# Patient Record
Sex: Male | Born: 1955 | Race: White | Hispanic: No | Marital: Married | Smoking: Never smoker
Health system: Southern US, Community
[De-identification: ages and names within clinical notes are randomized; demographics above are authoritative.]

## PROBLEM LIST (undated history)

## (undated) DIAGNOSIS — R569 Unspecified convulsions: Secondary | ICD-10-CM

## (undated) DIAGNOSIS — E119 Type 2 diabetes mellitus without complications: Secondary | ICD-10-CM

## (undated) DIAGNOSIS — M1990 Unspecified osteoarthritis, unspecified site: Secondary | ICD-10-CM

## (undated) DIAGNOSIS — M4846XA Fatigue fracture of vertebra, lumbar region, initial encounter for fracture: Secondary | ICD-10-CM

## (undated) HISTORY — PX: FRACTURE SURGERY: SHX138

## (undated) HISTORY — PX: HERNIA REPAIR: SHX51

## (undated) HISTORY — PX: OTHER SURGICAL HISTORY: SHX169

## (undated) HISTORY — DX: Unspecified convulsions: R56.9

## (undated) NOTE — *Deleted (*Deleted)
  Face-to-face evaluation   History: Patient was sent here for possible PICC line infection.  He had a fever of 103.5 yesterday.  He feels generally ill.  Yesterday, infectious disease physician, Dr. Drue Second was contacted regarding possible allergic reaction to antibiotics.  She recommended using hydroxyzine 25 mg every 6 hours as needed itching.  He has been treated as an outpatient with IV antibiotics per PICC line, for knee infection, treated with operative intervention on 08/15/2020.  The patient is using cefazolin, 3 times daily per the PICC line.  He took 2 doses yesterday, skip the evening dose, and took the morning dose today.  He states that these 3 doses were from "a new box."  He states that each time he has had progressively worse reaction, rather immediately after using the medication.  He complains of general tingling, feeling of malaise, and has noticed a progressive rash since the reactions started.  The rash has continued.  He took his first dose of hydroxyzine, this morning prior to the dose of cefazolin.  He also took 2 ibuprofen.  Physical exam: Alert, calm, cooperative.  No respiratory distress.  No dysarthria or aphasia.  He has generalized rash that is consistent with a drug eruption.  The right knee does not appear infected at this time.   Medical screening examination/treatment/procedure(s) were conducted as a shared visit with non-physician practitioner(s) and myself.  I personally evaluated the patient during the encounter

---

## 1976-11-11 HISTORY — PX: FRACTURE SURGERY: SHX138

## 2013-11-11 DIAGNOSIS — Z860100 Personal history of colon polyps, unspecified: Secondary | ICD-10-CM

## 2013-11-11 HISTORY — DX: Personal history of colon polyps, unspecified: Z86.0100

## 2013-11-11 HISTORY — DX: Personal history of colonic polyps: Z86.010

## 2014-01-10 ENCOUNTER — Ambulatory Visit (INDEPENDENT_AMBULATORY_CARE_PROVIDER_SITE_OTHER): Payer: Managed Care, Other (non HMO) | Admitting: Family Medicine

## 2014-01-10 VITALS — BP 130/82 | HR 70 | Temp 98.1°F | Resp 16 | Ht 67.5 in | Wt 258.0 lb

## 2014-01-10 DIAGNOSIS — R059 Cough, unspecified: Secondary | ICD-10-CM

## 2014-01-10 DIAGNOSIS — R05 Cough: Secondary | ICD-10-CM

## 2014-01-10 DIAGNOSIS — J069 Acute upper respiratory infection, unspecified: Secondary | ICD-10-CM

## 2014-01-10 DIAGNOSIS — H938X9 Other specified disorders of ear, unspecified ear: Secondary | ICD-10-CM

## 2014-01-10 MED ORDER — FLUTICASONE PROPIONATE 50 MCG/ACT NA SUSP: 2.0000 | Freq: Every day | NASAL | Status: DC

## 2014-01-10 MED ORDER — AMOXICILLIN-POT CLAVULANATE 875-125 MG PO TABS: 1.0000 | ORAL_TABLET | Freq: Two times a day (BID) | ORAL | Status: DC

## 2014-01-10 NOTE — Patient Instructions (Signed)

## 2014-01-10 NOTE — Progress Notes (Signed)
Chief Complaint:  Chief Complaint  Patient presents with  . Cough    x 1 month   . congestion  . Sore Throat    HPI: Jesus Nunez is a 58 y.o. male who is here for 1 month history of of congestion, sore throat, worsening sxs at night, yellow productive sputum.   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 He is no longer on drugs but does not want any narcotics. Tried otc with minimal releif.   Past Medical History  Diagnosis Date  . Seizures     2012, possibly related to drug use. He currently does not take naything for it. He declines meds and is tired.    Past Surgical History  Procedure Laterality Date  . Fracture surgery    . Hernia repair     History   Social History  . Marital Status: Married    Spouse Name: N/A    Number of Children: N/A  . Years of Education: N/A   Social History Main Topics  . Smoking status: Never Smoker   . Smokeless tobacco: None  . Alcohol Use: No  . Drug Use: No  . Sexual Activity: None   Other Topics Concern  . None   Social History Narrative  . None   Family History  Problem Relation Age of Onset  . Hyperlipidemia Mother   . Mental illness Mother    No Known Allergies Prior to Admission medications   Medication Sig Start Date End Date Taking? Authorizing Provider  Ibuprofen (ADVIL) 200 MG CAPS Take by mouth.   Yes Historical Provider, MD  Omega-3 Fatty Acids (FISH OIL BURP-LESS) 500 MG CAPS Take by mouth.   Yes Historical Provider, MD  Pseudoephedrine-Acetaminophen (SM NON-ASPRIN SINUS PO) Take by mouth.   Yes Historical Provider, MD  thiamine (VITAMIN B-1) 100 MG tablet Take 100 mg by mouth daily.   Yes Historical Provider, MD     ROS: The patient denies fevers, chills, night sweats, unintentional weight loss, chest pain, palpitations, wheezing, dyspnea on exertion, nausea, vomiting, abdominal pain, dysuria, hematuria, melena, numbness, weakness, or tingling.   All other systems have  been reviewed and were otherwise negative with the exception of those mentioned in the HPI and as above.    PHYSICAL EXAM: Filed Vitals:   01/10/14 1215  BP: 130/82  Pulse: 70  Temp: 98.1 F (36.7 C)  Resp: 16  Spo2 95% Filed Vitals:   01/10/14 1215  Height: 5' 7.5" (1.715 m)  Weight: 258 lb (117.028 kg)   Body mass index is 39.79 kg/(m^2).  General: Alert, no acute distress HEENT:  Normocephalic, atraumatic, oropharynx patent. EOMI, PERRLA. Tm bulging. Not infectious. No sinus tenerness Cardiovascular:  Regular rate and rhythm, no rubs murmurs or gallops.  No Carotid bruits, radial pulse intact. No pedal edema.  Respiratory: Clear to auscultation bilaterally.  No wheezes, rales, or rhonchi.  No cyanosis, no use of accessory musculature GI: No organomegaly, abdomen is soft and non-tender, positive bowel sounds.  No masses. Skin: No rashes. Neurologic: Facial musculature symmetric. Psychiatric: Patient is appropriate throughout our interaction. Lymphatic: No cervical lymphadenopathy Musculoskeletal: Gait intact.   LABS: No results found for this or any previous visit.   EKG/XRAY:   [facility]   ASSESSMENT/PLAN: Encounter Diagnoses  Name Primary?  . URI, acute Yes  . Cough   . Ear pressure    Ofered xfay  but declined, he will return if he has worsening sxs, he is not SOB or wheezing. VSS Rx Augmentin, Rx flonase Ot cough meds, prior history of drug use and seizures so will not give any narcotics F/u   Gross sideeffects, risk and benefits, and alternatives of medications d/w patient. Patient is aware that all medications have potential sideeffects and we are unable to predict every sideeffect or drug-drug interaction that may occur.  Hamilton Capri PHUONG, DO 01/10/2014 12:48 PM

## 2014-08-26 ENCOUNTER — Encounter: Payer: Self-pay | Admitting: Gastroenterology

## 2018-07-09 ENCOUNTER — Encounter: Payer: Self-pay | Admitting: Family Medicine

## 2018-07-09 ENCOUNTER — Ambulatory Visit (INDEPENDENT_AMBULATORY_CARE_PROVIDER_SITE_OTHER): Payer: 59 | Admitting: Family Medicine

## 2018-07-09 VITALS — BP 122/80 | HR 73 | Ht 67.5 in | Wt 247.4 lb

## 2018-07-09 DIAGNOSIS — R350 Frequency of micturition: Secondary | ICD-10-CM

## 2018-07-09 DIAGNOSIS — Z0000 Encounter for general adult medical examination without abnormal findings: Secondary | ICD-10-CM | POA: Insufficient documentation

## 2018-07-09 DIAGNOSIS — Z0001 Encounter for general adult medical examination with abnormal findings: Secondary | ICD-10-CM | POA: Diagnosis not present

## 2018-07-09 DIAGNOSIS — E119 Type 2 diabetes mellitus without complications: Secondary | ICD-10-CM

## 2018-07-09 DIAGNOSIS — N401 Enlarged prostate with lower urinary tract symptoms: Secondary | ICD-10-CM | POA: Diagnosis not present

## 2018-07-09 LAB — LIPID PANEL
Cholesterol: 141 mg/dL (ref 0–200)
HDL: 46.1 mg/dL (ref >39.00)
LDL Cholesterol: 81 mg/dL (ref 0–99)
NonHDL: 94.89
Total CHOL/HDL Ratio: 3
Triglycerides: 69 mg/dL (ref 0.0–149.0)
VLDL: 13.8 mg/dL (ref 0.0–40.0)

## 2018-07-09 LAB — URINALYSIS, ROUTINE W REFLEX MICROSCOPIC
Bilirubin Urine: NEGATIVE
Hgb urine dipstick: NEGATIVE
Ketones, ur: NEGATIVE
Leukocytes, UA: NEGATIVE
Nitrite: NEGATIVE
RBC / HPF: NONE SEEN (ref 0–?)
Specific Gravity, Urine: 1.025 (ref 1.000–1.030)
Total Protein, Urine: NEGATIVE
Urine Glucose: NEGATIVE
Urobilinogen, UA: 0.2 (ref 0.0–1.0)
pH: 5.5 (ref 5.0–8.0)

## 2018-07-09 LAB — COMPREHENSIVE METABOLIC PANEL
ALT: 20 U/L (ref 0–53)
AST: 22 U/L (ref 0–37)
Albumin: 4.3 g/dL (ref 3.5–5.2)
Alkaline Phosphatase: 68 U/L (ref 39–117)
BUN: 25 mg/dL — ABNORMAL HIGH (ref 6–23)
CO2: 29 mEq/L (ref 19–32)
Calcium: 9.5 mg/dL (ref 8.4–10.5)
Chloride: 104 mEq/L (ref 96–112)
Creatinine, Ser: 1.02 mg/dL (ref 0.40–1.50)
GFR: 78.71 mL/min (ref >60.00)
Glucose, Bld: 95 mg/dL (ref 70–99)
Potassium: 4.2 mEq/L (ref 3.5–5.1)
Sodium: 139 mEq/L (ref 135–145)
Total Bilirubin: 0.6 mg/dL (ref 0.2–1.2)
Total Protein: 7 g/dL (ref 6.0–8.3)

## 2018-07-09 LAB — PSA: PSA: 1.59 ng/mL (ref 0.10–4.00)

## 2018-07-09 LAB — MICROALBUMIN / CREATININE URINE RATIO
Creatinine,U: 118.2 mg/dL
Microalb Creat Ratio: 2.9 mg/g (ref 0.0–30.0)
Microalb, Ur: 3.4 mg/dL — ABNORMAL HIGH (ref 0.0–1.9)

## 2018-07-09 LAB — HEMOGLOBIN A1C: Hgb A1c MFr Bld: 6.2 % (ref 4.6–6.5)

## 2018-07-09 MED ORDER — METFORMIN HCL ER 500 MG PO TB24: 500.0000 mg | ORAL_TABLET | Freq: Every day | ORAL | 3 refills | Status: DC

## 2018-07-09 MED ORDER — TAMSULOSIN HCL 0.4 MG PO CAPS: 0.4000 mg | ORAL_CAPSULE | Freq: Every day | ORAL | 2 refills | Status: DC

## 2018-07-09 NOTE — Patient Instructions (Signed)
Acute Urinary Retention, Male Acute urinary retention is when you are unable to pee (urinate). Acute urinary retention is common in older men. Prostates can get bigger, which blocks the flow of pee. Follow these instructions at home:  Drink enough fluids to keep your pee clear or pale yellow.  If you are sent home with a tube that drains the bladder (catheter), there will be a drainage bag attached to it. There are two types of bags. One is big that you can wear at night without having to empty it. One is smaller and needs to be emptied more often. ? Keep the drainage bag empty. ? Keep the drainage bag lower than your catheter.  Only take medicine as told by your doctor. Contact a doctor if:  You have a low-grade fever.  You have spasms or you are leaking pee when you have spasms. Get help right away if:  You have chills or a fever.  Your catheter stops draining pee.  Your catheter falls out.  You have increased bleeding that does not stop after you have rested and increased the amount of fluids you had been drinking. This information is not intended to replace advice given to you by your health care provider. Make sure you discuss any questions you have with your health care provider. Document Released: 04/15/2008 Document Revised: 04/04/2016 Document Reviewed: 04/08/2013 Elsevier Interactive Patient Education  2017 [employer]  Diabetes Mellitus and Exercise Exercising regularly is important for your overall health, especially when you have diabetes (diabetes mellitus). Exercising is not only about losing weight. It has many health benefits, such as increasing muscle strength and bone density and reducing body fat and stress. This leads to improved fitness, flexibility, and endurance, all of which result in better overall health. Exercise has additional benefits for people with diabetes, including:  Reducing appetite.  Helping to lower and control blood glucose.  Lowering  blood pressure.  Helping to control amounts of fatty substances (lipids) in the blood, such as cholesterol and triglycerides.  Helping the body to respond better to insulin (improving insulin sensitivity).  Reducing how much insulin the body needs.  Decreasing the risk for heart disease by: ? Lowering cholesterol and triglyceride levels. ? Increasing the levels of good cholesterol. ? Lowering blood glucose levels.  What is my activity plan? Your health care provider or certified diabetes educator can help you make a plan for the type and frequency of exercise (activity plan) that works for you. Make sure that you:  Do at least 150 minutes of moderate-intensity or vigorous-intensity exercise each week. This could be brisk walking, biking, or water aerobics. ? Do stretching and strength exercises, such as yoga or weightlifting, at least 2 times a week. ? Spread out your activity over at least 3 days of the week.  Get some form of physical activity every day. ? Do not go more than 2 days in a row without some kind of physical activity. ? Avoid being inactive for more than 90 minutes at a time. Take frequent breaks to walk or stretch.  Choose a type of exercise or activity that you enjoy, and set realistic goals.  Start slowly, and gradually increase the intensity of your exercise over time.  What do I need to know about managing my diabetes?  Check your blood glucose before and after exercising. ? If your blood glucose is higher than 240 mg/dL (16.1 mmol/L) before you exercise, check your urine for ketones. If you have ketones in your  urine, do not exercise until your blood glucose returns to normal.  Know the symptoms of low blood glucose (hypoglycemia) and how to treat it. Your risk for hypoglycemia increases during and after exercise. Common symptoms of hypoglycemia can include: ? Hunger. ? Anxiety. ? Sweating and feeling clammy. ? Confusion. ? Dizziness or feeling  light-headed. ? Increased heart rate or palpitations. ? Blurry vision. ? Tingling or numbness around the mouth, lips, or tongue. ? Tremors or shakes. ? Irritability.  Keep a rapid-acting carbohydrate snack available before, during, and after exercise to help prevent or treat hypoglycemia.  Avoid injecting insulin into areas of the body that are going to be exercised. For example, avoid injecting insulin into: ? The arms, when playing tennis. ? The legs, when jogging.  Keep records of your exercise habits. Doing this can help you and your health care provider adjust your diabetes management plan as needed. Write down: ? Food that you eat before and after you exercise. ? Blood glucose levels before and after you exercise. ? The type and amount of exercise you have done. ? When your insulin is expected to peak, if you use insulin. Avoid exercising at times when your insulin is peaking.  When you start a new exercise or activity, work with your health care provider to make sure the activity is safe for you, and to adjust your insulin, medicines, or food intake as needed.  Drink plenty of water while you exercise to prevent dehydration or heat stroke. Drink enough fluid to keep your urine clear or pale yellow. This information is not intended to replace advice given to you by your health care provider. Make sure you discuss any questions you have with your health care provider. Document Released: 01/18/2004 Document Revised: 05/17/2016 Document Reviewed: 04/08/2016 Elsevier Interactive Patient Education  2018 [employer].  Health Maintenance, Male A healthy lifestyle and preventive care is important for your health and wellness. Ask your health care provider about what schedule of regular examinations is right for you. What should I know about weight and diet? Eat a Healthy Diet  Eat plenty of vegetables, fruits, whole grains, low-fat dairy products, and lean protein.  Do not eat a lot  of foods high in solid fats, added sugars, or salt.  Maintain a Healthy Weight Regular exercise can help you achieve or maintain a healthy weight. You should:  Do at least 150 minutes of exercise each week. The exercise should increase your heart rate and make you sweat (moderate-intensity exercise).  Do strength-training exercises at least twice a week.  Watch Your Levels of Cholesterol and Blood Lipids  Have your blood tested for lipids and cholesterol every 5 years starting at 62 years of age. If you are at high risk for heart disease, you should start having your blood tested when you are 62 years old. You may need to have your cholesterol levels checked more often if: ? Your lipid or cholesterol levels are high. ? You are older than 62 years of age. ? You are at high risk for heart disease.  What should I know about cancer screening? Many types of cancers can be detected early and may often be prevented. Lung Cancer  You should be screened every year for lung cancer if: ? You are a current smoker who has smoked for at least 30 years. ? You are a former smoker who has quit within the past 15 years.  Talk to your health care provider about your screening options, when  you should start screening, and how often you should be screened.  Colorectal Cancer  Routine colorectal cancer screening usually begins at 62 years of age and should be repeated every 5-10 years until you are 62 years old. You may need to be screened more often if early forms of precancerous polyps or small growths are found. Your health care provider may recommend screening at an earlier age if you have risk factors for colon cancer.  Your health care provider may recommend using home test kits to check for hidden blood in the stool.  A small camera at the end of a tube can be used to examine your colon (sigmoidoscopy or colonoscopy). This checks for the earliest forms of colorectal cancer.  Prostate and  Testicular Cancer  Depending on your age and overall health, your health care provider may do certain tests to screen for prostate and testicular cancer.  Talk to your health care provider about any symptoms or concerns you have about testicular or prostate cancer.  Skin Cancer  Check your skin from head to toe regularly.  Tell your health care provider about any new moles or changes in moles, especially if: ? There is a change in a mole's size, shape, or color. ? You have a mole that is larger than a pencil eraser.  Always use sunscreen. Apply sunscreen liberally and repeat throughout the day.  Protect yourself by wearing long sleeves, pants, a wide-brimmed hat, and sunglasses when outside.  What should I know about heart disease, diabetes, and high blood pressure?  If you are 618-62 years of age, have your blood pressure checked every 3-5 years. If you are 62 years of age or older, have your blood pressure checked every year. You should have your blood pressure measured twice-once when you are at a hospital or clinic, and once when you are not at a hospital or clinic. Record the average of the two measurements. To check your blood pressure when you are not at a hospital or clinic, you can use: ? An automated blood pressure machine at a pharmacy. ? A home blood pressure monitor.  Talk to your health care provider about your target blood pressure.  If you are between 4545-62 years old, ask your health care provider if you should take aspirin to prevent heart disease.  Have regular diabetes screenings by checking your fasting blood sugar level. ? If you are at a normal weight and have a low risk for diabetes, have this test once every three years after the age of 62. ? If you are overweight and have a high risk for diabetes, consider being tested at a younger age or more often.  A one-time screening for abdominal aortic aneurysm (AAA) by ultrasound is recommended for men aged 65-75 years  who are current or former smokers. What should I know about preventing infection? Hepatitis B If you have a higher risk for hepatitis B, you should be screened for this virus. Talk with your health care provider to find out if you are at risk for hepatitis B infection. Hepatitis C Blood testing is recommended for:  Everyone born from [id number] through 1965.  Anyone with known risk factors for hepatitis C.  Sexually Transmitted Diseases (STDs)  You should be screened each year for STDs including gonorrhea and chlamydia if: ? You are sexually active and are younger than 62 years of age. ? You are older than 62 years of age and your health care provider tells you that you are  at risk for this type of infection. ? Your sexual activity has changed since you were last screened and you are at an increased risk for chlamydia or gonorrhea. Ask your health care provider if you are at risk.  Talk with your health care provider about whether you are at high risk of being infected with HIV. Your health care provider may recommend a prescription medicine to help prevent HIV infection.  What else can I do?  Schedule regular health, dental, and eye exams.  Stay current with your vaccines (immunizations).  Do not use any tobacco products, such as cigarettes, chewing tobacco, and e-cigarettes. If you need help quitting, ask your health care provider.  Limit alcohol intake to no more than 2 drinks per day. One drink equals 12 ounces of beer, 5 ounces of wine, or 1 ounces of hard liquor.  Do not use street drugs.  Do not share needles.  Ask your health care provider for help if you need support or information about quitting drugs.  Tell your health care provider if you often feel depressed.  Tell your health care provider if you have ever been abused or do not feel safe at home. This information is not intended to replace advice given to you by your health care provider. Make sure you discuss any  questions you have with your health care provider. Document Released: 04/25/2008 Document Revised: 06/26/2016 Document Reviewed: 08/01/2015 Elsevier Interactive Patient Education  2018 [employer].  How to Increase Your Level of Physical Activity Getting regular physical activity is important for your overall health and well-being. Most people do not get enough exercise. There are easy ways to increase your level of physical activity, even if you have not been very active in the past or you are just starting out. Why is physical activity important? Physical activity has many short-term and long-term health benefits. Regular exercise can:  Help you lose weight or maintain a healthy weight.  Strengthen your muscles and bones.  Boost your mood and improve self-esteem.  Reduce your risk of certain long-term (chronic) diseases, like heart disease, cancer, and diabetes.  Help you stay capable of walking and moving around (mobile) as you age.  Prevent accidents, such as falls, as you age.  Increase life expectancy.  What are the benefits of being physically active on a regular basis? In addition to improving your physical health, being physically active on most days of the week can help you in ways that you may not expect. Benefits of regular physical activity may include:  Feeling good about your body.  Being able to move around more easily and for longer periods of time without getting tired (increased stamina).  Finding new sources of fun and enjoyment.  Meeting new people who share a common interest.  Being able to fight off illness better (enhanced immunity).  Being able to sleep better.  What can happen if I am not physically active on a regular basis? Not getting enough physical activity can lead to an unhealthy lifestyle and future health problems. This can increase your chances of:  Becoming overweight or obese.  Becoming sick.  Developing chronic illnesses, like  heart disease or diabetes.  Having mental health problems, like depression or anxiety.  Having sleep problems.  Having trouble walking or getting yourself around (reduced mobility).  Injuring yourself in a fall as you get older.  What steps can I take to be more physically active?  Check with your health care provider about how to  get started. Ask your health care provider what activities are safe for you.  Start out slowly. Walking or doing some simple chair exercises is a good place to start, especially if you have not been active before or for a long time.  Try to find activities that you enjoy. You are more likely to commit to an exercise routine if it does not feel like a chore.  If you have bone or joint problems, choose low-impact exercises, like walking or swimming.  Include physical activity in your everyday routine.  Invite friends or family members to exercise with you. This also will help you commit to your workout plan.  Set goals that you can work toward.  Aim for at least 150 minutes of moderate-intensity exercise each week. Examples of moderate-intensity exercise include walking or riding a bike. Where to find more information:  Centers for Disease Control and Prevention: JokeRule.co.uk  [employer] on [employer] www.http://smith-thompson.com/  ChooseMyPlate: MissedFlights.com.br Contact a health care provider if:  You have headaches, muscle aches, or joint pain.  You feel dizzy or light-headed while exercising.  You faint.  You have chest pain while exercising. Summary  Exercise benefits your mind and body at any age, even if you are just starting out.  If you have a chronic illness or have not been active for a while, check with your health care provider before increasing your physical activity.  Choose activities that are safe and enjoyable for you.Ask your health care provider  what activities are safe for you.  Start slowly. Tell your health care provider if you have problems as you start to increase your activity level. This information is not intended to replace advice given to you by your health care provider. Make sure you discuss any questions you have with your health care provider. Document Released: 10/17/2016 Document Revised: 10/17/2016 Document Reviewed: 10/17/2016 Elsevier Interactive Patient Education  [employer].

## 2018-07-09 NOTE — Progress Notes (Signed)
Subjective:  Patient ID: Jesus Nunez, male    DOB: 1956/08/26  Age: 62 y.o. MRN: [id number]  CC: Establish Care   HPI Kashius Dominic presents for a physical exam.  He is fasting today.  He is an established patient of mine.  Diabetes has been well controlled on 500 mg of Glucophage Exar daily.  He does admit urinary frequency and decreased force of flow with nocturia 2-3 times a night.  He continues to work full-time and lives with his wife.  He is happy that his son is home from [place] and no longer subject to sniper fire.  He does not smoke or drink alcohol or use illicit drugs.  He is not currently exercising other than and on his job.  His last colonoscopy was 5 years ago.  He is actually been able to lose some weight since I saw him last.  Outpatient Medications Prior to Visit  Medication Sig Dispense Refill  . Ibuprofen (ADVIL) 200 MG CAPS Take by mouth.    . Omega-3 Fatty Acids (FISH OIL BURP-LESS) 500 MG CAPS Take by mouth.    . metFORMIN (GLUCOPHAGE-XR) 500 MG 24 hr tablet Take 1 tablet by mouth daily.  2  . amoxicillin-clavulanate (AUGMENTIN) 875-125 MG per tablet Take 1 tablet by mouth 2 (two) times daily. 20 tablet 0  . fluticasone (FLONASE) 50 MCG/ACT nasal spray Place 2 sprays into both nostrils daily. 16 g 0  . Pseudoephedrine-Acetaminophen (SM NON-ASPRIN SINUS PO) Take by mouth.    . thiamine (VITAMIN B-1) 100 MG tablet Take 100 mg by mouth daily.     No facility-administered medications prior to visit.     ROS Review of Systems  Constitutional: Negative for chills, diaphoresis, fatigue, fever and unexpected weight change.  HENT: Negative.   Eyes: Negative for photophobia and visual disturbance.  Respiratory: Negative.   Cardiovascular: Negative.   Gastrointestinal: Negative.   Endocrine: Negative for polyphagia and polyuria.  Genitourinary: Positive for frequency. Negative for decreased urine volume, difficulty urinating and dysuria.  Musculoskeletal: Positive for  gait problem. Negative for joint swelling.  Allergic/Immunologic: Negative for immunocompromised state.  Neurological: Negative for weakness, light-headedness and headaches.  Hematological: Does not bruise/bleed easily.  Psychiatric/Behavioral: Negative.     Objective:  BP 122/80   Pulse 73   Ht 5' 7.5" (1.715 m)   Wt 247 lb 6 oz (112.2 kg)   SpO2 96%   BMI 38.17 kg/m   BP Readings from Last 3 Encounters:  07/09/18 122/80  01/10/14 130/82    Wt Readings from Last 3 Encounters:  07/09/18 247 lb 6 oz (112.2 kg)  01/10/14 258 lb (117 kg)    Physical Exam  Constitutional: He is oriented to person, place, and time. He appears well-developed and well-nourished. No distress.  HENT:  Head: Normocephalic and atraumatic.  Right Ear: External ear normal.  Left Ear: External ear normal.  Mouth/Throat: Oropharynx is clear and moist. No oropharyngeal exudate.  Eyes: Pupils are equal, round, and reactive to light. Conjunctivae and EOM are normal. Right eye exhibits no discharge. Left eye exhibits no discharge. No scleral icterus.  Neck: Neck supple. No JVD present. No tracheal deviation present. No thyromegaly present.  Cardiovascular: Normal rate, regular rhythm and normal heart sounds.  Pulmonary/Chest: Effort normal and breath sounds normal.  Abdominal: Soft. Bowel sounds are normal.  Genitourinary: Rectum normal and prostate normal. Rectal exam shows no external hemorrhoid, no internal hemorrhoid, no fissure, no mass, no tenderness, anal tone normal and guaiac  negative stool. Prostate is not enlarged and not tender.  Lymphadenopathy:    He has no cervical adenopathy.  Neurological: He is alert and oriented to person, place, and time.  Skin: Skin is warm and dry. Capillary refill takes less than 2 seconds. He is not diaphoretic.  Psychiatric: He has a normal mood and affect. His behavior is normal.    No results found for: WBC, HGB, HCT, PLT, GLUCOSE, CHOL, TRIG, HDL, LDLDIRECT,  LDLCALC, ALT, AST, NA, K, CL, CREATININE, BUN, CO2, TSH, PSA, INR, GLUF, HGBA1C, MICROALBUR  Patient was never admitted.  Assessment & Plan:   Hessie Dienerlan was seen today for establish care.  Diagnoses and all orders for this visit:  Encounter for health maintenance examination with abnormal findings -     Comprehensive metabolic panel -     Lipid panel -     PSA -     Urinalysis, Routine w reflex microscopic  Benign prostatic hyperplasia with urinary frequency -     PSA -     tamsulosin (FLOMAX) 0.4 MG CAPS capsule; Take 1 capsule (0.4 mg total) by mouth daily.  Controlled type 2 diabetes mellitus without complication, without long-term current use of insulin (HCC) -     Comprehensive metabolic panel -     Hemoglobin A1c -     Urinalysis, Routine w reflex microscopic -     Microalbumin / creatinine urine ratio -     metFORMIN (GLUCOPHAGE-XR) 500 MG 24 hr tablet; Take 1 tablet (500 mg total) by mouth daily.   I have discontinued Hessie DienerAlan Ruddick's thiamine, Pseudoephedrine-Acetaminophen (SM NON-ASPRIN SINUS PO), amoxicillin-clavulanate, and fluticasone. I have also changed his metFORMIN. Additionally, I am having him start on tamsulosin. Lastly, I am having him maintain his FISH OIL BURP-LESS and Ibuprofen.  Meds ordered this encounter  Medications  . metFORMIN (GLUCOPHAGE-XR) 500 MG 24 hr tablet    Sig: Take 1 tablet (500 mg total) by mouth daily.    Dispense:  90 tablet    Refill:  3  . tamsulosin (FLOMAX) 0.4 MG CAPS capsule    Sig: Take 1 capsule (0.4 mg total) by mouth daily.    Dispense:  90 capsule    Refill:  2   Patient was given anticipatory guidance on health promotion and maintenance.  He was also given information about exercising to lose weight and help control diabetes he agreed to give Flomax to try for his BPH symptoms.  Information was given to him also regarding acute urinary retention.  He will follow-up in 6 months or sooner as needed.  Follow-up: Return in about 6  months (around 01/09/2019).  Mliss SaxWilliam Alfred Kremer, MD

## 2018-07-10 ENCOUNTER — Encounter: Payer: Self-pay | Admitting: Family Medicine

## 2019-03-03 ENCOUNTER — Telehealth: Payer: Self-pay | Admitting: Family Medicine

## 2019-03-03 NOTE — Telephone Encounter (Signed)
I called and left message on patient voicemail to call office and schedule follow up appointment with Dr. Kremer.  °

## 2019-07-05 ENCOUNTER — Telehealth: Payer: Self-pay | Admitting: Family Medicine

## 2019-07-05 NOTE — Telephone Encounter (Signed)
I called and spoke with pt. Form has been filled out & faxed back to [facility]. Appointment not needed with [place] at this time. Pt verbalized understanding.

## 2019-07-05 NOTE — Telephone Encounter (Signed)
I have a call into Dr. Aurea Graff office to receive another copy of the surgical clearance paperwork. Will notify pt if he needs appointment once we receive the paperwork.

## 2019-07-05 NOTE — Telephone Encounter (Signed)
Patient is calling to check on pre surgical clearance paperwork that was faxed by Dr. Elliot Cousin from [facility].   Can paperwork be completed and return back or will a OV be needed?  Please advise - [phone]

## 2019-07-15 NOTE — H&P (Signed)
UNICOMPARTMENTAL KNEE ADMISSION H&P  Patient is being admitted for right medial unicompartmental knee arthroplasty.  Subjective:  Chief Complaint:  Right knee medial compartmental primary OA /pain    HPI: Jesus Nunez, 63 y.o. male , has a history of pain and functional disability in the right and has failed non-surgical conservative treatments for greater than 12 weeks to include NSAID's and/or analgesics, use of assistive devices and activity modification.  Onset of symptoms was gradual, starting  years ago with gradually worsening course since that time. The patient noted no past surgery on the right knee(s).  Patient currently rates pain in the right knee(s) at 7 out of 10 with activity. Patient has worsening of pain with activity and weight bearing, pain that interferes with activities of daily living, pain with passive range of motion, crepitus and joint swelling.  Patient has evidence of periarticular osteophytes and joint space narrowing of the medial compartment by imaging studies.  There is no active infection.  Risks, benefits and expectations were discussed with the patient.  Risks including but not limited to the risk of anesthesia, blood clots, nerve damage, blood vessel damage, failure of the prosthesis, infection and up to and including death.  Patient understand the risks, benefits and expectations and wishes to proceed with surgery.   PCP: [name], MD  D/C Plans:       Home (same day)  Post-op Meds:       No Rx given   Tranexamic Acid:      To be given - IV   Decadron:      Is to be given  FYI:      ASA  Norco  DME:   Rx for RW and 3-n-1 sent  PT:   OPPT  Pharmacy: [facility]    Past Medical History:  Diagnosis Date  . 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 He currently does not take naything for it. He declines meds and is tired.      Past Surgical History:  Procedure Laterality Date  . FRACTURE SURGERY     . HERNIA REPAIR      No Known Allergies  Social History   Tobacco Use  . Smoking status: Never Smoker  . Smokeless tobacco: Never Used  Substance Use Topics  . Alcohol use: No  . Drug use: No    Family History  Problem Relation Age of Onset  . Hyperlipidemia Mother   . Mental illness Mother      Review of Systems  Constitutional: Negative.   HENT: Negative.   Eyes: Negative.   Respiratory: Negative.   Cardiovascular: Negative.   Gastrointestinal: Negative.   Genitourinary: Negative.   Musculoskeletal: Positive for joint pain.  Skin: Negative.   Neurological: Negative.   Endo/Heme/Allergies: Negative.   Psychiatric/Behavioral: Negative.      Objective:   Physical Exam  Constitutional: He is oriented to person, place, and time. He appears well-developed.  HENT:  Head: Normocephalic.  Eyes: Pupils are equal, round, and reactive to light.  Neck: Neck supple. No JVD present. No tracheal deviation present. No thyromegaly present.  Cardiovascular: Normal rate, regular rhythm and intact distal pulses.  Respiratory: Effort normal and breath sounds normal. No respiratory distress. He has no wheezes.  GI: Soft. There is no abdominal tenderness. There is no guarding.  Musculoskeletal:     Right knee: He exhibits swelling and bony tenderness. He exhibits no ecchymosis, no deformity, no  laceration and no erythema. Tenderness found. Medial joint line tenderness noted. No lateral joint line tenderness noted.  Lymphadenopathy:    He has no cervical adenopathy.  Neurological: He is alert and oriented to person, place, and time.  Skin: Skin is warm and dry.  Psychiatric: He has a normal mood and affect.      Labs:  Estimated body mass index is 38.17 kg/m as calculated from the following:   Height as of 07/09/18: 5' 7.5" (1.715 m).   Weight as of 07/09/18: 112.2 kg.   Imaging Review Plain radiographs demonstrate severe degenerative joint disease of the right knee(s)  medial compartment.  The bone quality appears to be good for age and reported activity level.  Assessment/Plan:  End stage arthritis, right knee medial compartment  The patient history, physical examination, clinical judgment of the provider and imaging studies are consistent with end stage degenerative joint disease of the right knee(s) and medial unicompartmental knee arthroplasty is deemed medically necessary. The treatment options including medical management, injection therapy arthroscopy and arthroplasty were discussed at length. The risks and benefits of total knee arthroplasty were presented and reviewed. The risks due to aseptic loosening, infection, stiffness, patella tracking problems, thromboembolic complications and other imponderables were discussed. The patient acknowledged the explanation, agreed to proceed with the plan and consent was signed. Patient is being admitted for outpatient / observation treatment for surgery, pain control, PT, OT, prophylactic antibiotics, VTE prophylaxis, progressive ambulation and ADL's and discharge planning. The patient is planning to be discharged home.     Anastasio AuerbachMatthew S. Lachrisha Ziebarth   PA-C  07/15/2019, 10:40 PM

## 2019-07-21 ENCOUNTER — Other Ambulatory Visit: Payer: Self-pay

## 2019-07-21 DIAGNOSIS — E119 Type 2 diabetes mellitus without complications: Secondary | ICD-10-CM

## 2019-07-21 MED ORDER — METFORMIN HCL ER 500 MG PO TB24: 500.0000 mg | ORAL_TABLET | Freq: Every day | ORAL | 3 refills | Status: DC

## 2019-07-22 NOTE — Patient Instructions (Addendum)
DUE TO COVID-19 ONLY ONE VISITOR IS ALLOWED TO COME WITH YOU AND STAY IN THE WAITING ROOM ONLY DURING PRE OP AND PROCEDURE DAY OF SURGERY. THE 1 VISITOR MAY VISIT WITH YOU AFTER SURGERY IN YOUR PRIVATE ROOM DURING VISITING HOURS ONLY!  YOU NEED TO HAVE A COVID 19 TEST ON_ 07-26-2019 AT 245 PM. , THIS TEST MUST BE DONE BEFORE SURGERY, COME  [address], [place]  , [phone].  [facility]) ONCE YOUR COVID TEST IS COMPLETED, PLEASE BEGIN THE QUARANTINE INSTRUCTIONS AS OUTLINED IN YOUR HANDOUT.                Jesus Nunez    Your procedure is scheduled on: Thursday 07/29/2019   Report to [facility] Main  Entr[facility]              Report to admitting at 0610  AM     Call this number if you have problems the morning of surgery [phone]  How to Manage Your Diabetes Before and After Surgery  Why is it important to control my blood sugar before and after surgery? . Improving blood sugar levels before and after surgery helps healing and can limit problems. . A way of improving blood sugar control is eating a healthy diet by: o  Eating less sugar and carbohydrates o  Increasing activity/exercise o  Talking with your doctor about reaching your blood sugar goals . High blood sugars (greater than 180 mg/dL) can raise your risk of infections and slow your recovery, so you will need to focus on controlling your diabetes during the weeks before surgery. . Make sure that the doctor who takes care of your diabetes knows about your planned surgery including the date and location.  How do I manage my blood sugar before surgery? . Check your blood sugar at least 4 times a day, starting 2 days before surgery, to make sure that the level is not too high or low. o Check your blood sugar the morning of your surgery when you wake up and every 2 hours until you get to the Short Stay unit. . If your blood sugar is less than 70 mg/dL, you will need to treat for low blood sugar: o Do  not take insulin. o Treat a low blood sugar (less than 70 mg/dL) with  cup of clear juice (cranberry or apple), 4 glucose tablets, OR glucose gel. o Recheck blood sugar in 15 minutes after treatment (to make sure it is greater than 70 mg/dL). If your blood sugar is not greater than 70 mg/dL on recheck, call [phone] for further instructions. . Report your blood sugar to the short stay nurse when you get to Short Stay.  . If you are admitted to the hospital after surgery: o Your blood sugar will be checked by the staff and you will probably be given insulin after surgery (instead of oral diabetes medicines) to make sure you have good blood sugar levels. o The goal for blood sugar control after surgery is 80-180 mg/dL.   WHAT DO I DO ABOUT MY DIABETES MEDICATION?         The day before surgery, Take Metformin as usual.  . Do not take oral diabetes medicines (pills) the morning of surgery.     Remember: Do not eat food  :After Midnight.               NO SOLID FOOD AFTER MIDNIGHT THE NIGHT PRIOR TO SURGERY. NOTHING BY MOUTH EXCEPT  CLEAR LIQUIDS FROM MIDNIGHT UP  UNTIL   0540 am .               PLEASE FINISH G2  DRINK PER SURGEON ORDER  WHICH NEEDS TO BE COMPLETED AT  0540 am .   CLEAR LIQUID DIET   Foods Allowed                                                                     Foods Excluded  Coffee and tea, regular and decaf                             liquids that you cannot  Plain Jell-O any favor except red or purple                                           see through such as: Fruit ices (not with fruit pulp)                                     milk, soups, orange juice  Iced Popsicles                                    All solid food Carbonated beverages, regular and diet                                    Cranberry, grape and apple juices Sports drinks like Gatorade Lightly seasoned clear broth or consume(fat free) Sugar, honey syrup  Sample Menu Breakfast                                 Lunch                                     Supper Cranberry juice                    Beef broth                            Chicken broth Jell-O                                     Grape juice                           Apple juice Coffee or tea                        Jell-O  Popsicle                                                Coffee or tea                        Coffee or tea  _____________________________________________________________________                 BRUSH YOUR TEETH MORNING OF SURGERY AND RINSE YOUR MOUTH OUT, NO CHEWING GUM CANDY OR MINTS.     Take these medicines the morning of surgery with A SIP OF WATER: None              DO NOT TAKE ANY DIABETIC MEDICATIONS DAY OF YOUR SURGERY!                               You may not have any metal on your body including hair pins and              piercings  Do not wear jewelry, make-up, lotions, powders or perfumes, deodorant                        Men may shave face and neck.   Do not bring valuables to the hospital. [place].  Contacts, dentures or bridgework may not be worn into surgery.  Leave suitcase in the car. After surgery it may be brought to your room.                  Please read over the following fact sheets you were given: _____________________________________________________________________             [facility] - Preparing for Surgery Before surgery, you can play an important role.  Because skin is not sterile, your skin needs to be as free of germs as possible.  You can reduce the number of germs on your skin by washing with CHG (chlorahexidine gluconate) soap before surgery.  CHG is an antiseptic cleaner which kills germs and bonds with the skin to continue killing germs even after washing. Please DO NOT use if you have an allergy to CHG or antibacterial soaps.  If your skin becomes  reddened/irritated stop using the CHG and inform your nurse when you arrive at Short Stay. Do not shave (including legs and underarms) for at least 48 hours prior to the first CHG shower.  You may shave your face/neck. Please follow these instructions carefully:  1.  Shower with CHG Soap the night before surgery and the  morning of Surgery.  2.  If you choose to wash your hair, wash your hair first as usual with your  normal  shampoo.  3.  After you shampoo, rinse your hair and body thoroughly to remove the  shampoo.                           4.  Use CHG as you would any other liquid soap.  You can apply chg directly  to the skin and wash  Gently with a scrungie or clean washcloth.  5.  Apply the CHG Soap to your body ONLY FROM THE NECK DOWN.   Do not use on face/ open                           Wound or open sores. Avoid contact with eyes, ears mouth and genitals (private parts).                       Wash face,  Genitals (private parts) with your normal soap.             6.  Wash thoroughly, paying special attention to the area where your surgery  will be performed.  7.  Thoroughly rinse your body with warm water from the neck down.  8.  DO NOT shower/wash with your normal soap after using and rinsing off  the CHG Soap.                9.  Pat yourself dry with a clean towel.            10.  Wear clean pajamas.            11.  Place clean sheets on your bed the night of your first shower and do not  sleep with pets. Day of Surgery : Do not apply any lotions/deodorants the morning of surgery.  Please wear clean clothes to the hospital/surgery center.  FAILURE TO FOLLOW THESE INSTRUCTIONS MAY RESULT IN THE CANCELLATION OF YOUR SURGERY PATIENT SIGNATURE_________________________________  NURSE SIGNATURE__________________________________  ________________________________________________________________________   Adam Phenix  An incentive spirometer is a tool that  can help keep your lungs clear and active. This tool measures how well you are filling your lungs with each breath. Taking long deep breaths may help reverse or decrease the chance of developing breathing (pulmonary) problems (especially infection) following:  A long period of time when you are unable to move or be active. BEFORE THE PROCEDURE   If the spirometer includes an indicator to show your best effort, your nurse or respiratory therapist will set it to a desired goal.  If possible, sit up straight or lean slightly forward. Try not to slouch.  Hold the incentive spirometer in an upright position. INSTRUCTIONS FOR USE  1. Sit on the edge of your bed if possible, or sit up as far as you can in bed or on a chair. 2. Hold the incentive spirometer in an upright position. 3. Breathe out normally. 4. Place the mouthpiece in your mouth and seal your lips tightly around it. 5. Breathe in slowly and as deeply as possible, raising the piston or the ball toward the top of the column. 6. Hold your breath for 3-5 seconds or for as long as possible. Allow the piston or ball to fall to the bottom of the column. 7. Remove the mouthpiece from your mouth and breathe out normally. 8. Rest for a few seconds and repeat Steps 1 through 7 at least 10 times every 1-2 hours when you are awake. Take your time and take a few normal breaths between deep breaths. 9. The spirometer may include an indicator to show your best effort. Use the indicator as a goal to work toward during each repetition. 10. After each set of 10 deep breaths, practice coughing to be sure your lungs are clear. If you have an incision (the cut made at the time of surgery),  support your incision when coughing by placing a pillow or rolled up towels firmly against it. Once you are able to get out of bed, walk around indoors and cough well. You may stop using the incentive spirometer when instructed by your caregiver.  RISKS AND  COMPLICATIONS  Take your time so you do not get dizzy or light-headed.  If you are in pain, you may need to take or ask for pain medication before doing incentive spirometry. It is harder to take a deep breath if you are having pain. AFTER USE  Rest and breathe slowly and easily.  It can be helpful to keep track of a log of your progress. Your caregiver can provide you with a simple table to help with this. If you are using the spirometer at home, follow these instructions: [place] IF:   You are having difficultly using the spirometer.  You have trouble using the spirometer as often as instructed.  Your pain medication is not giving enough relief while using the spirometer.  You develop fever of 100.5 F (38.1 C) or higher. SEEK IMMEDIATE MEDICAL CARE IF:   You cough up bloody sputum that had not been present before.  You develop fever of 102 F (38.9 C) or greater.  You develop worsening pain at or near the incision site. MAKE SURE YOU:   Understand these instructions.  Will watch your condition.  Will get help right away if you are not doing well or get worse. Document Released: 03/10/2007 Document Revised: 01/20/2012 Document Reviewed: 05/11/2007 Exit[employer] Patient Information 2014 Exit[employer], [place].   ________________________________________________________________________  WHAT IS A BLOOD TRANSFUSION? Blood Transfusion Information  A transfusion is the replacement of blood or some of its parts. Blood is made up of multiple cells which provide different functions.  Red blood cells carry oxygen and are used for blood loss replacement.  White blood cells fight against infection.  Platelets control bleeding.  Plasma helps clot blood.  Other blood products are available for specialized needs, such as hemophilia or other clotting disorders. BEFORE THE TRANSFUSION  Who gives blood for transfusions?   Healthy volunteers who are fully evaluated to make sure  their blood is safe. This is blood bank blood. Transfusion therapy is the safest it has ever been in the practice of medicine. Before blood is taken from a donor, a complete history is taken to make sure that person has no history of diseases nor engages in risky social behavior (examples are intravenous drug use or sexual activity with multiple partners). The donor's travel history is screened to minimize risk of transmitting infections, such as malaria. The donated blood is tested for signs of infectious diseases, such as HIV and hepatitis. The blood is then tested to be sure it is compatible with you in order to minimize the chance of a transfusion reaction. If you or a relative donates blood, this is often done in anticipation of surgery and is not appropriate for emergency situations. It takes many days to process the donated blood. RISKS AND COMPLICATIONS Although transfusion therapy is very safe and saves many lives, the main dangers of transfusion include:   Getting an infectious disease.  Developing a transfusion reaction. This is an allergic reaction to something in the blood you were given. Every precaution is taken to prevent this. The decision to have a blood transfusion has been considered carefully by your caregiver before blood is given. Blood is not given unless the benefits outweigh the risks. AFTER THE TRANSFUSION  Right after receiving a blood transfusion, you will usually feel much better and more energetic. This is especially true if your red blood cells have gotten low (anemic). The transfusion raises the level of the red blood cells which carry oxygen, and this usually causes an energy increase.  The nurse administering the transfusion will monitor you carefully for complications. HOME CARE INSTRUCTIONS  No special instructions are needed after a transfusion. You may find your energy is better. Speak with your caregiver about any limitations on activity for underlying diseases  you may have. SEEK MEDICAL CARE IF:   Your condition is not improving after your transfusion.  You develop redness or irritation at the intravenous (IV) site. SEEK IMMEDIATE MEDICAL CARE IF:  Any of the following symptoms occur over the next 12 hours:  Shaking chills.  You have a temperature by mouth above 102 F (38.9 C), not controlled by medicine.  Chest, back, or muscle pain.  People around you feel you are not acting correctly or are confused.  Shortness of breath or difficulty breathing.  Dizziness and fainting.  You get a rash or develop hives.  You have a decrease in urine output.  Your urine turns a dark color or changes to pink, red, or brown. Any of the following symptoms occur over the next 10 days:  You have a temperature by mouth above 102 F (38.9 C), not controlled by medicine.  Shortness of breath.  Weakness after normal activity.  The white part of the eye turns yellow (jaundice).  You have a decrease in the amount of urine or are urinating less often.  Your urine turns a dark color or changes to pink, red, or brown. Document Released: 10/25/2000 Document Revised: 01/20/2012 Document Reviewed: 06/13/2008 [facility] Patient Information 2014 [place], [place].  _______________________________________________________________________

## 2019-07-23 ENCOUNTER — Encounter (HOSPITAL_COMMUNITY)
Admission: RE | Admit: 2019-07-23 | Discharge: 2019-07-23 | Disposition: A | Discharge status: Home / Self Care | Payer: 59 | Source: Ambulatory Visit | Attending: Orthopedic Surgery | Admitting: Orthopedic Surgery

## 2019-07-23 ENCOUNTER — Encounter (HOSPITAL_COMMUNITY): Payer: Self-pay

## 2019-07-23 ENCOUNTER — Other Ambulatory Visit: Payer: Self-pay

## 2019-07-23 DIAGNOSIS — E119 Type 2 diabetes mellitus without complications: Secondary | ICD-10-CM | POA: Insufficient documentation

## 2019-07-23 DIAGNOSIS — M1711 Unilateral primary osteoarthritis, right knee: Secondary | ICD-10-CM | POA: Insufficient documentation

## 2019-07-23 DIAGNOSIS — Z01818 Encounter for other preprocedural examination: Secondary | ICD-10-CM | POA: Insufficient documentation

## 2019-07-23 DIAGNOSIS — R001 Bradycardia, unspecified: Secondary | ICD-10-CM | POA: Diagnosis not present

## 2019-07-23 HISTORY — DX: Fatigue fracture of vertebra, lumbar region, initial encounter for fracture: M48.46XA

## 2019-07-23 HISTORY — DX: Unspecified osteoarthritis, unspecified site: M19.90

## 2019-07-23 HISTORY — DX: Type 2 diabetes mellitus without complications: E11.9

## 2019-07-23 LAB — BASIC METABOLIC PANEL
Anion gap: 6 (ref 5–15)
BUN: 30 mg/dL — ABNORMAL HIGH (ref 8–23)
CO2: 27 mmol/L (ref 22–32)
Calcium: 9.3 mg/dL (ref 8.9–10.3)
Chloride: 106 mmol/L (ref 98–111)
Creatinine, Ser: 1.07 mg/dL (ref 0.61–1.24)
GFR calc Af Amer: >60 mL/min (ref >60)
GFR calc non Af Amer: >60 mL/min (ref >60)
Glucose, Bld: 89 mg/dL (ref 70–99)
Potassium: 4.2 mmol/L (ref 3.5–5.1)
Sodium: 139 mmol/L (ref 135–145)

## 2019-07-23 LAB — GLUCOSE, CAPILLARY: Glucose-Capillary: 88 mg/dL (ref 70–99)

## 2019-07-23 LAB — CBC
HCT: 50.1 % (ref 39.0–52.0)
Hemoglobin: 16.4 g/dL (ref 13.0–17.0)
MCH: 30.1 pg (ref 26.0–34.0)
MCHC: 32.7 g/dL (ref 30.0–36.0)
MCV: 91.9 fL (ref 80.0–100.0)
Platelets: 253 10*3/uL (ref 150–400)
RBC: 5.45 MIL/uL (ref 4.22–5.81)
RDW: 13.2 % (ref 11.5–15.5)
WBC: 7.6 10*3/uL (ref 4.0–10.5)
nRBC: 0 % (ref 0.0–0.2)

## 2019-07-23 LAB — SURGICAL PCR SCREEN
MRSA, PCR: NEGATIVE
Staphylococcus aureus: POSITIVE — AB

## 2019-07-23 NOTE — Progress Notes (Signed)
PCP - DR Abelino Derrick Cardiologist - NONE  Chest x-ray - NONE EKG - 07-23-19 EPIC Stress Test - NONE ECHO - NONE Cardiac Cath - NONE  Sleep Study - NONE CPAP - NONE  Fasting Blood Sugar -  Checks Blood Sugar - DOES NOT CHECK CBG  Blood Thinner Instructions: Aspirin Instructions: 81 MG Last Dose:07-21-19 PER DR Alvan Dame INSTRUCTIONS  Anesthesia review:   Patient denies shortness of breath, fever, cough and chest pain at PAT appointment   Patient verbalized understanding of instructions that were given to them at the PAT appointment. Patient was also instructed that they will need to review over the PAT instructions again at home before surgery.

## 2019-07-24 LAB — ABO/RH: ABO/RH(D): O POS

## 2019-07-24 LAB — HEMOGLOBIN A1C
Hgb A1c MFr Bld: 6 % — ABNORMAL HIGH (ref 4.8–5.6)
Mean Plasma Glucose: 126 mg/dL

## 2019-07-26 ENCOUNTER — Other Ambulatory Visit (HOSPITAL_COMMUNITY)
Admission: RE | Admit: 2019-07-26 | Discharge: 2019-07-26 | Disposition: A | Discharge status: Home / Self Care | Payer: 59 | Source: Ambulatory Visit | Attending: Orthopedic Surgery | Admitting: Orthopedic Surgery

## 2019-07-26 DIAGNOSIS — Z01812 Encounter for preprocedural laboratory examination: Secondary | ICD-10-CM | POA: Diagnosis present

## 2019-07-26 DIAGNOSIS — Z20828 Contact with and (suspected) exposure to other viral communicable diseases: Secondary | ICD-10-CM | POA: Diagnosis not present

## 2019-07-27 LAB — NOVEL CORONAVIRUS, NAA (HOSP ORDER, SEND-OUT TO REF LAB; TAT 18-24 HRS): SARS-CoV-2, NAA: NOT DETECTED

## 2019-07-28 NOTE — Anesthesia Preprocedure Evaluation (Addendum)
Anesthesia Evaluation  Patient identified by MRN, date of birth, ID band Patient awake    Reviewed: Allergy & Precautions, NPO status , Patient's Chart, lab work & pertinent test results  History of Anesthesia Complications Negative for: history of anesthetic complications  Airway Mallampati: II  TM Distance: >3 FB Neck ROM: Full    Dental  (+) Dental Advisory Given, Loose   Pulmonary neg pulmonary ROS,    Pulmonary exam normal        Cardiovascular negative cardio ROS Normal cardiovascular exam     Neuro/Psych Seizures -, Well Controlled,  negative psych ROS   GI/Hepatic negative GI ROS, Neg liver ROS,   Endo/Other  diabetes, Type 2, Oral Hypoglycemic Agents Obesity   Renal/GU negative Renal ROS     Musculoskeletal  (+) Arthritis ,  Hx lumbar stress fracture    Abdominal (+) + obese,   Peds  Hematology negative hematology ROS (+)   Anesthesia Other Findings   Reproductive/Obstetrics                            Anesthesia Physical Anesthesia Plan  ASA: II  Anesthesia Plan: Spinal   Post-op Pain Management:  Regional for Post-op pain   Induction:   PONV Risk Score and Plan: 1 and Treatment may vary due to age or medical condition and Propofol infusion  Airway Management Planned: Natural Airway and Simple Face Mask  Additional Equipment: None  Intra-op Plan:   Post-operative Plan:   Informed Consent: I have reviewed the patients History and Physical, chart, labs and discussed the procedure including the risks, benefits and alternatives for the proposed anesthesia with the patient or authorized representative who has indicated his/her understanding and acceptance.       Plan Discussed with: CRNA and Anesthesiologist  Anesthesia Plan Comments: (Labs reviewed, platelets acceptable. Discussed risks and benefits of spinal, including spinal/epidural hematoma, infection,  failed block, and PDPH. Patient expressed understanding and wished to proceed. )       Anesthesia Quick Evaluation

## 2019-07-29 ENCOUNTER — Encounter (HOSPITAL_COMMUNITY)
Admission: RE | Disposition: A | Discharge status: Home / Self Care | Payer: Self-pay | Source: Other Acute Inpatient Hospital | Attending: Orthopedic Surgery

## 2019-07-29 ENCOUNTER — Other Ambulatory Visit: Payer: Self-pay

## 2019-07-29 ENCOUNTER — Ambulatory Visit (HOSPITAL_COMMUNITY)
Admission: RE | Admit: 2019-07-29 | Discharge: 2019-07-29 | Disposition: A | Discharge status: Home / Self Care | Payer: 59 | Source: Other Acute Inpatient Hospital | Attending: Orthopedic Surgery | Admitting: Orthopedic Surgery

## 2019-07-29 ENCOUNTER — Ambulatory Visit (HOSPITAL_COMMUNITY): Payer: 59 | Admitting: Physician Assistant

## 2019-07-29 ENCOUNTER — Encounter (HOSPITAL_COMMUNITY): Payer: Self-pay | Admitting: Registered Nurse

## 2019-07-29 ENCOUNTER — Ambulatory Visit (HOSPITAL_COMMUNITY): Payer: 59 | Admitting: Anesthesiology

## 2019-07-29 DIAGNOSIS — M1711 Unilateral primary osteoarthritis, right knee: Secondary | ICD-10-CM | POA: Diagnosis not present

## 2019-07-29 DIAGNOSIS — E669 Obesity, unspecified: Secondary | ICD-10-CM | POA: Diagnosis not present

## 2019-07-29 DIAGNOSIS — Z7984 Long term (current) use of oral hypoglycemic drugs: Secondary | ICD-10-CM | POA: Insufficient documentation

## 2019-07-29 DIAGNOSIS — Z6838 Body mass index (BMI) 38.0-38.9, adult: Secondary | ICD-10-CM | POA: Insufficient documentation

## 2019-07-29 DIAGNOSIS — E119 Type 2 diabetes mellitus without complications: Secondary | ICD-10-CM | POA: Insufficient documentation

## 2019-07-29 DIAGNOSIS — Z96651 Presence of right artificial knee joint: Secondary | ICD-10-CM

## 2019-07-29 HISTORY — PX: PARTIAL KNEE ARTHROPLASTY: SHX2174

## 2019-07-29 LAB — TYPE AND SCREEN
ABO/RH(D): O POS
Antibody Screen: NEGATIVE

## 2019-07-29 LAB — GLUCOSE, CAPILLARY: Glucose-Capillary: 123 mg/dL — ABNORMAL HIGH (ref 70–99)

## 2019-07-29 SURGERY — ARTHROPLASTY, KNEE, UNICOMPARTMENTAL
Anesthesia: Spinal | Site: Knee | Laterality: Right

## 2019-07-29 MED ORDER — POVIDONE-IODINE 10 % EX SWAB
2.0000 "application " | Freq: Once | CUTANEOUS | Status: AC
  Administered 2019-07-29: 2 via TOPICAL

## 2019-07-29 MED ORDER — METHOCARBAMOL 500 MG PO TABS: 500.0000 mg | ORAL_TABLET | Freq: Four times a day (QID) | ORAL | 0 refills | Status: DC | PRN

## 2019-07-29 MED ORDER — DOCUSATE SODIUM 100 MG PO CAPS: 100.0000 mg | ORAL_CAPSULE | Freq: Two times a day (BID) | ORAL | 0 refills | Status: DC

## 2019-07-29 MED ORDER — SODIUM CHLORIDE (PF) 0.9 % IJ SOLN
INTRAMUSCULAR | Status: DC | PRN
  Administered 2019-07-29: 30 mL

## 2019-07-29 MED ORDER — DEXAMETHASONE SODIUM PHOSPHATE 10 MG/ML IJ SOLN: 10.0000 mg | Freq: Once | INTRAMUSCULAR | Status: DC

## 2019-07-29 MED ORDER — KETOROLAC TROMETHAMINE 30 MG/ML IJ SOLN
INTRAMUSCULAR | Status: AC
  Filled 2019-07-29: qty 1

## 2019-07-29 MED ORDER — MIDAZOLAM HCL 2 MG/2ML IJ SOLN
INTRAMUSCULAR | Status: AC
  Filled 2019-07-29: qty 2

## 2019-07-29 MED ORDER — FENTANYL CITRATE (PF) 100 MCG/2ML IJ SOLN
INTRAMUSCULAR | Status: AC
  Filled 2019-07-29: qty 2

## 2019-07-29 MED ORDER — SODIUM CHLORIDE (PF) 0.9 % IJ SOLN
INTRAMUSCULAR | Status: AC
  Filled 2019-07-29: qty 30

## 2019-07-29 MED ORDER — OXYCODONE HCL 5 MG PO TABS: 5.0000 mg | ORAL_TABLET | Freq: Once | ORAL | Status: DC | PRN

## 2019-07-29 MED ORDER — OXYCODONE HCL 5 MG/5ML PO SOLN: 5.0000 mg | Freq: Once | ORAL | Status: DC | PRN

## 2019-07-29 MED ORDER — FENTANYL CITRATE (PF) 100 MCG/2ML IJ SOLN
50.0000 ug | INTRAMUSCULAR | Status: DC
  Administered 2019-07-29: 50 ug via INTRAVENOUS
  Filled 2019-07-29: qty 2

## 2019-07-29 MED ORDER — 0.9 % SODIUM CHLORIDE (POUR BTL) OPTIME
TOPICAL | Status: DC | PRN
  Administered 2019-07-29: 1000 mL

## 2019-07-29 MED ORDER — CHLORHEXIDINE GLUCONATE 4 % EX LIQD: 60.0000 mL | Freq: Once | CUTANEOUS | Status: DC

## 2019-07-29 MED ORDER — FERROUS SULFATE 325 (65 FE) MG PO TABS: 325.0000 mg | ORAL_TABLET | Freq: Three times a day (TID) | ORAL | 0 refills | Status: DC

## 2019-07-29 MED ORDER — CEFAZOLIN SODIUM-DEXTROSE 2-4 GM/100ML-% IV SOLN
2.0000 g | INTRAVENOUS | Status: AC
  Administered 2019-07-29: 2 g via INTRAVENOUS
  Filled 2019-07-29: qty 100

## 2019-07-29 MED ORDER — HYDROCODONE-ACETAMINOPHEN 7.5-325 MG PO TABS: 1.0000 | ORAL_TABLET | ORAL | 0 refills | Status: DC | PRN

## 2019-07-29 MED ORDER — BUPIVACAINE-EPINEPHRINE (PF) 0.5% -1:200000 IJ SOLN
INTRAMUSCULAR | Status: AC
  Filled 2019-07-29: qty 1.8

## 2019-07-29 MED ORDER — VANCOMYCIN HCL 1000 MG IV SOLR
INTRAVENOUS | Status: DC | PRN
  Administered 2019-07-29: 1000 mg

## 2019-07-29 MED ORDER — MIDAZOLAM HCL 2 MG/2ML IJ SOLN
1.0000 mg | INTRAMUSCULAR | Status: DC
  Administered 2019-07-29: 2 mg via INTRAVENOUS
  Filled 2019-07-29: qty 2

## 2019-07-29 MED ORDER — KETOROLAC TROMETHAMINE 30 MG/ML IJ SOLN
INTRAMUSCULAR | Status: DC | PRN
  Administered 2019-07-29: 30 mg via INTRAVENOUS

## 2019-07-29 MED ORDER — ONDANSETRON HCL 4 MG/2ML IJ SOLN
INTRAMUSCULAR | Status: DC | PRN
  Administered 2019-07-29: 4 mg via INTRAVENOUS

## 2019-07-29 MED ORDER — HYDROCODONE-ACETAMINOPHEN 7.5-325 MG PO TABS: 1.0000 | ORAL_TABLET | Freq: Four times a day (QID) | ORAL | Status: DC | PRN

## 2019-07-29 MED ORDER — FENTANYL CITRATE (PF) 100 MCG/2ML IJ SOLN: 25.0000 ug | INTRAMUSCULAR | Status: DC | PRN

## 2019-07-29 MED ORDER — BUPIVACAINE IN DEXTROSE 0.75-8.25 % IT SOLN
INTRATHECAL | Status: DC | PRN
  Administered 2019-07-29: 1.6 mL via INTRATHECAL

## 2019-07-29 MED ORDER — PROMETHAZINE HCL 25 MG/ML IJ SOLN: 6.2500 mg | INTRAMUSCULAR | Status: DC | PRN

## 2019-07-29 MED ORDER — PROPOFOL 500 MG/50ML IV EMUL
INTRAVENOUS | Status: DC | PRN
  Administered 2019-07-29: 25 ug/kg/min via INTRAVENOUS

## 2019-07-29 MED ORDER — PROPOFOL 10 MG/ML IV BOLUS
INTRAVENOUS | Status: AC
  Filled 2019-07-29: qty 80

## 2019-07-29 MED ORDER — POLYETHYLENE GLYCOL 3350 17 G PO PACK: 17.0000 g | PACK | Freq: Two times a day (BID) | ORAL | 0 refills | Status: DC

## 2019-07-29 MED ORDER — HYDROMORPHONE HCL 1 MG/ML IJ SOLN: 0.5000 mg | INTRAMUSCULAR | Status: DC | PRN

## 2019-07-29 MED ORDER — STERILE WATER FOR IRRIGATION IR SOLN
Status: DC | PRN
  Administered 2019-07-29: 2000 mL

## 2019-07-29 MED ORDER — DEXAMETHASONE SODIUM PHOSPHATE 10 MG/ML IJ SOLN
INTRAMUSCULAR | Status: DC | PRN
  Administered 2019-07-29: 10 mg via INTRAVENOUS

## 2019-07-29 MED ORDER — ASPIRIN 81 MG PO CHEW: 81.0000 mg | CHEWABLE_TABLET | Freq: Two times a day (BID) | ORAL | 0 refills | Status: AC

## 2019-07-29 MED ORDER — BUPIVACAINE-EPINEPHRINE 0.5% -1:200000 IJ SOLN
INTRAMUSCULAR | Status: AC
  Filled 2019-07-29: qty 1

## 2019-07-29 MED ORDER — LIDOCAINE 2% (20 MG/ML) 5 ML SYRINGE
INTRAMUSCULAR | Status: AC
  Filled 2019-07-29: qty 5

## 2019-07-29 MED ORDER — TRANEXAMIC ACID-NACL 1000-0.7 MG/100ML-% IV SOLN
1000.0000 mg | Freq: Once | INTRAVENOUS | Status: AC
  Administered 2019-07-29: 1000 mg via INTRAVENOUS
  Filled 2019-07-29: qty 100

## 2019-07-29 MED ORDER — DEXAMETHASONE SODIUM PHOSPHATE 10 MG/ML IJ SOLN
INTRAMUSCULAR | Status: AC
  Filled 2019-07-29: qty 1

## 2019-07-29 MED ORDER — ONDANSETRON HCL 4 MG/2ML IJ SOLN
INTRAMUSCULAR | Status: AC
  Filled 2019-07-29: qty 2

## 2019-07-29 MED ORDER — VANCOMYCIN HCL 1000 MG IV SOLR
INTRAVENOUS | Status: AC
  Filled 2019-07-29: qty 1000

## 2019-07-29 MED ORDER — SODIUM CHLORIDE 0.9 % IV BOLUS
250.0000 mL | Freq: Once | INTRAVENOUS | Status: AC
  Administered 2019-07-29: 250 mL via INTRAVENOUS

## 2019-07-29 MED ORDER — LIDOCAINE 2% (20 MG/ML) 5 ML SYRINGE
INTRAMUSCULAR | Status: DC | PRN
  Administered 2019-07-29: 60 mg via INTRAVENOUS

## 2019-07-29 MED ORDER — ROPIVACAINE HCL 7.5 MG/ML IJ SOLN
INTRAMUSCULAR | Status: DC | PRN
  Administered 2019-07-29: 20 mL via PERINEURAL

## 2019-07-29 MED ORDER — LACTATED RINGERS IV SOLN
INTRAVENOUS | Status: DC
  Administered 2019-07-29 (×2): via INTRAVENOUS

## 2019-07-29 MED ORDER — TRANEXAMIC ACID-NACL 1000-0.7 MG/100ML-% IV SOLN
1000.0000 mg | INTRAVENOUS | Status: AC
  Administered 2019-07-29: 1000 mg via INTRAVENOUS
  Filled 2019-07-29: qty 100

## 2019-07-29 MED ORDER — MIDAZOLAM HCL 5 MG/5ML IJ SOLN
INTRAMUSCULAR | Status: DC | PRN
  Administered 2019-07-29 (×2): 1 mg via INTRAVENOUS

## 2019-07-29 MED ORDER — BUPIVACAINE-EPINEPHRINE 0.5% -1:200000 IJ SOLN
INTRAMUSCULAR | Status: DC | PRN
  Administered 2019-07-29: 30 mL

## 2019-07-29 MED ORDER — FENTANYL CITRATE (PF) 100 MCG/2ML IJ SOLN
INTRAMUSCULAR | Status: DC | PRN
  Administered 2019-07-29 (×2): 50 ug via INTRAVENOUS

## 2019-07-29 SURGICAL SUPPLY — 48 items
BAG ZIPLOCK 12X15 (MISCELLANEOUS) IMPLANT
BEARING MENISCAL TIBIAL 4 MD R (Orthopedic Implant) ×2 IMPLANT
BLADE SAW RECIPROCATING 77.5 (BLADE) ×3 IMPLANT
BLADE SAW SGTL 13.0X1.19X90.0M (BLADE) ×3 IMPLANT
BNDG ELASTIC 6X15 VLCR STRL LF (GAUZE/BANDAGES/DRESSINGS) ×2 IMPLANT
BNDG ELASTIC 6X5.8 VLCR STR LF (GAUZE/BANDAGES/DRESSINGS) ×3 IMPLANT
BOWL SMART MIX CTS (DISPOSABLE) ×3 IMPLANT
CEMENT BONE R 1X40 (Cement) ×3 IMPLANT
COMPONENT TIBIAL OXFRD MEDL RT (Orthopedic Implant) IMPLANT
COVER SURGICAL LIGHT HANDLE (MISCELLANEOUS) ×3 IMPLANT
COVER WAND RF STERILE (DRAPES) IMPLANT
CUFF TOURN SGL QUICK 34 (TOURNIQUET CUFF) ×2
CUFF TRNQT CYL 34X4.125X (TOURNIQUET CUFF) ×1 IMPLANT
DECANTER SPIKE VIAL GLASS SM (MISCELLANEOUS) ×6 IMPLANT
DERMABOND ADVANCED (GAUZE/BANDAGES/DRESSINGS) ×2
DERMABOND ADVANCED .7 DNX12 (GAUZE/BANDAGES/DRESSINGS) ×1 IMPLANT
DRAPE U-SHAPE 47X51 STRL (DRAPES) ×3 IMPLANT
DRESSING AQUACEL AG SP 3.5X10 (GAUZE/BANDAGES/DRESSINGS) ×1 IMPLANT
DRSG AQUACEL AG SP 3.5X10 (GAUZE/BANDAGES/DRESSINGS) ×3
DURAPREP 26ML APPLICATOR (WOUND CARE) ×3 IMPLANT
ELECT REM PT RETURN 15FT ADLT (MISCELLANEOUS) ×3 IMPLANT
GLOVE BIOGEL PI IND STRL 7.5 (GLOVE) ×1 IMPLANT
GLOVE BIOGEL PI IND STRL 8.5 (GLOVE) ×1 IMPLANT
GLOVE BIOGEL PI INDICATOR 7.5 (GLOVE) ×2
GLOVE BIOGEL PI INDICATOR 8.5 (GLOVE) ×2
GLOVE ECLIPSE 8.0 STRL XLNG CF (GLOVE) ×3 IMPLANT
GLOVE ORTHO TXT STRL SZ7.5 (GLOVE) ×6 IMPLANT
GOWN STRL REUS W/TWL 2XL LVL3 (GOWN DISPOSABLE) ×3 IMPLANT
GOWN STRL REUS W/TWL LRG LVL3 (GOWN DISPOSABLE) ×3 IMPLANT
HOLDER FOLEY CATH W/STRAP (MISCELLANEOUS) ×2 IMPLANT
KIT TURNOVER KIT A (KITS) IMPLANT
LEGGING LITHOTOMY PAIR STRL (DRAPES) ×3 IMPLANT
MANIFOLD NEPTUNE II (INSTRUMENTS) ×3 IMPLANT
NDL SAFETY ECLIPSE 18X1.5 (NEEDLE) ×1 IMPLANT
NEEDLE HYPO 18GX1.5 SHARP (NEEDLE) ×2
PACK ICE MAXI GEL EZY WRAP (MISCELLANEOUS) ×1 IMPLANT
PACK TOTAL KNEE CUSTOM (KITS) ×3 IMPLANT
PEG TWIN FEM CEMENTED MED (Knees) ×2 IMPLANT
SUT MNCRL AB 4-0 PS2 18 (SUTURE) ×3 IMPLANT
SUT STRATAFIX PDS+ 0 24IN (SUTURE) ×3 IMPLANT
SUT VIC AB 1 CT1 36 (SUTURE) ×3 IMPLANT
SUT VIC AB 2-0 CT1 27 (SUTURE) ×4
SUT VIC AB 2-0 CT1 TAPERPNT 27 (SUTURE) ×2 IMPLANT
SYR 3ML LL SCALE MARK (SYRINGE) ×3 IMPLANT
TIBIAL OXFORD MEDIAL RT (Orthopedic Implant) ×3 IMPLANT
TRAY FOLEY MTR SLVR 16FR STAT (SET/KITS/TRAYS/PACK) ×3 IMPLANT
WRAP KNEE MAXI GEL POST OP (GAUZE/BANDAGES/DRESSINGS) ×2 IMPLANT
YANKAUER SUCT BULB TIP NO VENT (SUCTIONS) ×3 IMPLANT

## 2019-07-29 NOTE — Interval H&P Note (Signed)
History and Physical Interval Note:  07/29/2019 7:13 AM  Jesus Nunez  has presented today for surgery, with the diagnosis of Right knee medial osteoarthritis.  The various methods of treatment have been discussed with the patient and family. After consideration of risks, benefits and other options for treatment, the patient has consented to  Procedure(s) with comments: UNICOMPARTMENTAL KNEE Medially (Right) - 90 mins as a surgical intervention.  The patient's history has been reviewed, patient examined, no change in status, stable for surgery.  I have reviewed the patient's chart and labs.  Questions were answered to the patient's satisfaction.     Mauri Pole

## 2019-07-29 NOTE — Transfer of Care (Signed)
Immediate Anesthesia Transfer of Care Note  Patient: Estanislao Harmon  Procedure(s) Performed: UNICOMPARTMENTAL KNEE Medially (Right Knee)  Patient Location: PACU  Anesthesia Type:Spinal  Level of Consciousness: awake, alert  and oriented  Airway & Oxygen Therapy: Patient Spontanous Breathing and Patient connected to face mask oxygen  Post-op Assessment: Report given to RN and Post -op Vital signs reviewed and stable  Post vital signs: Reviewed and stable  Last Vitals:  Vitals Value Taken Time  BP    Temp    Pulse 67 07/29/19 1035  Resp 10 07/29/19 1035  SpO2 99 % 07/29/19 1035  Vitals shown include unvalidated device data.  Last Pain:  Vitals:   07/29/19 0643  TempSrc: Oral  PainSc:       Patients Stated Pain Goal: 4 (20/60/15 6153)  Complications: No apparent anesthesia complications

## 2019-07-29 NOTE — Progress Notes (Signed)
Assisted Dr. Brock with right, ultrasound guided, adductor canal block. Side rails up, monitors on throughout procedure. See vital signs in flow sheet. Tolerated Procedure well.  

## 2019-07-29 NOTE — Anesthesia Procedure Notes (Signed)
Spinal  Patient location during procedure: OR Start time: 07/29/2019 8:25 AM End time: 07/29/2019 8:30 AM Staffing Anesthesiologist: Audry Pili, MD Resident/CRNA: Talbot Grumbling, CRNA Performed: resident/CRNA  Preanesthetic Checklist Completed: patient identified, surgical consent, pre-op evaluation, timeout performed, IV checked, risks and benefits discussed and monitors and equipment checked Spinal Block Patient position: sitting Prep: DuraPrep Patient monitoring: heart rate, cardiac monitor, continuous pulse ox and blood pressure Approach: midline Location: L3-4 Injection technique: single-shot Needle Needle type: Pencan  Needle gauge: 24 G Needle length: 9 cm Assessment Sensory level: T4 Additional Notes Clear CSF, no paresthesia, patient tolerated well.

## 2019-07-29 NOTE — Anesthesia Procedure Notes (Signed)
Anesthesia Regional Block: Adductor canal block   Pre-Anesthetic Checklist: ,, timeout performed, Correct Patient, Correct Site, Correct Laterality, Correct Procedure, Correct Position, site marked, Risks and benefits discussed,  Surgical consent,  Pre-op evaluation,  At surgeon's request and post-op pain management  Laterality: Right  Prep: chloraprep       Needles:  Injection technique: Single-shot  Needle Type: Echogenic Needle     Needle Length: 10cm  Needle Gauge: 21     Additional Needles:   Narrative:  Start time: 07/29/2019 7:57 AM End time: 07/29/2019 8:01 AM Injection made incrementally with aspirations every 5 mL.  Performed by: Personally  Anesthesiologist: Audry Pili, MD  Additional Notes: No pain on injection. No increased resistance to injection. Injection made in 5cc increments. Good needle visualization. Patient tolerated the procedure well.

## 2019-07-29 NOTE — Op Note (Signed)
NAME: Jesus Nunez    MEDICAL RECORD NO.: [id number]   FACILITY: [facility] OF BIRTH: [birth year]  PHYSICIAN: Pietro Cassis. Alvan Dame, M.D.    DATE OF PROCEDURE: 07/29/2019    OPERATIVE REPORT   PREOPERATIVE DIAGNOSIS: Right knee medial compartment osteoarthritis.   POSTOPERATIVE DIAGNOSIS: Right knee medial compartment osteoarthritis.   PROCEDURE: Right partial knee replacement utilizing Biomet Oxford knee  component, size MEDIUM femur, a right medial size B tibial tray with a size 4 mm insert.   SURGEON: Pietro Cassis. Alvan Dame, M.D.   ASSISTANT: Danae Orleans, PAC.  Please note that Mr. Guinevere Scarlet was present for the entirety of the case,  utilized for preoperative positioning, perioperative retractor  management, general facilitation of the case and primary wound closure.   ANESTHESIA: regional and spinal.   SPECIMENS: None.   COMPLICATIONS: None.  DRAINS: None   TOURNIQUET TIME: 37 minutes at 250 mmHg.   INDICATIONS FOR PROCEDURE: The patient is a 63 y.o. patient of mine who presented for evaluation of right knee pain.  They presented with primary complaints of pain on the medial side of their knee. Radiographs revealed advanced medial compartment arthritis with specifically an antero-medial wear pattern.  There was bone on bone changes noted with subchondral sclerosis and osteophytes present. The patient has had progressive problems failing to respond to conservative measures of medications, injections and activity modification. Risks of infection, DVT, component failure, need for future revision surgery were all discussed and reviewed.  Consent was obtained for benefit of pain relief.   PROCEDURE IN DETAIL: The patient was brought to the operative theater.  Once adequate anesthesia, preoperative antibiotics, 2 gm of Ancef administered, 1 gm of Tranexamic Acid, and 10 mg of Decadron given the patient was positioned in supine position with a rigt thigh tourniquet placed. The operative leg  was positioned over the Oxford leg holder such to allow for 120 degrees of flexion. The right lower extremity was prepped and draped in sterile fashion. A time-out  was performed identifying the patient, planned procedure, and extremity.  The operative leg was exsanguinated and the tourniquet elevated to 250 mmHg. A midline  incision was made from the proximal pole of the patella to the tibial tubercle. A  soft tissue plane was created and partial median arthrotomy was then  made to allow for subluxation of the patella. Following initial synovectomy and  debridement, the osteophytes were removed off the medial aspect of the knee and within the notch as needed.  The femur was first sized using the sizing spoons and determine to be a medium femur.  The femoral spoon was then left in place to be attached to the extra medullary tibial guide.  The tibial extramedullary guide was positioned over the anterior crest of the tibia  and pinned into position, perpendicular in the coronal plane with appropriate slope.  Utilizing the size 4 G clamp I connected the cutting block tray to the femoral spine.  Upon confirming the orientation of the cutting block as it pertained to the extra medullary guide the tibial tray was pinned in place.  I then used a reciprocating saw along the medial border of the medial tibial spine.  The reciprocating saw was then used to complete the proximal tibial osteotomy.  The proximal tibial bone was removed and identified to have complete loss of cartilage and anterior medial pattern.  The cut surface of the tibia was found to be best covered with the B tibial tray.  At this point  I checked the tension of the medial collateral ligament at 90 degrees.  With the retractors out of the wound and the knee held at 90 degrees the 4 feeler gauge had appropriate tension on the medial ligament.   At this point, the femoral canal was opened with a 6 mm drill followed by the starting awl and then  the  intramedullary was positioned within the canal.  Then the medium posterior cutting block guide set at 4 to match the flexion gap was positioned onto the cut surface of the tibia and then linked to the intramedullary rod using the articulating link.  This posterior cutting block guide was positioned over the middle portion of the medial femoral condyle in line with the normal kinematic motion of the knee.  The 2 drill holes were then made into the distal femur.  Once this was done the posterior cutting block guide and length were removed.    The posterior guide was then impacted into place on the distal femur and the posterior  femoral cut made.   At this point, based on the tension on the medial collateral ligament at 90 degrees as previously assessed I selected the size 4 spigot and pushed it to the distal femur.  I then milled the distal femur.  Excess of bone from the distal femur and then medially and laterally were removed using osteotome and rondure.  We now did our first trial reduction with the medium femur and the B tibial tray.  I now assessed the ligament balancing at 90 and 20 degrees of flexion.   The tension of the medial collateral ligament at 90 and 20 degrees of flexion was symmetric with the 4 feeler gauge.  Given these findings, the trial femoral component was removed. Final preparation of tibia was carried out by pinning it in position. Then  using a reciprocating saw I removed bone for the keel. Further bone was  removed with an osteotome.  Trial reduction was now carried out with the medium femur, the right medial keeled B tibia, and the size 4 lollipop insert. The balance of the  ligaments appeared to be symmetric at 20 degrees and 90 degrees. Given  all these findings, the trial components were removed.  The final components were opened. The knee was irrigated with  normal saline solution.  The medial synovial capsule junction of the knee was injected with 30 cc of  quarter percent Marcaine with epinephrine, 1 cc of Toradol, and 30 cc of normal saline.  Any final debridements of the soft tissue or osteophytes was carried out at this point.  Sclerotic bone on the distal femur and proximal tibia were drilled with the drill and the set.    The final components were cemented onto a clean and dried cut surfaces of bone. Excessive cement was removed and the knee was then held at 45 degrees of flexion with the size 4 feeler gauge until the cement cured. Excess cement was removed throughout the knee. Tourniquet was let down  after 37 minutes. After the cement had fully cured and excessive cement  was removed throughout the knee there was no visualized cement present.  Before selecting the final insert re-trialed with the 4 lollipop insert.  Given that these findings confirmed our initial trial reduction we selected the final size 4 mm, right medial insert to match the MEDIUM femur.  Following irrigation of the knee this final insert was snapped into place.   The extensor mechanism  was then  reapproximated using a #1 Vicryl and #1 Stratafix suture with the knee in flexion. The  remaining wound was closed with 2-0 Vicryl and a running 4-0 Monocryl.  The knee was cleaned, dried, and dressed sterilely using Dermabond and  Aquacel dressing. The patient  was brought to the recovery room, Ace wrap in place, tolerating the  procedure well.     Madlyn Frankel Charlann Boxer, M.D.

## 2019-07-29 NOTE — Evaluation (Signed)
Physical Therapy Evaluation Patient Details Name: Jesus Nunez MRN: [id number] DOB: 07-08-1956 Today's Date: 07/29/2019   History of Present Illness  Pt s/p R UKR  Clinical Impression  Pt s/p R UKR and presents with decreased R LE strength/ROM and post op pain limiting functional mobility.  Pt mobilizing at min guard/sup level and educated on home therex program with written instruction provided.  Pt eager for dc home this date and has first OP PT scheduled for Monday 08/02/19.    Follow Up Recommendations Follow surgeon's recommendation for DC plan and follow-up therapies    Equipment Recommendations  None recommended by PT    Recommendations for Other Services       Precautions / Restrictions Precautions Precautions: Knee;Fall Restrictions Weight Bearing Restrictions: No      Mobility  Bed Mobility Overal bed mobility: Modified Independent                Transfers Overall transfer level: Needs assistance Equipment used: Rolling walker (2 wheeled) Transfers: Sit to/from Stand Sit to Stand: Supervision         General transfer comment: cues for safety and use of UEs to self assist  Ambulation/Gait Ambulation/Gait assistance: Min guard;Supervision Gait Distance (Feet): 100 Feet Assistive device: Rolling walker (2 wheeled) Gait Pattern/deviations: Step-to pattern;Decreased step length - right;Decreased step length - left;Shuffle;Trunk flexed     General Gait Details: cues for posture, position from RW, sequence and stride length  Stairs Stairs: Yes Stairs assistance: Min guard Stair Management: One rail Left;Step to pattern;Forwards;With crutches Number of Stairs: 3 General stair comments: cues for sequence and foot/crutch placement  Wheelchair Mobility    Modified Rankin (Stroke Patients Only)       Balance Overall balance assessment: Mild deficits observed, not formally tested                                            Pertinent Vitals/Pain Pain Assessment: 0-10 Pain Score: 3  Pain Location: R knee Pain Descriptors / Indicators: Sore Pain Intervention(s): Limited activity within patient's tolerance;Monitored during session;Premedicated before session    Home Living Family/patient expects to be discharged to:: Private residence Living Arrangements: Spouse/significant other;Children Available Help at Discharge: Family Type of Home: House Home Access: Stairs to enter Entrance Stairs-Rails: Left Entrance Stairs-Number of Steps: 3 Home Layout: One level Home Equipment: Walker - standard;Cane - single point;Crutches;Bedside commode;Shower seat      Prior Function Level of Independence: Independent               Hand Dominance        Extremity/Trunk Assessment   Upper Extremity Assessment Upper Extremity Assessment: Overall WFL for tasks assessed    Lower Extremity Assessment Lower Extremity Assessment: RLE deficits/detail RLE Deficits / Details: 3/5 quads with AAROM at knee -3 - 110       Communication   Communication: No difficulties  Cognition Arousal/Alertness: Awake/alert Behavior During Therapy: WFL for tasks assessed/performed Overall Cognitive Status: Within Functional Limits for tasks assessed                                        General Comments      Exercises Total Joint Exercises Ankle Circles/Pumps: AROM;Both;15 reps;Supine Quad Sets: AROM;Both;10 reps;Supine Heel Slides: AAROM;Right;15 reps;Supine Straight Leg Raises: AAROM;AROM;Right;10  reps;Supine Long Arc Quad: Right;AROM;10 reps;Seated Knee Flexion: AAROM;AROM;Right;5 reps;Seated   Assessment/Plan    PT Assessment Patient needs continued PT services  PT Problem List Decreased strength;Decreased range of motion;Decreased activity tolerance;Decreased mobility;Decreased knowledge of use of DME;Obesity;Pain       PT Treatment Interventions DME instruction;Gait training;Stair  training;Functional mobility training;Therapeutic activities;Therapeutic exercise;Patient/family education    PT Goals (Current goals can be found in the Care Plan section)  Acute Rehab PT Goals Patient Stated Goal: HOME PT Goal Formulation: All assessment and education complete, DC therapy    Frequency Min 1X/week   Barriers to discharge        Co-evaluation               AM-PAC PT "6 Clicks" Mobility  Outcome Measure Help needed turning from your back to your side while in a flat bed without using bedrails?: None Help needed moving from lying on your back to sitting on the side of a flat bed without using bedrails?: None Help needed moving to and from a bed to a chair (including a wheelchair)?: A Little Help needed standing up from a chair using your arms (e.g., wheelchair or bedside chair)?: A Little Help needed to walk in hospital room?: A Little Help needed climbing 3-5 steps with a railing? : A Little 6 Click Score: 20    End of Session Equipment Utilized During Treatment: Gait belt Activity Tolerance: Patient tolerated treatment well Patient left: in bed;with call bell/phone within reach;with nursing/sitter in room Nurse Communication: Mobility status PT Visit Diagnosis: Difficulty in walking, not elsewhere classified (R26.2)    Time: [id number] PT Time Calculation (min) (ACUTE ONLY): 31 min   Charges:   PT Evaluation $PT Eval Low Complexity: 1 Low PT Treatments $Therapeutic Exercise: 8-22 mins        Mauro KaufmannHunter Karrington Studnicka PT Acute Rehabilitation Services Pager [phone] Office [phone]   Chloe Bluett 07/29/2019, 4:00 PM

## 2019-07-29 NOTE — Discharge Instructions (Signed)

## 2019-07-29 NOTE — Anesthesia Postprocedure Evaluation (Signed)
Anesthesia Post Note  Patient: Jesus Nunez  Procedure(s) Performed: UNICOMPARTMENTAL KNEE Medially (Right Knee)     Patient location during evaluation: PACU Anesthesia Type: Spinal Level of consciousness: awake and alert Pain management: pain level controlled Vital Signs Assessment: post-procedure vital signs reviewed and stable Respiratory status: spontaneous breathing and respiratory function stable Cardiovascular status: blood pressure returned to baseline and stable Postop Assessment: spinal receding and no apparent nausea or vomiting Anesthetic complications: no    Last Vitals:  Vitals:   07/29/19 1130 07/29/19 1142  BP:  114/74  Pulse: 61 64  Resp: 14 16  Temp: (!) 36.4 C (!) 36.3 C  SpO2: 94% 92%    Last Pain:  Vitals:   07/29/19 1130  TempSrc:   PainSc: 0-No pain                 Audry Pili

## 2019-07-29 NOTE — Anesthesia Procedure Notes (Signed)
Date/Time: 07/29/2019 8:31 AM Performed by: Talbot Grumbling, CRNA Oxygen Delivery Method: Simple face mask

## 2019-07-30 ENCOUNTER — Encounter (HOSPITAL_COMMUNITY): Payer: Self-pay | Admitting: Orthopedic Surgery

## 2019-08-02 ENCOUNTER — Ambulatory Visit: Payer: 59 | Admitting: Physical Therapy

## 2019-08-04 ENCOUNTER — Encounter: Payer: Self-pay | Admitting: Physical Therapy

## 2019-08-04 ENCOUNTER — Other Ambulatory Visit: Payer: Self-pay

## 2019-08-04 ENCOUNTER — Ambulatory Visit: Payer: 59 | Attending: Family Medicine | Admitting: Physical Therapy

## 2019-08-04 DIAGNOSIS — M25661 Stiffness of right knee, not elsewhere classified: Secondary | ICD-10-CM | POA: Insufficient documentation

## 2019-08-04 DIAGNOSIS — R2689 Other abnormalities of gait and mobility: Secondary | ICD-10-CM | POA: Insufficient documentation

## 2019-08-04 DIAGNOSIS — M6281 Muscle weakness (generalized): Secondary | ICD-10-CM | POA: Insufficient documentation

## 2019-08-04 DIAGNOSIS — R600 Localized edema: Secondary | ICD-10-CM | POA: Diagnosis present

## 2019-08-04 DIAGNOSIS — R262 Difficulty in walking, not elsewhere classified: Secondary | ICD-10-CM | POA: Insufficient documentation

## 2019-08-04 DIAGNOSIS — M25561 Pain in right knee: Secondary | ICD-10-CM | POA: Diagnosis not present

## 2019-08-04 NOTE — Therapy (Signed)
[facility] [address]  [address] [place], [place], [address] Phone: [phone]   Fax:  [phone]  Physical Therapy Evaluation  Patient Details  Name: Jesus Nunez MRN: [id number] Date of Birth: 1956/03/26 Referring Provider (PT): Lanney Gins, PA-C [name], MD)   Encounter Date: 08/04/2019  PT End of Session - 08/04/19 0936    Visit Number  1    Number of Visits  20    Date for PT Re-Evaluation  09/29/19    Authorization Type  [employer] - VL: 60    Authorization - Number of Visits  60    PT Start Time  0936   Pt arrived late - stated he was sitting in main lobby and did not know he needed to go up to 2nd floor   PT Stop Time  1023    PT Time Calculation (min)  47 min    Activity Tolerance  Patient limited by pain;Treatment limited secondary to medical complications (Comment);Other (comment)    Behavior During Therapy  Restless   easily distracted with patient reporting cognitive impairment (including hallucinations) from pain meds      Past Medical History:  Diagnosis Date  . Arthritis    OA  . Diabetes mellitus without complication (HCC)    TYPE 2  . Lumbar stress fracture AGE 40   NO SX DONE, HEALED  . 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 He currently does not take naything for it. He declines meds and is tired.     Past Surgical History:  Procedure Laterality Date  . FRACTURE SURGERY  1978   RIGHT SHOULDER PINNING  . HERNIA REPAIR     UMBILICAL 2017, GROIN FEB 1975  . PARTIAL KNEE ARTHROPLASTY Right 07/29/2019   Procedure: UNICOMPARTMENTAL KNEE Medially;  Surgeon: [name], MD;  Location: [facility];  Service: Orthopedics;  Laterality: Right;  90 mins  . RIGHT SHOULDER PINS REMOVED      There were no vitals filed for this visit.   Subjective Assessment - 08/04/19 0945    Subjective  Pt reporting worsening pain, swelling (pitting edema) and redness over past few days - feels like  he is unable to bear full weight on R LE. States he has left a Clinical cytogeneticist message for MD but has not heard back. Cancelled initially scheduled PT eval on Monday due to severe pain.    Pertinent History  07/29/19 - R unicompartmental knee replacement    Limitations  Sitting;Standing;Walking;House hold activities    How long can you sit comfortably?  30 minutes    How long can you stand comfortably?  has to put all weight on L leg    How long can you walk comfortably?  barely room to room with crutches    Patient Stated Goals  "to get back to normal"    Currently in Pain?  Yes    Pain Score  4    up to 9-10/10 at worst   Pain Location  Knee    Pain Orientation  Right;Anterior    Pain Descriptors / Indicators  Tightness;Pressure   severe   Pain Type  Acute pain;Surgical pain    Pain Radiating Towards  n/a    Pain Onset  In the past 7 days    Pain Frequency  Constant    Aggravating Factors   transfers, walking    Pain Relieving Factors  codeine, ice    Effect of Pain  on Daily Activities  cannot put full weight on R LE when walking         [facility] Assessment - 08/04/19 0936      Assessment   Medical Diagnosis  R unicompartmental knee replacement    Referring Provider (PT)  Lanney GinsMatthew Babish, PA-C [name](Matthew Olin, MD)    Onset Date/Surgical Date  07/29/19    Next MD Visit  08/12/19      Precautions   Precautions  Fall;Knee      Restrictions   Weight Bearing Restrictions  Yes    RLE Weight Bearing  Weight bearing as tolerated      Balance Screen   Has the patient fallen in the past 6 months  Yes    How many times?  1    Has the patient had a decrease in activity level because of a fear of falling?   No    Is the patient reluctant to leave their home because of a fear of falling?   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    Leisure  working in yard      Cognition   Overall Cognitive Status  Impaired/Different from baseline   feels disoriented from pain meds   Area of Impairment  Attention;Memory    Current Attention Level  Alternating    Attention Comments  pt requiring frequent redirection to PT questions and/or instructions for testing    Memory  Decreased recall of precautions;Decreased short-term memory    Memory Comments  pt repeatedly asking PT's name at least 4 times during eval      Observation/Other Assessments   Focus on Therapeutic Outcomes (FOTO)   defered today due to apparent cognitive impairment - will attempt on next visit      ROM / Strength   AROM / PROM / Strength  AROM;Strength      AROM   AROM Assessment Site  Knee    Right/Left Knee  Right;Left    Right Knee Extension  11   29 dg quad lag on SLR   Right Knee Flexion  54    Left Knee Extension  0    Left Knee Flexion  132      Strength   Overall Strength Comments  MMT deferred d/t high pain ratings and limited tolerance to touch/pressure on leg      Palpation   Palpation comment  extensive pitting edema with extreme ttp t/o R knee upper calf & lower thigh      Ambulation/Gait   Ambulation/Gait  Yes    Ambulation/Gait Assistance  5: Supervision    Ambulation/Gait Assistance Details  Cues for step-through pattern moving crutches in time with R LE and attempting to increase weight bearing on R at tolerated.    Ambulation Distance (Feet)  80 Feet    Assistive device  Crutches    Gait Pattern  Antalgic;Step-to pattern;Decreased weight shift to right;Decreased stance time - right;Decreased hip/knee flexion -  right;Right flexed knee in stance   essentially hop-to on L  with minimal to no WB'ing on R   Ambulation Surface  Level;Indoor                Objective measurements completed on examination: See above findings.      [facility] Adult PT Treatment/Exercise - 08/04/19 0936      Self-Care   Self-Care  Heat/Ice Application;Other Self-Care Comments    Other Self-Care Comments   Educated pt on proper positioning when elevating R LE to reduce risk for knee flexion contracture as well as performing ankle pumps and quad sets while LE elevated to reduce edema and encourage knee extension ROM.      Exercises   Exercises  Knee/Hip      Knee/Hip Exercises: Seated   Heel Slides  Right;AAROM;5 reps    Heel Slides Limitations  Educated pt on use of towel/plastic bag to reduce friction on floor/carpet as well as use of opposite LE for AAROM stretch at end range of motion.      Knee/Hip Exercises: Supine   Quad Sets  Right;5 reps   5" hold   Straight Leg Raises  Right;5 reps;AROM    Straight Leg Raises Limitations  cues for quad set prior to initiation of lift             PT Education - 08/04/19 1020    Education Details  PT eval findings, anticipated POC, gait training with crutches, review of proper positioning of knee to reduce risk for flexion contracture; Need for f/u with MD office d/t excessive pain, edema & redness - appt made with Danae Orleans, PA-C for 2:45 today    Person(s) Educated  Patient    Methods  Explanation;Other (comment)   appt card for MD office appt with Danae Orleans, PA-C at 2:45 today   Comprehension  Verbalized understanding       PT Short Term Goals - 08/04/19 1023      PT SHORT TERM GOAL #1   Title  Independent with initial HEP    Status  New    Target Date  08/18/19      PT SHORT TERM GOAL #2   Title  Patient will ambulate with crutches or LRAD with normal step through pattern and increased weight shift to R    Status  New    Target Date  08/18/19        PT Long Term Goals - 08/04/19 1023      PT  LONG TERM GOAL #1   Title  Independent with ongoing/advanced HEP    Status  New    Target Date  09/29/19      PT LONG TERM GOAL #2   Title  R knee AROM >/= 3-120 dg to promote normal gait and stair mechanics    Status  New    Target Date  09/29/19      PT LONG TERM GOAL #3   Title  R LE strength grossly >/= 4+/5 with no quad laq on SLR for improved stability    Status  New    Target Date  09/29/19      PT LONG TERM GOAL #4   Title  Patient will ambulate without AD with normal gait pattern on all surfaces    Status  New    Target Date  09/29/19      PT LONG TERM GOAL #5   Title  Patient wil negotiate stairs with normal reciprocal  step pattern to increase ease of access to all level of home    Status  New    Target Date  09/29/19             Plan - 08/04/19 1023    Clinical Impression Statement  Jesus Nunez is a 63 y/o male who presents to OP PT 6 days s/p R medial unicompartmental knee replacement on 07/29/2019. He reports worsening R knee pain and swelling since surgery to the point where he cannot tolerate weight bearing on the R LE. Significant erythema with pitting edema and extreme ttp observed throughout R anterior knee and upper calf with edema extending proximally into thigh. Patient reports he has sent a MyChart message to MD office regarding pain but has not had a response as of this morning, therefore contacted MD office to inform them of PT findings - appt scheduled with Lanney Gins, PA-C for 2:45 this afternoon. Pain causing severely altered gait pattern with patient demonstrating essentially a hop-to pattern on crutches with minimal to no weight bearing on R LE - instruction provided in step-to/through pattern moving crutches in time with R LE and attempting to increased WBAT on R. R knee AROM severely limited due to pain and edema with current ROM only 11-54 dg supine (59 dg in sitting with AAROM) and 29 dg quad lag evident with SLR. Formal strength testing deferred due to  high pain levels, limited activity tolerance and mild cognitive impairment from pain meds. Deficits limit activity and walking tolerance, require frequent change of position and interfere with sleep. Jesus Nunez will benefit from skilled PT intervention to address the above listed deficits, reduce pain, and restore functional ROM and strength to allow for improved knee stability for improved balance and gait to maximize function and mobility.    Personal Factors and Comorbidities  Behavior Pattern;Comorbidity 3+;Past/Current Experience    Comorbidities  OA, DM, h/o lumbar compression fracture, R shoulder fracture s/p pinning, h/o seizures    Examination-Activity Limitations  Bathing;Bed Mobility;Bend;Caring for Others;Carry;Locomotion Level;Sit;Sleep;Squat;Stairs;Stand;Transfers;Toileting    Examination-Participation Restrictions  Community Activity;Driving;Other   work   Conservation officer, historic buildings  Evolving/Moderate complexity    Clinical Decision Making  Moderate    Rehab Potential  Good    PT Frequency  3x / week   3x/wk x 3-4 weeks, tapering to 2x/wk for duration of POC   PT Duration  8 weeks   6-8 wks   PT Treatment/Interventions  ADLs/Self Care Home Management;Cryotherapy;Electrical Stimulation;Gait training;Stair training;Functional mobility training;Therapeutic activities;Therapeutic exercise;Balance training;Neuromuscular re-education;Patient/family education;Manual techniques;Scar mobilization;Passive range of motion;Dry needling;Taping;Vasopneumatic Device;Joint Manipulations    PT Next Visit Plan  FOTO; LE strength assessment as tolerated; review of proper gait pattern with crutches; review/update of hospital HEP for R knee ROM & LE strengthening; modalities PRN for pain & edema    Recommended Other Services  called MD office re: excessive pain, edema and redness - appt with Lanney Gins, PA-C at 2:45 today    Consulted and Agree with Plan of Care  Patient       Patient will  benefit from skilled therapeutic intervention in order to improve the following deficits and impairments:  Abnormal gait, Decreased activity tolerance, Decreased balance, Decreased endurance, Decreased knowledge of precautions, Decreased knowledge of use of DME, Decreased mobility, Decreased range of motion, Decreased safety awareness, Decreased scar mobility, Decreased strength, Difficulty walking, Increased edema, Impaired perceived functional ability, Impaired flexibility, Improper body mechanics, Postural dysfunction, Pain  Visit Diagnosis: Acute pain of right knee  Stiffness of right  knee, not elsewhere classified  Localized edema  Muscle weakness (generalized)  Other abnormalities of gait and mobility  Difficulty in walking, not elsewhere classified     Problem List Patient Active Problem List   Diagnosis Date Noted  . S/P right UKR 07/29/2019  . Encounter for health maintenance examination with abnormal findings 07/09/2018  . Benign prostatic hyperplasia with urinary frequency 07/09/2018  . Controlled type 2 diabetes mellitus without complication, without long-term current use of insulin (HCC) 07/09/2018    Marry Guan, PT, MPT 08/04/2019, 1:14 PM  [facility] [address]  [address] [place], [place], [address] Phone: [phone]   Fax:  [phone]  Name: Jesus Nunez MRN: [id number] Date of Birth: May 17, 1956

## 2019-08-06 ENCOUNTER — Other Ambulatory Visit: Payer: Self-pay

## 2019-08-06 ENCOUNTER — Encounter: Payer: Self-pay | Admitting: Physical Therapy

## 2019-08-06 ENCOUNTER — Ambulatory Visit: Payer: 59 | Admitting: Physical Therapy

## 2019-08-06 DIAGNOSIS — M6281 Muscle weakness (generalized): Secondary | ICD-10-CM

## 2019-08-06 DIAGNOSIS — M25561 Pain in right knee: Secondary | ICD-10-CM | POA: Diagnosis not present

## 2019-08-06 DIAGNOSIS — R600 Localized edema: Secondary | ICD-10-CM

## 2019-08-06 DIAGNOSIS — R262 Difficulty in walking, not elsewhere classified: Secondary | ICD-10-CM

## 2019-08-06 DIAGNOSIS — M25661 Stiffness of right knee, not elsewhere classified: Secondary | ICD-10-CM

## 2019-08-06 DIAGNOSIS — R2689 Other abnormalities of gait and mobility: Secondary | ICD-10-CM

## 2019-08-06 NOTE — Therapy (Signed)
[facility] [address]  [place], [place], [address] Phone: [phone]   Fax:  [phone]  Physical Therapy Treatment  Patient Details  Name: Jesus Nunez MRN: [id number] Date of Birth: [birth year] Referring Provider (PT): Danae Orleans, PA-C Paralee Cancel, MD)   Encounter Date: 08/06/2019  PT End of Session - 08/06/19 1055    Visit Number  2    Number of Visits  20    Date for PT Re-Evaluation  09/29/19    Authorization Type  [employer] - VL: 28    Authorization - Number of Visits  4    PT Start Time  1055    PT Stop Time  1159    PT Time Calculation (min)  64 min    Activity Tolerance  Patient tolerated treatment well    Behavior During Therapy  [facility] for tasks assessed/performed       Past Medical History:  Diagnosis Date  . Arthritis    OA  . Diabetes mellitus without complication ([place])    TYPE 2  . Lumbar stress fracture AGE 6   NO SX DONE, HEALED  . Seizures ([place])    2012, possibly related to drug use. He currently does not take naything for it. He declines meds and is tired.     Past Surgical History:  Procedure Laterality Date  . FRACTURE SURGERY  1978   RIGHT SHOULDER PINNING  . HERNIA REPAIR     UMBILICAL 3818, GROIN FEB 1975  . PARTIAL KNEE ARTHROPLASTY Right 07/29/2019   Procedure: UNICOMPARTMENTAL KNEE Medially;  Surgeon: Paralee Cancel, MD;  Location: [facility];  Service: Orthopedics;  Laterality: Right;  90 mins  . RIGHT SHOULDER PINS REMOVED      There were no vitals filed for this visit.  Subjective Assessment - 08/06/19 1059    Subjective  Pt reporting he saw PA for surgeon Wed pm - started on antibiotics and feels like they are just starting to kick in. Pain continues to worsen as the day progresses and R leg remains very swollen.    Pertinent History  07/29/19 - R unicompartmental knee replacement    Patient Stated Goals  "to get back to normal"    Currently in Pain?  Yes    Pain Score   5     Pain Location  Knee    Pain Orientation  Right;Anterior    Pain Descriptors / Indicators  Tightness;Pressure    Pain Type  Acute pain;Surgical pain    Pain Frequency  Constant         [facility] PT Assessment - 08/06/19 1055      Observation/Other Assessments   Focus on Therapeutic Outcomes (FOTO)   Knee - 38% (62% limitation); Predicted 60% (40% limitation)      Strength   Overall Strength Comments  tested in sitting    Strength Assessment Site  Hip;Knee    Right/Left Hip  Right;Left    Right Hip Flexion  3+/5    Right Hip ABduction  4-/5    Right Hip ADduction  3+/5    Left Hip Flexion  5/5    Left Hip ABduction  5/5    Left Hip ADduction  5/5    Right/Left Knee  Right;Left    Right Knee Flexion  4-/5    Right Knee Extension  3+/5    Left Knee Flexion  5/5    Left Knee Extension  5/5  [name] Treatment/Exercise - 08/06/19 1055      Ambulation/Gait   Ambulation/Gait Assistance  5: Supervision    Ambulation/Gait Assistance Details  Provided repeat instruction in gait training with step through pattern with crutches WBAT on R.    Ambulation Distance (Feet)  180 Feet    Assistive device  Crutches    Gait Pattern  Antalgic;Step-to pattern;Decreased weight shift to right;Decreased stance time - right;Decreased hip/knee flexion - right;Right flexed knee in stance    Ambulation Surface  Level;Indoor    Gait Comments  Patient able to demonstrate better step-though pattern today with increased WB'ing on R, although still with uneven stride length.      Self-Care   Self-Care  Heat/Ice Application    Heat/Ice Application  Educated patient on need for barrier btw ice pack and skin as pt reports using ice packs provided from surgery w/o sleeve.      Exercises   Exercises  Knee/Hip      Knee/Hip Exercises: Stretches   Passive Hamstring Stretch  Right;30 seconds;2 reps    Passive Hamstring Stretch Limitations  seated hip hinge       Knee/Hip Exercises: Aerobic   Nustep  L2 x 6 min (UE/LE)      Knee/Hip Exercises: Seated   Heel Slides  Right;10 reps;AAROM    Heel Slides Limitations  foot on plastic bag with overpressure from L foot    Other Seated Knee/Hip Exercises  R foot prop on chair fro knee extension with quad set 10 x 5"      Knee/Hip Exercises: Supine   Quad Sets  Right;10 reps;AROM;Strengthening   5" hold   Short Arc Quad Sets  Right;10 reps;AROM;Strengthening    Short Arc Quad Sets Limitations  8" bolster under knee    Heel Slides  Right;10 reps;AAROM    Heel Slides Limitations  strap assist with towel under heel    Hip Adduction Isometric  Both;10 reps;Strengthening    Hip Adduction Isometric Limitations  hooklying    Straight Leg Raises  Right;10 reps;AROM;Strengthening    Straight Leg Raises Limitations  cues for quad set prior to initiation of lift    Knee Flexion  Both;Right;AAROM;10 reps    Knee Flexion Limitations  HS curls with heels on peanut ball      Modalities   Modalities  Vasopneumatic      Vasopneumatic   Number Minutes Vasopneumatic   15 minutes    Vasopnuematic Location   Knee   R - LE elevated on wedge   Vasopneumatic Pressure  Low    Vasopneumatic Temperature   34 dg             PT Education - 08/06/19 1159    Education Details  Step-through gait pattern; Initial HEP; frequency for icing ensuring barrier btw ice pack and skin    Person(s) Educated  Patient    Methods  Explanation;Demonstration;Verbal cues;Handout    Comprehension  Verbalized understanding;Returned demonstration;Verbal cues required;Need further instruction       PT Short Term Goals - 08/06/19 1145      PT SHORT TERM GOAL #1   Title  Independent with initial HEP    Status  On-going    Target Date  08/18/19      PT SHORT TERM GOAL #2   Title  Patient will ambulate with crutches or LRAD with normal step through pattern and increased weight shift to R    Status  On-going    Target Date  08/18/19         PT Long Term Goals - 08/06/19 1145      PT LONG TERM GOAL #1   Title  Independent with ongoing/advanced HEP    Status  On-going    Target Date  09/29/19      PT LONG TERM GOAL #2   Title  R knee AROM >/= 3-120 dg to promote normal gait and stair mechanics    Status  On-going    Target Date  09/29/19      PT LONG TERM GOAL #3   Title  R LE strength grossly >/= 4+/5 with no quad laq on SLR for improved stability    Status  On-going    Target Date  09/29/19      PT LONG TERM GOAL #4   Title  Patient will ambulate without AD with normal gait pattern on all surfaces    Status  On-going    Target Date  09/29/19      PT LONG TERM GOAL #5   Title  Patient wil negotiate stairs with normal reciprocal step pattern to increase ease of access to all level of home    Status  On-going    Target Date  09/29/19            Plan - 08/06/19 1145    Clinical Impression Statement  Jesus Nunez reporting ortho PA started him on antibiotics for his knee and Lasix for the edema - states feels like he is starting to notice a little improvement although R LE remains very red and swollen with pain continuing to increase as the day progresses and sleep limited to ~2 hrs at a time due to pain. Reviewed gait training foe step through gait pattern with crutches with good return demonstration immediately following training but repeated cueing necessary as patient getting up to leave clinic. Initiated training in HEP for R knee ROM and LE strengthening with patient reporting R knee felt better after "getting it moving". Treatment concluded with vasopnuematic compression to promote decreased pain and edema with patient noting benefit from this.    Comorbidities  OA, DM, h/o lumbar compression fracture, R shoulder fracture s/p pinning, h/o seizures    Rehab Potential  Good    PT Frequency  3x / week   3x/wk x 3-4 weeks, tapering to 2x/wk for duration of POC   PT Duration  8 weeks   6-8 wks   PT  Treatment/Interventions  ADLs/Self Care Home Management;Cryotherapy;Electrical Stimulation;Gait training;Stair training;Functional mobility training;Therapeutic activities;Therapeutic exercise;Balance training;Neuromuscular re-education;Patient/family education;Manual techniques;Scar mobilization;Passive range of motion;Dry needling;Taping;Vasopneumatic Device;Joint Manipulations    PT Next Visit Plan  review of proper gait pattern with crutches as indicated; review of initila HEP; progresion of R knee ROM & LE strengthening; modalities PRN for pain & edema    PT Home Exercise Plan  08/06/19 - quad sets, SLR, AAROM heels slides (supine & seated), SAQ, knee extension prop + quad sets       Patient will benefit from skilled therapeutic intervention in order to improve the following deficits and impairments:  Abnormal gait, Decreased activity tolerance, Decreased balance, Decreased endurance, Decreased knowledge of precautions, Decreased knowledge of use of DME, Decreased mobility, Decreased range of motion, Decreased safety awareness, Decreased scar mobility, Decreased strength, Difficulty walking, Increased edema, Impaired perceived functional ability, Impaired flexibility, Improper body mechanics, Postural dysfunction, Pain  Visit Diagnosis: Acute pain of right knee  Stiffness of right knee, not elsewhere classified  Localized edema  Muscle  weakness (generalized)  Other abnormalities of gait and mobility  Difficulty in walking, not elsewhere classified     Problem List Patient Active Problem List   Diagnosis Date Noted  . S/P right UKR 07/29/2019  . Encounter for health maintenance examination with abnormal findings 07/09/2018  . Benign prostatic hyperplasia with urinary frequency 07/09/2018  . Controlled type 2 diabetes mellitus without complication, without long-term current use of insulin (HCC) 07/09/2018    Marry Guan, PT, MPT 08/06/2019, 12:36 PM  [facility] [address]  [address] [place], [place], [address] Phone: [phone]   Fax:  [phone]  Name: Jesus Nunez MRN: [id number] Date of Birth: 1956-08-04

## 2019-08-07 ENCOUNTER — Other Ambulatory Visit (HOSPITAL_BASED_OUTPATIENT_CLINIC_OR_DEPARTMENT_OTHER): Payer: Self-pay | Admitting: Orthopedic Surgery

## 2019-08-07 ENCOUNTER — Ambulatory Visit (HOSPITAL_BASED_OUTPATIENT_CLINIC_OR_DEPARTMENT_OTHER)
Admission: RE | Admit: 2019-08-07 | Discharge: 2019-08-07 | Disposition: A | Discharge status: Home / Self Care | Payer: 59 | Source: Ambulatory Visit | Attending: Orthopedic Surgery | Admitting: Orthopedic Surgery

## 2019-08-07 DIAGNOSIS — M7989 Other specified soft tissue disorders: Secondary | ICD-10-CM | POA: Insufficient documentation

## 2019-08-10 ENCOUNTER — Other Ambulatory Visit: Payer: Self-pay

## 2019-08-10 ENCOUNTER — Encounter: Payer: Self-pay | Admitting: Physical Therapy

## 2019-08-10 ENCOUNTER — Ambulatory Visit: Payer: 59 | Admitting: Physical Therapy

## 2019-08-10 DIAGNOSIS — M25661 Stiffness of right knee, not elsewhere classified: Secondary | ICD-10-CM

## 2019-08-10 DIAGNOSIS — R600 Localized edema: Secondary | ICD-10-CM

## 2019-08-10 DIAGNOSIS — R2689 Other abnormalities of gait and mobility: Secondary | ICD-10-CM

## 2019-08-10 DIAGNOSIS — M25561 Pain in right knee: Secondary | ICD-10-CM

## 2019-08-10 DIAGNOSIS — M6281 Muscle weakness (generalized): Secondary | ICD-10-CM

## 2019-08-10 DIAGNOSIS — R262 Difficulty in walking, not elsewhere classified: Secondary | ICD-10-CM

## 2019-08-10 NOTE — Therapy (Signed)
[facility] [place] [address]  [address] [place], [place], [address] Phone: [phone]   Fax:  [phone]  Physical Therapy Treatment  Patient Details  Name: Jesus Nunez MRN: [id number] Date of Birth: 03/19/1956 Referring Provider (PT): Lanney GinsMatthew Babish, PA-C [name](Matthew Olin, MD)   Encounter Date: 08/10/2019  PT End of Session - 08/10/19 0843    Visit Number  3    Number of Visits  20    Date for PT Re-Evaluation  09/29/19    Authorization Type  [employer] - VL: 60    Authorization - Number of Visits  60    PT Start Time  0843    PT Stop Time  0938    PT Time Calculation (min)  55 min    Activity Tolerance  Patient tolerated treatment well    Behavior During Therapy  [facility] for tasks assessed/performed       Past Medical History:  Diagnosis Date  . Arthritis    OA  . Diabetes mellitus without complication (HCC)    TYPE 2  . Lumbar stress fracture AGE 71   NO SX DONE, HEALED  . 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 He currently does not take naything for it. He declines meds and is tired.     Past Surgical History:  Procedure Laterality Date  . FRACTURE SURGERY  1978   RIGHT SHOULDER PINNING  . HERNIA REPAIR     UMBILICAL 2017, GROIN FEB 1975  . PARTIAL KNEE ARTHROPLASTY Right 07/29/2019   Procedure: UNICOMPARTMENTAL KNEE Medially;  Surgeon: Durene Romanslin, Matthew, MD;  Location: [facility];  Service: Orthopedics;  Laterality: Right;  90 mins  . RIGHT SHOULDER PINS REMOVED      There were no vitals filed for this visit.  Subjective Assessment - 08/10/19 0845    Subjective  Pt reporting his R leg was doing much better yesterday - able to walk around w/o crutches but notes it started swelling by the end of the day. Reports limited compliance with HEP but states he tries to keep moving and "stretch" his knee.    Pertinent History  07/29/19 - R unicompartmental knee replacement    Patient Stated Goals  "to get back to normal"     Currently in Pain?  Yes    Pain Score  3     Pain Location  Knee    Pain Orientation  Right    Pain Type  Acute pain;Surgical pain    Pain Frequency  Intermittent         [facility] PT Assessment - 08/10/19 0843      Assessment   Next MD Visit  08/25/19      AROM   Right Knee Extension  3    Right Knee Flexion  106                   [name] Treatment/Exercise - 08/10/19 0843      Ambulation/Gait   Ambulation/Gait Assistance  5: Supervision    Ambulation/Gait Assistance Details  Provided instruction in proper gait pattern with single axillary crutch to minimize antalgic gait.    Ambulation Distance (Feet)  150 Feet    Assistive device  L Axillary Crutch    Gait Pattern  Step-through pattern    Ambulation Surface  Level;Indoor      Exercises   Exercises  Knee/Hip      Knee/Hip Exercises: Aerobic   Nustep  L4 x 6 min (UE/LE)      Knee/Hip Exercises: Standing   Heel Raises  Both;20 reps;3 seconds    Heel Raises Limitations  UE support on counter    Hip Flexion  Right;Left;10 reps;Knee straight;Stengthening    Hip Flexion Limitations  red TB; UE support on back of chair    Hip ADduction  Right;Left;10 reps;Strengthening    Hip ADduction Limitations  red TB; UE support on back of chair    Hip Abduction  Right;Left;10 reps;Knee straight;Stengthening    Abduction Limitations  red TB; UE support on back of chair    Hip Extension  Right;Left;10 reps;Knee straight;Stengthening    Extension Limitations  red TB; UE support on back of chair    Functional Squat  10 reps;3 seconds    Functional Squat Limitations  counter squat      Knee/Hip Exercises: Seated   Heel Slides  Right;10 reps;AAROM    Heel Slides Limitations  overpressure from L foot    Other Seated Knee/Hip Exercises  R Fitter leg press (1 black/1 blue) x 10    Hamstring Curl  Right;10 reps;Strengthening    Hamstring Limitations  red TB      Knee/Hip Exercises: Supine   Quad Sets  Right;10  reps;AROM;Strengthening   5" hold   Heel Slides  Right;10 reps;AAROM    Heel Slides Limitations  strap assist with towel under heel    Straight Leg Raises  Right;10 reps;AROM;Strengthening    Straight Leg Raises Limitations  cues for quad set prior to initiation of lift - quad lag much improved      Modalities   Modalities  Vasopneumatic      Vasopneumatic   Number Minutes Vasopneumatic   10 minutes    Vasopnuematic Location   Knee   R - LE elevated on wedge   Vasopneumatic Pressure  Medium    Vasopneumatic Temperature   34 dg               PT Short Term Goals - 08/06/19 1145      PT SHORT TERM GOAL #1   Title  Independent with initial HEP    Status  On-going    Target Date  08/18/19      PT SHORT TERM GOAL #2   Title  Patient will ambulate with crutches or LRAD with normal step through pattern and increased weight shift to R    Status  On-going    Target Date  08/18/19        PT Long Term Goals - 08/06/19 1145      PT LONG TERM GOAL #1   Title  Independent with ongoing/advanced HEP    Status  On-going    Target Date  09/29/19      PT LONG TERM GOAL #2   Title  R knee AROM >/= 3-120 dg to promote normal gait and stair mechanics    Status  On-going    Target Date  09/29/19      PT LONG TERM GOAL #3   Title  R LE strength grossly >/= 4+/5 with no quad laq on SLR for improved stability    Status  On-going    Target Date  09/29/19      PT LONG TERM GOAL #4   Title  Patient will ambulate without AD with normal gait pattern on all surfaces    Status  On-going    Target Date  09/29/19      PT LONG  TERM GOAL #5   Title  Patient wil negotiate stairs with normal reciprocal step pattern to increase ease of access to all level of home    Status  On-going    Target Date  09/29/19            Plan - 08/10/19 0849    Clinical Impression Statement  Jesus Nunez reporting knee pain and swelling much better than last week allowing him to intermittently walk around  w/o crutches. Encouraged patient to wean to single crutch until able to walk w/o favoring R leg and provided gait training instructions to promote proper gait pattern with single axillary crutch. Introduced some standing exercises with good tolerance and reviewed initial HEP to ensure good understanding as patient reporting limited compliance this far at home. Treatment concluded with vasopnuematic compression to minimize post-exercise pain and edema.    Comorbidities  OA, DM, h/o lumbar compression fracture, R shoulder fracture s/p pinning, h/o seizures    Rehab Potential  Good    PT Frequency  3x / week   3x/wk x 3-4 weeks, tapering to 2x/wk for duration of POC   PT Duration  8 weeks   6-8 wks   PT Treatment/Interventions  ADLs/Self Care Home Management;Cryotherapy;Electrical Stimulation;Gait training;Stair training;Functional mobility training;Therapeutic activities;Therapeutic exercise;Balance training;Neuromuscular re-education;Patient/family education;Manual techniques;Scar mobilization;Passive range of motion;Dry needling;Taping;Vasopneumatic Device;Joint Manipulations    PT Next Visit Plan  review of proper gait pattern with 1-2 crutches as indicated; review of initial HEP; progresion of R knee ROM & LE strengthening; modalities PRN for pain & edema    PT Home Exercise Plan  08/06/19 - quad sets, SLR, AAROM heels slides (supine & seated), SAQ, knee extension prop + quad sets       Patient will benefit from skilled therapeutic intervention in order to improve the following deficits and impairments:  Abnormal gait, Decreased activity tolerance, Decreased balance, Decreased endurance, Decreased knowledge of precautions, Decreased knowledge of use of DME, Decreased mobility, Decreased range of motion, Decreased safety awareness, Decreased scar mobility, Decreased strength, Difficulty walking, Increased edema, Impaired perceived functional ability, Impaired flexibility, Improper body mechanics,  Postural dysfunction, Pain  Visit Diagnosis: Acute pain of right knee  Stiffness of right knee, not elsewhere classified  Localized edema  Muscle weakness (generalized)  Other abnormalities of gait and mobility  Difficulty in walking, not elsewhere classified     Problem List Patient Active Problem List   Diagnosis Date Noted  . S/P right UKR 07/29/2019  . Encounter for health maintenance examination with abnormal findings 07/09/2018  . Benign prostatic hyperplasia with urinary frequency 07/09/2018  . Controlled type 2 diabetes mellitus without complication, without long-term current use of insulin ([place]) 07/09/2018    Percival Spanish, PT, MPT 08/10/2019, 12:05 PM  [facility] [address]  [place], [place], [address] Phone: [phone]   Fax:  [phone]  Name: Jesus Nunez MRN: [id number] Date of Birth: 1955-11-22

## 2019-08-12 ENCOUNTER — Ambulatory Visit: Payer: 59 | Attending: Family Medicine

## 2019-08-12 ENCOUNTER — Other Ambulatory Visit: Payer: Self-pay

## 2019-08-12 DIAGNOSIS — M25561 Pain in right knee: Secondary | ICD-10-CM | POA: Insufficient documentation

## 2019-08-12 DIAGNOSIS — R2689 Other abnormalities of gait and mobility: Secondary | ICD-10-CM | POA: Diagnosis present

## 2019-08-12 DIAGNOSIS — R600 Localized edema: Secondary | ICD-10-CM | POA: Diagnosis present

## 2019-08-12 DIAGNOSIS — M6281 Muscle weakness (generalized): Secondary | ICD-10-CM | POA: Insufficient documentation

## 2019-08-12 DIAGNOSIS — R262 Difficulty in walking, not elsewhere classified: Secondary | ICD-10-CM | POA: Insufficient documentation

## 2019-08-12 DIAGNOSIS — M25661 Stiffness of right knee, not elsewhere classified: Secondary | ICD-10-CM | POA: Diagnosis present

## 2019-08-12 NOTE — Therapy (Signed)
[facility] [address]  [address] [place], [place], [address] Phone: [phone]   Fax:  [phone]  Physical Therapy Treatment  Patient Details  Name: Jesus Nunez MRN: [id number] Date of Birth: [birth year] Referring Provider (PT): Lanney Gins, PA-C [name], MD)   Encounter Date: 08/12/2019  PT End of Session - 08/12/19 0902    Visit Number  4    Number of Visits  20    Date for PT Re-Evaluation  09/29/19    Authorization Type  [employer] - VL: 60    Authorization - Number of Visits  60    PT Start Time  0846    PT Stop Time  0940    PT Time Calculation (min)  54 min    Activity Tolerance  Patient tolerated treatment well    Behavior During Therapy  [facility] for tasks assessed/performed       Past Medical History:  Diagnosis Date  . Arthritis    OA  . Diabetes mellitus without complication (HCC)    TYPE 2  . Lumbar stress fracture AGE 21   NO SX DONE, HEALED  . 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 He currently does not take naything for it. He declines meds and is tired.     Past Surgical History:  Procedure Laterality Date  . FRACTURE SURGERY  1978   RIGHT SHOULDER PINNING  . HERNIA REPAIR     UMBILICAL 2017, GROIN FEB 1975  . PARTIAL KNEE ARTHROPLASTY Right 07/29/2019   Procedure: UNICOMPARTMENTAL KNEE Medially;  Surgeon: [name], MD;  Location: [facility];  Service: Orthopedics;  Laterality: Right;  90 mins  . RIGHT SHOULDER PINS REMOVED      There were no vitals filed for this visit.  Subjective Assessment - 08/12/19 0850    Subjective  Pt. reporting he "overdid it in the yard" blowing leaves for three hours working.  His knee was very sore last night and improved to average pain levels of 2/10 today.    Pertinent History  07/29/19 - R unicompartmental knee replacement    Patient Stated Goals  "to get back to normal"    Currently in Pain?  Yes    Pain Score  2    up to 7/10 last  night   Pain Location  Knee    Pain Orientation  Right    Pain Descriptors / Indicators  Tightness;Pressure    Pain Type  Acute pain;Surgical pain    Pain Onset  1 to 4 weeks ago    Pain Frequency  Intermittent    Aggravating Factors   overdoing it in the yard    Pain Relieving Factors  rest, ice    Multiple Pain Sites  No                       [name] Treatment/Exercise - 08/12/19 0001      Ambulation/Gait   Ambulation/Gait  Yes    Ambulation/Gait Assistance  5: Supervision    Ambulation/Gait Assistance Details  Required instruction in gait sequencing and L step length throughout with single axillary crutch to ensure proper mechanics; good carryover of understanding to end session     Ambulation Distance (Feet)  180 Feet    Assistive device  L Axillary Crutch    Gait Pattern  Step-through pattern;Decreased step length - left;Decreased stance time - right    Ambulation Surface  Level;Indoor      Knee/Hip Exercises: Stretches   Passive Hamstring Stretch  Right;1 rep;30 seconds    Passive Hamstring Stretch Limitations  supine with strap     Hip Flexor Stretch  Right;1 rep;60 seconds    Other Knee/Hip Stretches  Seated R knee extension heel prop stretch x 1 min    cues required to avoid R hip ER substitution      Knee/Hip Exercises: Standing   Lateral Step Up  Right;10 reps;Step Height: 6";Hand Hold: 2;1 set    Lateral Step Up Limitations  Cues for slow eccentric lowering     Forward Step Up  Right;10 reps;Step Height: 6";1 set;Hand Hold: 1    Forward Step Up Limitations  cues for eccentric step-back     Functional Squat  10 reps;3 seconds   Emphasis on keeping this to a "mini" squat    Functional Squat Limitations  counter squat      Knee/Hip Exercises: Supine   Straight Leg Raises  Right;15 reps;Strengthening    Straight Leg Raises Limitations  cues for quad set periodically required     Knee Flexion  Right;AAROM;10 reps    Knee Flexion Limitations   peanut p-ball and opp LE assistance       Vasopneumatic   Number Minutes Vasopneumatic   10 minutes    Vasopnuematic Location   Knee    Vasopneumatic Pressure  Medium    Vasopneumatic Temperature   34 dg             PT Education - 08/12/19 1324    Education Details  HEP update; counter squat    Person(s) Educated  Patient    Methods  Explanation;Demonstration;Verbal cues;Handout    Comprehension  Verbalized understanding;Returned demonstration;Verbal cues required       PT Short Term Goals - 08/06/19 1145      PT SHORT TERM GOAL #1   Title  Independent with initial HEP    Status  On-going    Target Date  08/18/19      PT SHORT TERM GOAL #2   Title  Patient will ambulate with crutches or LRAD with normal step through pattern and increased weight shift to R    Status  On-going    Target Date  08/18/19        PT Long Term Goals - 08/06/19 1145      PT LONG TERM GOAL #1   Title  Independent with ongoing/advanced HEP    Status  On-going    Target Date  09/29/19      PT LONG TERM GOAL #2   Title  R knee AROM >/= 3-120 dg to promote normal gait and stair mechanics    Status  On-going    Target Date  09/29/19      PT LONG TERM GOAL #3   Title  R LE strength grossly >/= 4+/5 with no quad laq on SLR for improved stability    Status  On-going    Target Date  09/29/19      PT LONG TERM GOAL #4   Title  Patient will ambulate without AD with normal gait pattern on all surfaces    Status  On-going    Target Date  09/29/19      PT LONG TERM GOAL #5   Title  Patient wil negotiate stairs with normal reciprocal step pattern to increase ease of access to all level of home    Status  On-going    Target  Date  09/29/19            Plan - 08/12/19 0902    Clinical Impression Statement  Jesus Nunez reporting he is performing HEP and denied need to review.  Did require cueing to prevent R hip ER during seated heel prop extension stretch today in session.  Tolerated all LE  strength and ROM activities today well without issue.  Did require cueing with SLR for quad set however good carryover following.  HEP updated with counter squat with special instruction to maintain performing "mini" squat as pt. demonstrating tendency to increased depth of squat as set progressed.  Pt. verbalized understanding of this.  Ended visit with ice/compression to knee to reduce post-exercise swelling and pain.    Personal Factors and Comorbidities  Behavior Pattern;Comorbidity 3+;Past/Current Experience    Comorbidities  OA, DM, h/o lumbar compression fracture, R shoulder fracture s/p pinning, h/o seizures    Rehab Potential  Good    PT Treatment/Interventions  ADLs/Self Care Home Management;Cryotherapy;Electrical Stimulation;Gait training;Stair training;Functional mobility training;Therapeutic activities;Therapeutic exercise;Balance training;Neuromuscular re-education;Patient/family education;Manual techniques;Scar mobilization;Passive range of motion;Dry needling;Taping;Vasopneumatic Device;Joint Manipulations    PT Next Visit Plan  review of proper gait pattern with 1-2 crutches as indicated; progresion of R knee ROM & LE strengthening; modalities PRN for pain & edema    PT Home Exercise Plan  08/06/19 - quad sets, SLR, AAROM heels slides (supine & seated), SAQ, knee extension prop + quad sets; 08/12/19: counter squat    Consulted and Agree with Plan of Care  Patient       Patient will benefit from skilled therapeutic intervention in order to improve the following deficits and impairments:  Abnormal gait, Decreased activity tolerance, Decreased balance, Decreased endurance, Decreased knowledge of precautions, Decreased knowledge of use of DME, Decreased mobility, Decreased range of motion, Decreased safety awareness, Decreased scar mobility, Decreased strength, Difficulty walking, Increased edema, Impaired perceived functional ability, Impaired flexibility, Improper body mechanics, Postural  dysfunction, Pain  Visit Diagnosis: Acute pain of right knee  Stiffness of right knee, not elsewhere classified  Localized edema  Muscle weakness (generalized)  Other abnormalities of gait and mobility  Difficulty in walking, not elsewhere classified     Problem List Patient Active Problem List   Diagnosis Date Noted  . S/P right UKR 07/29/2019  . Encounter for health maintenance examination with abnormal findings 07/09/2018  . Benign prostatic hyperplasia with urinary frequency 07/09/2018  . Controlled type 2 diabetes mellitus without complication, without long-term current use of insulin (HCC) 07/09/2018    Kermit BaloMicah Ralonda Tartt, PTA 08/12/19 1:35 PM    [facility] [place] [address]  [address] [place], [place], [address] Phone: [phone]   Fax:  [phone]  Name: Jesus Nunez MRN: [id number] Date of Birth: 09/03/1956

## 2019-08-13 ENCOUNTER — Other Ambulatory Visit: Payer: Self-pay

## 2019-08-13 ENCOUNTER — Ambulatory Visit: Payer: 59

## 2019-08-13 DIAGNOSIS — M25561 Pain in right knee: Secondary | ICD-10-CM

## 2019-08-13 DIAGNOSIS — R2689 Other abnormalities of gait and mobility: Secondary | ICD-10-CM

## 2019-08-13 DIAGNOSIS — M6281 Muscle weakness (generalized): Secondary | ICD-10-CM

## 2019-08-13 DIAGNOSIS — R600 Localized edema: Secondary | ICD-10-CM

## 2019-08-13 DIAGNOSIS — M25661 Stiffness of right knee, not elsewhere classified: Secondary | ICD-10-CM

## 2019-08-13 DIAGNOSIS — R262 Difficulty in walking, not elsewhere classified: Secondary | ICD-10-CM

## 2019-08-13 NOTE — Therapy (Signed)
[facility] [address]  [address] [place], [place], [address] Phone: [phone]   Fax:  [phone]  Physical Therapy Treatment  Patient Details  Name: Jesus Nunez MRN: [id number] Date of Birth: 01-30-1956 Referring Provider (PT): Lanney Gins, PA-C [name], MD)   Encounter Date: 08/13/2019  PT End of Session - 08/13/19 0806    Visit Number  5    Number of Visits  20    Date for PT Re-Evaluation  09/29/19    Authorization Type  [employer] - VL: 60    Authorization - Number of Visits  60    PT Start Time  0800    PT Stop Time  0855    PT Time Calculation (min)  55 min    Activity Tolerance  Patient tolerated treatment well    Behavior During Therapy  [facility] for tasks assessed/performed       Past Medical History:  Diagnosis Date  . Arthritis    OA  . Diabetes mellitus without complication (HCC)    TYPE 2  . Lumbar stress fracture AGE 24   NO SX DONE, HEALED  . 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 He currently does not take naything for it. He declines meds and is tired.     Past Surgical History:  Procedure Laterality Date  . FRACTURE SURGERY  1978   RIGHT SHOULDER PINNING  . HERNIA REPAIR     UMBILICAL 2017, GROIN FEB 1975  . PARTIAL KNEE ARTHROPLASTY Right 07/29/2019   Procedure: UNICOMPARTMENTAL KNEE Medially;  Surgeon: [name], MD;  Location: [facility];  Service: Orthopedics;  Laterality: Right;  90 mins  . RIGHT SHOULDER PINS REMOVED      There were no vitals filed for this visit.  Subjective Assessment - 08/13/19 0804    Subjective  Pt. reporting soreness at R knee after last session.  Didn't get much sleep last night as he was unable to get comfortable in bed.    Pertinent History  07/29/19 - R unicompartmental knee replacement    Patient Stated Goals  "to get back to normal"    Currently in Pain?  Yes    Pain Score  2     Pain Location  Knee    Pain Orientation  Right    Pain  Descriptors / Indicators  Tightness    Pain Type  Acute pain;Surgical pain    Pain Onset  1 to 4 weeks ago    Pain Frequency  Intermittent    Multiple Pain Sites  No                       [name] Treatment/Exercise - 08/13/19 0001      Knee/Hip Exercises: Stretches   Hip Flexor Stretch  Right;1 rep;60 seconds    Hip Flexor Stretch Limitations  mod thomas position with strap       Knee/Hip Exercises: Aerobic   Recumbent Bike  Lvl 1, 6 min - partial and full revolutions for ROM       Knee/Hip Exercises: Machines for Strengthening   Cybex Leg Press  B LE's 25# x 15 reps       Knee/Hip Exercises: Standing   Terminal Knee Extension  Right;15 reps;Theraband    Theraband Level (Terminal Knee Extension)  Level 4 (Blue)    Terminal Knee Extension Limitations  TKE with band in door  Knee/Hip Exercises: Supine   Bridges  Both;10 reps    Knee Flexion  Right;AAROM;15 reps    Knee Flexion Limitations  peanut p-ball and opp LE assistance       Knee/Hip Exercises: Sidelying   Hip ABduction  Right;10 reps    Hip ADduction  Right;10 reps      Vasopneumatic   Number Minutes Vasopneumatic   10 minutes    Vasopnuematic Location   Knee    Vasopneumatic Pressure  Medium    Vasopneumatic Temperature   34 dg      Manual Therapy   Manual Therapy  Joint mobilization    Manual therapy comments  seated     Joint Mobilization  R patellar mobs inferior/superior, lateral/medial grade III - for improved ROM    pt. instructed in this - good understanding and techinqeu            PT Education - 08/13/19 0902    Education Details  HEP update; patellar mobs, hip flexor/quad stretch    Person(s) Educated  Patient    Methods  Explanation;Demonstration;Verbal cues;Handout    Comprehension  Verbalized understanding;Returned demonstration;Verbal cues required       PT Short Term Goals - 08/06/19 1145      PT SHORT TERM GOAL #1   Title  Independent with initial HEP     Status  On-going    Target Date  08/18/19      PT SHORT TERM GOAL #2   Title  Patient will ambulate with crutches or LRAD with normal step through pattern and increased weight shift to R    Status  On-going    Target Date  08/18/19        PT Long Term Goals - 08/06/19 1145      PT LONG TERM GOAL #1   Title  Independent with ongoing/advanced HEP    Status  On-going    Target Date  09/29/19      PT LONG TERM GOAL #2   Title  R knee AROM >/= 3-120 dg to promote normal gait and stair mechanics    Status  On-going    Target Date  09/29/19      PT LONG TERM GOAL #3   Title  R LE strength grossly >/= 4+/5 with no quad laq on SLR for improved stability    Status  On-going    Target Date  09/29/19      PT LONG TERM GOAL #4   Title  Patient will ambulate without AD with normal gait pattern on all surfaces    Status  On-going    Target Date  09/29/19      PT LONG TERM GOAL #5   Title  Patient wil negotiate stairs with normal reciprocal step pattern to increase ease of access to all level of home    Status  On-going    Target Date  09/29/19            Plan - 08/13/19 0806    Clinical Impression Statement  Jesus Nunez reporting increased R knee pain levels yesterday however R knee has "loosened up" as the morning has progressed today.  Progressed LE/quad flexibility work and added supine bridge, sidelying hip abduction, adduction, and standing blue TB TKE without increased R knee pain.  Ended visit with HEP updated (see pt. instruction) and ice/compression to R knee to reduce post-exercise swelling and pain.    Personal Factors and Comorbidities  Behavior Pattern;Comorbidity 3+;Past/Current Experience  Comorbidities  OA, DM, h/o lumbar compression fracture, R shoulder fracture s/p pinning, h/o seizures    Rehab Potential  Good    PT Treatment/Interventions  ADLs/Self Care Home Management;Cryotherapy;Electrical Stimulation;Gait training;Stair training;Functional mobility  training;Therapeutic activities;Therapeutic exercise;Balance training;Neuromuscular re-education;Patient/family education;Manual techniques;Scar mobilization;Passive range of motion;Dry needling;Taping;Vasopneumatic Device;Joint Manipulations    PT Next Visit Plan  review of proper gait pattern with 1-2 crutches as indicated; progresion of R knee ROM & LE strengthening; modalities PRN for pain & edema    PT Home Exercise Plan  08/06/19 - quad sets, SLR, AAROM heels slides (supine & seated), SAQ, knee extension prop + quad sets; 08/12/19: counter squat; 08/13/19: patellar mobs, mod thomas hip flexor quad stretch    Consulted and Agree with Plan of Care  Patient       Patient will benefit from skilled therapeutic intervention in order to improve the following deficits and impairments:  Abnormal gait, Decreased activity tolerance, Decreased balance, Decreased endurance, Decreased knowledge of precautions, Decreased knowledge of use of DME, Decreased mobility, Decreased range of motion, Decreased safety awareness, Decreased scar mobility, Decreased strength, Difficulty walking, Increased edema, Impaired perceived functional ability, Impaired flexibility, Improper body mechanics, Postural dysfunction, Pain  Visit Diagnosis: Acute pain of right knee  Stiffness of right knee, not elsewhere classified  Localized edema  Muscle weakness (generalized)  Other abnormalities of gait and mobility  Difficulty in walking, not elsewhere classified     Problem List Patient Active Problem List   Diagnosis Date Noted  . S/P right UKR 07/29/2019  . Encounter for health maintenance examination with abnormal findings 07/09/2018  . Benign prostatic hyperplasia with urinary frequency 07/09/2018  . Controlled type 2 diabetes mellitus without complication, without long-term current use of insulin ([place]) 07/09/2018    Bess Harvest, PTA 08/13/19 12:50 PM   [facility] [address]  [place], [place], [address] Phone: [phone]   Fax:  [phone]  Name: Jesus Nunez MRN: [id number] Date of Birth: [birth year]

## 2019-08-16 ENCOUNTER — Other Ambulatory Visit: Payer: Self-pay

## 2019-08-16 ENCOUNTER — Ambulatory Visit: Payer: 59

## 2019-08-16 DIAGNOSIS — M25561 Pain in right knee: Secondary | ICD-10-CM

## 2019-08-16 DIAGNOSIS — M6281 Muscle weakness (generalized): Secondary | ICD-10-CM

## 2019-08-16 DIAGNOSIS — R600 Localized edema: Secondary | ICD-10-CM

## 2019-08-16 DIAGNOSIS — M25661 Stiffness of right knee, not elsewhere classified: Secondary | ICD-10-CM

## 2019-08-16 DIAGNOSIS — R2689 Other abnormalities of gait and mobility: Secondary | ICD-10-CM

## 2019-08-16 DIAGNOSIS — R262 Difficulty in walking, not elsewhere classified: Secondary | ICD-10-CM

## 2019-08-16 NOTE — Therapy (Signed)
[facility] [address]  [place], [place], [address] Phone: [phone]   Fax:  [phone]  Physical Therapy Treatment  Patient Details  Name: Jesus Nunez MRN: [id number] Date of Birth: [birth year] Referring Provider (PT): Danae Orleans, PA-C Paralee Cancel, MD)   Encounter Date: 08/16/2019  PT End of Session - 08/16/19 1110    Visit Number  6    Number of Visits  20    Date for PT Re-Evaluation  09/29/19    Authorization Type  [employer] - VL: 45    Authorization - Number of Visits  23    PT Start Time  1103    PT Stop Time  1200    PT Time Calculation (min)  57 min    Activity Tolerance  Patient tolerated treatment well    Behavior During Therapy  [facility] for tasks assessed/performed       Past Medical History:  Diagnosis Date  . Arthritis    OA  . Diabetes mellitus without complication ([place])    TYPE 2  . Lumbar stress fracture AGE 89   NO SX DONE, HEALED  . Seizures ([place])    2012, possibly related to drug use. He currently does not take naything for it. He declines meds and is tired.     Past Surgical History:  Procedure Laterality Date  . FRACTURE SURGERY  1978   RIGHT SHOULDER PINNING  . HERNIA REPAIR     UMBILICAL 4650, GROIN FEB 1975  . PARTIAL KNEE ARTHROPLASTY Right 07/29/2019   Procedure: UNICOMPARTMENTAL KNEE Medially;  Surgeon: Paralee Cancel, MD;  Location: [facility];  Service: Orthopedics;  Laterality: Right;  90 mins  . RIGHT SHOULDER PINS REMOVED      There were no vitals filed for this visit.  Subjective Assessment - 08/16/19 1108    Subjective  Pt. reporting he was very active on Saturday and has had difficulty sleeping due to R knee pain.    Pertinent History  07/29/19 - R unicompartmental knee replacement    Patient Stated Goals  "to get back to normal"    Currently in Pain?  No/denies    Pain Score  0-No pain    Multiple Pain Sites  No         [facility] PT Assessment - 08/16/19 0001       Assessment   Medical Diagnosis  R unicompartmental knee replacement    Referring Provider (PT)  Danae Orleans, PA-C Paralee Cancel, MD)    Onset Date/Surgical Date  07/29/19    Next MD Visit  08/25/19      AROM   AROM Assessment Site  Knee    Right/Left Knee  Right    Right Knee Extension  1    Right Knee Flexion  116                   [name] Treatment/Exercise - 08/16/19 0001      Ambulation/Gait   Ambulation/Gait  Yes    Ambulation/Gait Assistance  5: Supervision    Ambulation/Gait Assistance Details  Cues for B heel strike and even stance time; pt. with good carryover and minimal gait deviation after cueing ambulating without AD    Ambulation Distance (Feet)  90 Feet    Gait Pattern  Step-through pattern;Decreased step length - left;Decreased stance time - right    Ambulation Surface  Level;Indoor      Self-Care   Self-Care  Scar Mobilizations  Scar Mobilizations  Pt. eductated on R knee scar massage with instruction to avoid lower half of incision which is not yet healed      Knee/Hip Exercises: Stretches   Passive Hamstring Stretch  Right;1 rep;30 seconds    Passive Hamstring Stretch Limitations  supine with strap     Hip Flexor Stretch  Right;1 rep;60 seconds    Hip Flexor Stretch Limitations  mod thomas position with strap     Knee: Self-Stretch to increase Flexion  Right    Knee: Self-Stretch Limitations  R knee flexion lunge stretch with B UE support at TM 5" x 10 reps     Gastroc Stretch  Right;1 rep;30 seconds    Gastroc Stretch Limitations  supine with strap and heel propped on bolster       Knee/Hip Exercises: Aerobic   Recumbent Bike  Lvl 1, 6 min - full revolutions       Knee/Hip Exercises: Machines for Strengthening   Cybex Knee Extension  B LEs: 10# x 15 reps    Cybex Knee Flexion  B LEs: 25# x 10 reps       Knee/Hip Exercises: Standing   Terminal Knee Extension  Right;15 reps;Theraband    Theraband Level (Terminal Knee  Extension)  Level 4 (Blue)    Terminal Knee Extension Limitations  TKE with band in door       Knee/Hip Exercises: Supine   Quad Sets  Right;10 reps;AROM;Strengthening    Quad Sets Limitations  heel       Vasopneumatic   Number Minutes Vasopneumatic   10 minutes    Vasopnuematic Location   Knee    Vasopneumatic Pressure  Medium    Vasopneumatic Temperature   34 dg      Manual Therapy   Manual Therapy  Joint mobilization;Soft tissue mobilization    Manual therapy comments  seated and supine     Joint Mobilization  R knee A/P mobs for improved ROM; R patellar mobs inferior/superior, lateral/medial grade III - for improved ROM     Soft tissue mobilization  R knee scar massage              PT Education - 08/16/19 1225    Education Details  HEP update; TKE with blue band in door, lunge knee flexion stretch with UE support, forward step-up    Person(s) Educated  Patient    Methods  Explanation;Demonstration;Verbal cues;Handout    Comprehension  Verbalized understanding;Returned demonstration;Verbal cues required       PT Short Term Goals - 08/16/19 1118      PT SHORT TERM GOAL #1   Title  Independent with initial HEP    Status  Achieved    Target Date  08/18/19      PT SHORT TERM GOAL #2   Title  Patient will ambulate with crutches or LRAD with normal step through pattern and increased weight shift to R    Status  On-going    Target Date  08/18/19        PT Long Term Goals - 08/06/19 1145      PT LONG TERM GOAL #1   Title  Independent with ongoing/advanced HEP    Status  On-going    Target Date  09/29/19      PT LONG TERM GOAL #2   Title  R knee AROM >/= 3-120 dg to promote normal gait and stair mechanics    Status  On-going    Target Date  09/29/19  PT LONG TERM GOAL #3   Title  R LE strength grossly >/= 4+/5 with no quad laq on SLR for improved stability    Status  On-going    Target Date  09/29/19      PT LONG TERM GOAL #4   Title  Patient will  ambulate without AD with normal gait pattern on all surfaces    Status  On-going    Target Date  09/29/19      PT LONG TERM GOAL #5   Title  Patient wil negotiate stairs with normal reciprocal step pattern to increase ease of access to all level of home    Status  On-going    Target Date  09/29/19            Plan - 08/16/19 1118    Clinical Impression Statement  Pt. reporting he is performing HEP regularly and denies questions regarding exercises at home thus STG #1 now met.  Pt. now able to ambulate without AD with nearly symmetrical gait mechanics still with slight decrease in R stance time. weight shift to R LE partially achieving STG #2.  Tolerated progression to machine HS curl and quad extension without increased pain today.  Verbalizes he is navigating stairs without issue.  Was able to demo R knee AROM progress to 1-116 dg today progressing toward LTG #2.  Educated pt. on scar massage to superior end of R knee incision today with instruction to avoid lower half of incision which is not yet fully healed.  Ended visit with ice/compression to R knee to reduce post-exercise soreness and swelling.    Personal Factors and Comorbidities  Behavior Pattern;Comorbidity 3+;Past/Current Experience    Comorbidities  OA, DM, h/o lumbar compression fracture, R shoulder fracture s/p pinning, h/o seizures    Rehab Potential  Good    PT Treatment/Interventions  ADLs/Self Care Home Management;Cryotherapy;Electrical Stimulation;Gait training;Stair training;Functional mobility training;Therapeutic activities;Therapeutic exercise;Balance training;Neuromuscular re-education;Patient/family education;Manual techniques;Scar mobilization;Passive range of motion;Dry needling;Taping;Vasopneumatic Device;Joint Manipulations    PT Home Exercise Plan  08/06/19 - quad sets, SLR, AAROM heels slides (supine & seated), SAQ, knee extension prop + quad sets; 08/12/19: counter squat; 08/13/19: patellar mobs, mod thomas hip  flexor quad stretch    Consulted and Agree with Plan of Care  Patient       Patient will benefit from skilled therapeutic intervention in order to improve the following deficits and impairments:  Abnormal gait, Decreased activity tolerance, Decreased balance, Decreased endurance, Decreased knowledge of precautions, Decreased knowledge of use of DME, Decreased mobility, Decreased range of motion, Decreased safety awareness, Decreased scar mobility, Decreased strength, Difficulty walking, Increased edema, Impaired perceived functional ability, Impaired flexibility, Improper body mechanics, Postural dysfunction, Pain  Visit Diagnosis: Acute pain of right knee  Stiffness of right knee, not elsewhere classified  Localized edema  Muscle weakness (generalized)  Other abnormalities of gait and mobility  Difficulty in walking, not elsewhere classified     Problem List Patient Active Problem List   Diagnosis Date Noted  . S/P right UKR 07/29/2019  . Encounter for health maintenance examination with abnormal findings 07/09/2018  . Benign prostatic hyperplasia with urinary frequency 07/09/2018  . Controlled type 2 diabetes mellitus without complication, without long-term current use of insulin ([place]) 07/09/2018    Bess Harvest, PTA 08/16/19 12:26 PM   [facility] [address]  [place], [place], [address] Phone: [phone]   Fax:  [phone]  Name: Jesus Nunez MRN: [id number]  Date of Birth: 1956/10/11

## 2019-08-18 ENCOUNTER — Encounter: Payer: Self-pay | Admitting: Physical Therapy

## 2019-08-18 ENCOUNTER — Ambulatory Visit: Payer: 59 | Admitting: Physical Therapy

## 2019-08-18 ENCOUNTER — Other Ambulatory Visit: Payer: Self-pay

## 2019-08-18 DIAGNOSIS — R262 Difficulty in walking, not elsewhere classified: Secondary | ICD-10-CM

## 2019-08-18 DIAGNOSIS — R2689 Other abnormalities of gait and mobility: Secondary | ICD-10-CM

## 2019-08-18 DIAGNOSIS — M6281 Muscle weakness (generalized): Secondary | ICD-10-CM

## 2019-08-18 DIAGNOSIS — M25561 Pain in right knee: Secondary | ICD-10-CM

## 2019-08-18 DIAGNOSIS — M25661 Stiffness of right knee, not elsewhere classified: Secondary | ICD-10-CM

## 2019-08-18 DIAGNOSIS — R600 Localized edema: Secondary | ICD-10-CM

## 2019-08-18 NOTE — Therapy (Signed)
[facility] [address]  [place], [place], [address] Phone: [phone]   Fax:  [phone]  Physical Therapy Treatment  Patient Details  Name: Jesus Nunez MRN: [id number] Date of Birth: 1955/12/14 Referring Provider (PT): Danae Orleans, PA-C Paralee Cancel, MD)   Encounter Date: 08/18/2019  PT End of Session - 08/18/19 0929    Visit Number  7    Number of Visits  20    Date for PT Re-Evaluation  09/29/19    Authorization Type  [employer] - VL: 75    Authorization - Number of Visits  63    PT Start Time  0929    PT Stop Time  1026    PT Time Calculation (min)  57 min    Activity Tolerance  Patient tolerated treatment well    Behavior During Therapy  [facility] for tasks assessed/performed       Past Medical History:  Diagnosis Date  . Arthritis    OA  . Diabetes mellitus without complication ([place])    TYPE 2  . Lumbar stress fracture AGE 33   NO SX DONE, HEALED  . Seizures ([place])    2012, possibly related to drug use. He currently does not take naything for it. He declines meds and is tired.     Past Surgical History:  Procedure Laterality Date  . FRACTURE SURGERY  1978   RIGHT SHOULDER PINNING  . HERNIA REPAIR     UMBILICAL 8832, GROIN FEB 1975  . PARTIAL KNEE ARTHROPLASTY Right 07/29/2019   Procedure: UNICOMPARTMENTAL KNEE Medially;  Surgeon: Paralee Cancel, MD;  Location: [facility];  Service: Orthopedics;  Laterality: Right;  90 mins  . RIGHT SHOULDER PINS REMOVED      There were no vitals filed for this visit.  Subjective Assessment - 08/18/19 0931    Subjective  Pt reporting pain better overall but notes increased stiffness and pain last night after busy day plus doing all of his exercises.    Pertinent History  07/29/19 - R unicompartmental knee replacement    Patient Stated Goals  "to get back to normal"    Currently in Pain?  Yes    Pain Score  2     Pain Location  Knee    Pain Orientation  Right    Pain  Descriptors / Indicators  Tightness    Pain Type  Acute pain;Surgical pain    Pain Frequency  Intermittent                       [name] Treatment/Exercise - 08/18/19 0929      Knee/Hip Exercises: Aerobic   Recumbent Bike  L1 x 6 min      Knee/Hip Exercises: Standing   Hip Flexion  Right;Left;15 reps;Stengthening;Knee straight    Hip Flexion Limitations  red TB, opp SLS on blue foam oval; UE support on back of chair    Hip ADduction  Right;Left;15 reps;Strengthening    Hip ADduction Limitations  red TB, opp SLS on blue foam oval; UE support on back of chair    Hip Abduction  Right;Left;15 reps;Stengthening;Knee straight    Abduction Limitations  red TB, opp SLS on blue foam oval; UE support on back of chair    Hip Extension  Right;Left;15 reps;Stengthening;Knee straight    Extension Limitations  red TB, opp SLS on blue foam oval; UE support on back of chair    Step Down  Right;10 reps;Step Height: 6";Hand Hold: 2    Step Down Limitations  eccentric lowering with light toe touch progressing to heel touch    Wall Squat  10 reps;3 seconds    Wall Squat Limitations  cues for even weight shift    Other Standing Knee Exercises  B side stepping & fwd/back monster walk with looped red TB at ankles 2 x 25 ft      Knee/Hip Exercises: Supine   Bridges with [employer]  Both      Modalities   Modalities  Vasopneumatic      Vasopneumatic   Number Minutes Vasopneumatic   10 minutes    Vasopnuematic Location   Knee    Vasopneumatic Pressure  High    Vasopneumatic Temperature   34 dg               PT Short Term Goals - 08/18/19 0934      PT SHORT TERM GOAL #1   Title  Independent with initial HEP    Status  Achieved   08/16/19     PT SHORT TERM GOAL #2   Title  Patient will ambulate with crutches or LRAD with normal step through pattern and increased weight shift to R    Status  Achieved   08/18/19   Target Date  --        PT Long Term Goals -  08/06/19 1145      PT LONG TERM GOAL #1   Title  Independent with ongoing/advanced HEP    Status  On-going    Target Date  09/29/19      PT LONG TERM GOAL #2   Title  R knee AROM >/= 3-120 dg to promote normal gait and stair mechanics    Status  On-going    Target Date  09/29/19      PT LONG TERM GOAL #3   Title  R LE strength grossly >/= 4+/5 with no quad laq on SLR for improved stability    Status  On-going    Target Date  09/29/19      PT LONG TERM GOAL #4   Title  Patient will ambulate without AD with normal gait pattern on all surfaces    Status  On-going    Target Date  09/29/19      PT LONG TERM GOAL #5   Title  Patient wil negotiate stairs with normal reciprocal step pattern to increase ease of access to all level of home    Status  On-going    Target Date  09/29/19            Plan - 08/18/19 0935    Clinical Impression Statement  Jesus Nunez reporting overall improvement in R knee pain but still notes some flare-ups of pain and swelling after a busy day or overdoing HEP but denies any issues with HEP. Able to ambulate with good gait pattern w/o AD but encouraged patient to use single crutch as needed for longer distances to minimize fatigue. All STGs now met. Progressed LE strengthening adding increased eccentric focus as well as proprioceptive and balance training with good tolerance. Patient progressing well with PT POC with all STGs now met.    Comorbidities  OA, DM, h/o lumbar compression fracture, R shoulder fracture s/p pinning, h/o seizures    Rehab Potential  Good    PT Frequency  3x / week   3x/wk x 3-4 weeks, tapering to 2x/wk for duration of POC   PT  Duration  8 weeks   6-8 wks   PT Treatment/Interventions  ADLs/Self Care Home Management;Cryotherapy;Electrical Stimulation;Gait training;Stair training;Functional mobility training;Therapeutic activities;Therapeutic exercise;Balance training;Neuromuscular re-education;Patient/family education;Manual techniques;Scar  mobilization;Passive range of motion;Dry needling;Taping;Vasopneumatic Device;Joint Manipulations    PT Next Visit Plan  progresion of R knee ROM & LE strengthening; modalities PRN for pain & edema    PT Home Exercise Plan  08/06/19 - quad sets, SLR, AAROM heels slides (supine & seated), SAQ, knee extension prop + quad sets; 08/12/19: counter squat; 08/13/19: patellar mobs, mod thomas hip flexor quad stretch    Consulted and Agree with Plan of Care  Patient       Patient will benefit from skilled therapeutic intervention in order to improve the following deficits and impairments:  Abnormal gait, Decreased activity tolerance, Decreased balance, Decreased endurance, Decreased knowledge of precautions, Decreased knowledge of use of DME, Decreased mobility, Decreased range of motion, Decreased safety awareness, Decreased scar mobility, Decreased strength, Difficulty walking, Increased edema, Impaired perceived functional ability, Impaired flexibility, Improper body mechanics, Postural dysfunction, Pain  Visit Diagnosis: Acute pain of right knee  Stiffness of right knee, not elsewhere classified  Localized edema  Muscle weakness (generalized)  Other abnormalities of gait and mobility  Difficulty in walking, not elsewhere classified     Problem List Patient Active Problem List   Diagnosis Date Noted  . S/P right UKR 07/29/2019  . Encounter for health maintenance examination with abnormal findings 07/09/2018  . Benign prostatic hyperplasia with urinary frequency 07/09/2018  . Controlled type 2 diabetes mellitus without complication, without long-term current use of insulin ([place]) 07/09/2018    Percival Spanish, PT, MPT 08/18/2019, 1:47 PM  [facility] [address]  [place], [place], [address] Phone: [phone]   Fax:  [phone]  Name: Jesus Nunez MRN: [id number] Date of Birth: December 24, 1955

## 2019-08-20 ENCOUNTER — Encounter: Payer: Self-pay | Admitting: Physical Therapy

## 2019-08-20 ENCOUNTER — Other Ambulatory Visit: Payer: Self-pay

## 2019-08-20 ENCOUNTER — Ambulatory Visit: Payer: 59 | Admitting: Physical Therapy

## 2019-08-20 DIAGNOSIS — R262 Difficulty in walking, not elsewhere classified: Secondary | ICD-10-CM

## 2019-08-20 DIAGNOSIS — M25561 Pain in right knee: Secondary | ICD-10-CM | POA: Diagnosis not present

## 2019-08-20 DIAGNOSIS — R600 Localized edema: Secondary | ICD-10-CM

## 2019-08-20 DIAGNOSIS — R2689 Other abnormalities of gait and mobility: Secondary | ICD-10-CM

## 2019-08-20 DIAGNOSIS — M6281 Muscle weakness (generalized): Secondary | ICD-10-CM

## 2019-08-20 DIAGNOSIS — M25661 Stiffness of right knee, not elsewhere classified: Secondary | ICD-10-CM

## 2019-08-20 NOTE — Therapy (Signed)
[facility] [address]  [employer] [place] [place], [place], [address] Phone: [phone]   Fax:  [phone]  Physical Therapy Treatment  Patient Details  Name: Jesus Nunez MRN: [id number] Date of Birth: 05-24-1956 Referring Provider (PT): Danae Orleans, PA-C Paralee Cancel, MD)   Encounter Date: 08/20/2019  PT End of Session - 08/20/19 0804    Visit Number  8    Number of Visits  20    Date for PT Re-Evaluation  09/29/19    Authorization Type  [employer] - VL: 47    Authorization - Number of Visits  60    PT Start Time  0804    PT Stop Time  0901    PT Time Calculation (min)  57 min    Activity Tolerance  Patient tolerated treatment well    Behavior During Therapy  [facility] for tasks assessed/performed       Past Medical History:  Diagnosis Date  . Arthritis    OA  . Diabetes mellitus without complication ([place])    TYPE 2  . Lumbar stress fracture AGE 21   NO SX DONE, HEALED  . Seizures ([place])    2012, possibly related to drug use. He currently does not take naything for it. He declines meds and is tired.     Past Surgical History:  Procedure Laterality Date  . FRACTURE SURGERY  1978   RIGHT SHOULDER PINNING  . HERNIA REPAIR     UMBILICAL 6803, GROIN FEB 1975  . PARTIAL KNEE ARTHROPLASTY Right 07/29/2019   Procedure: UNICOMPARTMENTAL KNEE Medially;  Surgeon: Paralee Cancel, MD;  [facility];  Service: Orthopedics;  Laterality: Right;  90 mins  . RIGHT SHOULDER PINS REMOVED      There were no vitals filed for this visit.  Subjective Assessment - 08/20/19 0806    Subjective  Pt reporting a busy day yesterday - R knee is now more stiff and sore today.    Pertinent History  07/29/19 - R unicompartmental knee replacement    Patient Stated Goals  "to get back to normal"    Currently in Pain?  Yes    Pain Score  2     Pain Location  Knee    Pain Orientation  Right    Pain Descriptors / Indicators  Tightness;Sore    Pain  Frequency  Intermittent                       [name] Treatment/Exercise - 08/20/19 0804      Ambulation/Gait   Stairs  Yes    Stairs Assistance  5: Supervision    Stair Management Technique  Alternating pattern;Forwards;No rails;One rail Right   rail on descent only   Number of Stairs  14    Height of Stairs  7    Gait Comments  Pt requiring increased effort with R leg push to lift L LE on ascent and demonstrates hip ER on R with descent as substitution due to limited eccentric control.      Knee/Hip Exercises: Aerobic   Nustep  L4 x 6 min (LE only)      Knee/Hip Exercises: Standing   Side Lunges  Right;Left;10 reps;3 seconds    Side Lunges Limitations  TRX    Forward Step Up  Right;15 reps;Step Height: 8";Hand Hold: 2    Forward Step Up Limitations  + blue TB TKE; 2 pole A    Step Down  Limitations  lateral (6") & fwd (4") eccentriclowering with heel touch    Functional Squat  15 reps;3 seconds;2 sets    Functional Squat Limitations  TRX - 2nd set with triple extension    SLS with Vectors  R SLS on blue foam oval + 3-way toe tap to cones x 10      Knee/Hip Exercises: Supine   Knee Flexion  Both;Right;AAROM;10 reps    Knee Flexion Limitations  HS curls with heels on peanut ball    Other Supine Knee/Hip Exercises  Straight leg bridge with heels on peanut ball 10 x 5"      Modalities   Modalities  Vasopneumatic      Vasopneumatic   Number Minutes Vasopneumatic   10 minutes    Vasopnuematic Location   Knee    Vasopneumatic Pressure  High    Vasopneumatic Temperature   34 dg               PT Short Term Goals - 08/18/19 0934      PT SHORT TERM GOAL #1   Title  Independent with initial HEP    Status  Achieved   08/16/19     PT SHORT TERM GOAL #2   Title  Patient will ambulate with crutches or LRAD with normal step through pattern and increased weight shift to R    Status  Achieved   08/18/19   Target Date  --        PT Long Term Goals  - 08/20/19 0845      PT LONG TERM GOAL #1   Title  Independent with ongoing/advanced HEP    Status  Partially Met      PT LONG TERM GOAL #2   Title  R knee AROM >/= 3-120 dg to promote normal gait and stair mechanics    Status  On-going      PT LONG TERM GOAL #3   Title  R LE strength grossly >/= 4+/5 with no quad laq on SLR for improved stability    Status  On-going      PT LONG TERM GOAL #4   Title  Patient will ambulate without AD with normal gait pattern on all surfaces    Status  Achieved   08/20/19     PT LONG TERM GOAL #5   Title  Patient wil negotiate stairs with normal reciprocal step pattern to increase ease of access to all level of home    Status  Partially Met            Plan - 08/20/19 0809    Clinical Impression Statement  Jesus Nunez reporting increased stiffness and soreness after another busy day yesterday but noting improvement as exercises progressed today. Able to tolerate progression of complexity of strengthening exercises as well as introduction of more proprioceptive and balance training with no increased pain but some fatigue noted. Patient able to negotiate stairs reciprocally but requiring increased effort with R leg push to lift L LE on ascent and demonstrates hip ER on R with descent as substitution due to limited eccentric control, therefore target step-up and step-down strengthening and eccentric control. Patient progressing well toward goals with gait LTG now met.    Comorbidities  OA, DM, h/o lumbar compression fracture, R shoulder fracture s/p pinning, h/o seizures    Rehab Potential  Good    PT Frequency  3x / week   3x/wk x 3-4 weeks, tapering to 2x/wk for duration of POC  PT Duration  8 weeks   6-8 wks   PT Treatment/Interventions  ADLs/Self Care Home Management;Cryotherapy;Electrical Stimulation;Gait training;Stair training;Functional mobility training;Therapeutic activities;Therapeutic exercise;Balance training;Neuromuscular  re-education;Patient/family education;Manual techniques;Scar mobilization;Passive range of motion;Dry needling;Taping;Vasopneumatic Device;Joint Manipulations    PT Next Visit Plan  progresion of R knee ROM & LE strengthening; modalities PRN for pain & edema    PT Home Exercise Plan  08/06/19 - quad sets, SLR, AAROM heels slides (supine & seated), SAQ, knee extension prop + quad sets; 08/12/19: counter squat; 08/13/19: patellar mobs, mod thomas hip flexor quad stretch    Consulted and Agree with Plan of Care  Patient       Patient will benefit from skilled therapeutic intervention in order to improve the following deficits and impairments:  Abnormal gait, Decreased activity tolerance, Decreased balance, Decreased endurance, Decreased knowledge of precautions, Decreased knowledge of use of DME, Decreased mobility, Decreased range of motion, Decreased safety awareness, Decreased scar mobility, Decreased strength, Difficulty walking, Increased edema, Impaired perceived functional ability, Impaired flexibility, Improper body mechanics, Postural dysfunction, Pain  Visit Diagnosis: Acute pain of right knee  Stiffness of right knee, not elsewhere classified  Localized edema  Muscle weakness (generalized)  Other abnormalities of gait and mobility  Difficulty in walking, not elsewhere classified     Problem List Patient Active Problem List   Diagnosis Date Noted  . S/P right UKR 07/29/2019  . Encounter for health maintenance examination with abnormal findings 07/09/2018  . Benign prostatic hyperplasia with urinary frequency 07/09/2018  . Controlled type 2 diabetes mellitus without complication, without long-term current use of insulin ([place]) 07/09/2018    Percival Spanish, PT, MPT 08/20/2019, 9:16 AM  [facility] [address]  [place], [place], [address] Phone: [phone]   Fax:  [phone]  Name: Jesus Nunez MRN:  [id number] Date of Birth: Mar 18, 1956

## 2019-08-24 ENCOUNTER — Encounter: Payer: Self-pay | Admitting: Physical Therapy

## 2019-08-24 ENCOUNTER — Other Ambulatory Visit: Payer: Self-pay

## 2019-08-24 ENCOUNTER — Ambulatory Visit: Payer: 59 | Admitting: Physical Therapy

## 2019-08-24 DIAGNOSIS — R600 Localized edema: Secondary | ICD-10-CM

## 2019-08-24 DIAGNOSIS — R2689 Other abnormalities of gait and mobility: Secondary | ICD-10-CM

## 2019-08-24 DIAGNOSIS — M6281 Muscle weakness (generalized): Secondary | ICD-10-CM

## 2019-08-24 DIAGNOSIS — M25661 Stiffness of right knee, not elsewhere classified: Secondary | ICD-10-CM

## 2019-08-24 DIAGNOSIS — M25561 Pain in right knee: Secondary | ICD-10-CM | POA: Diagnosis not present

## 2019-08-24 DIAGNOSIS — R262 Difficulty in walking, not elsewhere classified: Secondary | ICD-10-CM

## 2019-08-24 NOTE — Therapy (Signed)
[facility] [address]  [place], [place], [address] Phone: [phone]   Fax:  [phone]  Physical Therapy Treatment / Progress Note  Patient Details  Name: Jesus Nunez MRN: [id number] Date of Birth: 1956-09-29 Referring Provider (PT): Danae Orleans, PA-C Paralee Cancel, MD)   Encounter Date: 08/24/2019  PT End of Session - 08/24/19 0928    Visit Number  9    Number of Visits  20    Date for PT Re-Evaluation  09/29/19    Authorization Type  [employer] - VL: 42    Authorization - Number of Visits  31    PT Start Time  0928    PT Stop Time  1014    PT Time Calculation (min)  46 min    Activity Tolerance  Patient tolerated treatment well    Behavior During Therapy  [facility] for tasks assessed/performed       Past Medical History:  Diagnosis Date  . Arthritis    OA  . Diabetes mellitus without complication ([place])    TYPE 2  . Lumbar stress fracture AGE 74   NO SX DONE, HEALED  . Seizures ([place])    2012, possibly related to drug use. He currently does not take naything for it. He declines meds and is tired.     Past Surgical History:  Procedure Laterality Date  . FRACTURE SURGERY  1978   RIGHT SHOULDER PINNING  . HERNIA REPAIR     UMBILICAL 1245, GROIN FEB 1975  . PARTIAL KNEE ARTHROPLASTY Right 07/29/2019   Procedure: UNICOMPARTMENTAL KNEE Medially;  Surgeon: Paralee Cancel, MD;  Location: [facility];  Service: Orthopedics;  Laterality: Right;  90 mins  . RIGHT SHOULDER PINS REMOVED      There were no vitals filed for this visit.  Subjective Assessment - 08/24/19 0933    Subjective  Pt reporting increased soreness and swelling with a difficult night's sleep after last therapy session, however pain minimal today and reports he was able to sleep in bed with one of his best night's of sleep since surgery last night.    Pertinent History  07/29/19 - R unicompartmental knee replacement    Patient Stated Goals  "to get  back to normal"    Currently in Pain?  Yes    Pain Score  3     Pain Location  Knee    Pain Orientation  Right    Pain Descriptors / Indicators  Tightness    Pain Type  Surgical pain;Acute pain    Pain Frequency  Intermittent         [facility] PT Assessment - 08/24/19 0928      Assessment   Medical Diagnosis  R unicompartmental knee replacement    Referring Provider (PT)  Danae Orleans, PA-C Paralee Cancel, MD)    Onset Date/Surgical Date  07/29/19    Next MD Visit  08/25/19      AROM   Right Knee Extension  0    Right Knee Flexion  122      Strength   Right Hip Flexion  4/5    Right Hip Extension  4/5    Right Hip ABduction  4+/5    Right Hip ADduction  4+/5    Left Hip Flexion  5/5    Left Hip Extension  5/5    Left Hip ABduction  5/5    Left Hip ADduction  5/5    Right Knee Flexion  4+/5    Right Knee Extension  4+/5    Left Knee Flexion  5/5    Left Knee Extension  5/5                   [name] Treatment/Exercise - 08/24/19 6812      Ambulation/Gait   Ambulation/Gait Assistance  7: Independent    Assistive device  None    Gait Pattern  Within Functional Limits    Ambulation Surface  Level;Indoor    Stairs Assistance  6: Modified independent (Device/Increase time)    Stair Management Technique  Alternating pattern;Forwards;No rails;One rail Right    Number of Stairs  14    Height of Stairs  7    Gait Comments  Good reciprocal ascent but limited R eccentric control on descent.      Exercises   Exercises  Knee/Hip      Knee/Hip Exercises: Stretches   Production assistant, radio Limitations  prone with strap, 2nd & 3rd rep with bolster under knee to target RF      Knee/Hip Exercises: Aerobic   Recumbent Bike  L2 x 6 min      Knee/Hip Exercises: Standing   Heel Raises  Both;10 reps;2 seconds    Heel Raises Limitations  heel-toe raises on Airex pad    Hip Flexion  Right;Left;15 reps;Stengthening;Knee straight     Hip Flexion Limitations  marching in place on A[employer]x pad - intermittent 1 pole A    SLS  R SLS on blue foam oval 3 x 15 sec - intermittent 1 pole A for balance      Knee/Hip Exercises: Supine   Straight Leg Raises  Right;15 reps;Strengthening    Straight Leg Raises Limitations  2#; no quad lag               PT Short Term Goals - 08/18/19 0934      PT SHORT TERM GOAL #1   Title  Independent with initial HEP    Status  Achieved   08/16/19     PT SHORT TERM GOAL #2   Title  Patient will ambulate with crutches or LRAD with normal step through pattern and increased weight shift to R    Status  Achieved   08/18/19   Target Date  --        PT Long Term Goals - 08/24/19 0937      PT LONG TERM GOAL #1   Title  Independent with ongoing/advanced HEP    Status  Partially Met      PT LONG TERM GOAL #2   Title  R knee AROM >/= 3-120 dg to promote normal gait and stair mechanics    Status  Achieved   08/24/19     PT LONG TERM GOAL #3   Title  R LE strength grossly >/= 4+/5 with no quad laq on SLR for improved stability    Status  Partially Met      PT LONG TERM GOAL #4   Title  Patient will ambulate without AD with normal gait pattern on all surfaces    Status  Achieved   08/20/19     PT LONG TERM GOAL #5   Title  Patient wil negotiate stairs with normal reciprocal step pattern to increase ease of access to all level of home    Status  Partially Met  Plan - 08/24/19 0936    Clinical Impression Statement  Jesus Nunez has demonstrated excellent progress with PT s/p R unicompartmental knee replacement on 07/29/19. He has weaned from AD with gait and is able to ambulate with normal gait pattern on level surfaces, although still notes some instability on uneven surfaces such as walking on his gravel driveway. He is able to navigate stairs reciprocally but continues to demonstrate limited eccentric control on R during stair descent. R knee AROM 0-122 dg with no quad lag on  SLR and overall R LE strength 4/5 to 4+/5. All goals at least partially met at this time. Jesus Nunez will continue to benefit from skilled PT for further balance and proprioceptive training to increase safety and stability on uneven surfaces as well as eccentric quad control to improve control on stair descent.    Comorbidities  OA, DM, h/o lumbar compression fracture, R shoulder fracture s/p pinning, h/o seizures    Rehab Potential  Good    PT Frequency  3x / week   3x/wk x 3-4 weeks, tapering to 2x/wk for duration of POC   PT Duration  8 weeks   6-8 wks   PT Treatment/Interventions  ADLs/Self Care Home Management;Cryotherapy;Electrical Stimulation;Gait training;Stair training;Functional mobility training;Therapeutic activities;Therapeutic exercise;Balance training;Neuromuscular re-education;Patient/family education;Manual techniques;Scar mobilization;Passive range of motion;Dry needling;Taping;Vasopneumatic Device;Joint Manipulations    PT Next Visit Plan  progresion of R knee ROM & LE strengthening, balance and proprioceptive training;  modalities PRN for pain & edema    PT Home Exercise Plan  08/06/19 - quad sets, SLR, AAROM heels slides (supine & seated), SAQ, knee extension prop + quad sets; 08/12/19: counter squat; 08/13/19: patellar mobs, mod thomas hip flexor quad stretch    Consulted and Agree with Plan of Care  Patient       Patient will benefit from skilled therapeutic intervention in order to improve the following deficits and impairments:  Abnormal gait, Decreased activity tolerance, Decreased balance, Decreased endurance, Decreased knowledge of precautions, Decreased knowledge of use of DME, Decreased mobility, Decreased range of motion, Decreased safety awareness, Decreased scar mobility, Decreased strength, Difficulty walking, Increased edema, Impaired perceived functional ability, Impaired flexibility, Improper body mechanics, Postural dysfunction, Pain  Visit Diagnosis: Acute pain of right  knee  Stiffness of right knee, not elsewhere classified  Localized edema  Muscle weakness (generalized)  Other abnormalities of gait and mobility  Difficulty in walking, not elsewhere classified     Problem List Patient Active Problem List   Diagnosis Date Noted  . S/P right UKR 07/29/2019  . Encounter for health maintenance examination with abnormal findings 07/09/2018  . Benign prostatic hyperplasia with urinary frequency 07/09/2018  . Controlled type 2 diabetes mellitus without complication, without long-term current use of insulin ([place]) 07/09/2018    Percival Spanish, PT, MPT 08/24/2019, 2:17 PM  [facility] [address]  [place], [place], [address] Phone: [phone]   Fax:  [phone]  Name: Jesus Nunez MRN: [id number] Date of Birth: [birth year]

## 2019-08-25 ENCOUNTER — Ambulatory Visit: Payer: 59

## 2019-08-25 ENCOUNTER — Other Ambulatory Visit: Payer: Self-pay

## 2019-08-25 DIAGNOSIS — R2689 Other abnormalities of gait and mobility: Secondary | ICD-10-CM

## 2019-08-25 DIAGNOSIS — R600 Localized edema: Secondary | ICD-10-CM

## 2019-08-25 DIAGNOSIS — M25561 Pain in right knee: Secondary | ICD-10-CM | POA: Diagnosis not present

## 2019-08-25 DIAGNOSIS — R262 Difficulty in walking, not elsewhere classified: Secondary | ICD-10-CM

## 2019-08-25 DIAGNOSIS — M6281 Muscle weakness (generalized): Secondary | ICD-10-CM

## 2019-08-25 DIAGNOSIS — M25661 Stiffness of right knee, not elsewhere classified: Secondary | ICD-10-CM

## 2019-08-25 NOTE — Therapy (Signed)
[facility] [address]  [place], [place], [address] Phone: [phone]   Fax:  [phone]  Physical Therapy Treatment  Patient Details  Name: Jesus Nunez MRN: [id number] Date of Birth: 1956-07-04 Referring Provider (PT): Danae Orleans, PA-C Paralee Cancel, MD)   Encounter Date: 08/25/2019  PT End of Session - 08/25/19 0857    Visit Number  10    Number of Visits  20    Date for PT Re-Evaluation  09/29/19    Authorization Type  [employer] - VL: 76    Authorization - Number of Visits  60    PT Start Time  0848    PT Stop Time  0930    PT Time Calculation (min)  42 min    Activity Tolerance  Patient tolerated treatment well    Behavior During Therapy  [facility] for tasks assessed/performed       Past Medical History:  Diagnosis Date  . Arthritis    OA  . Diabetes mellitus without complication ([place])    TYPE 2  . Lumbar stress fracture AGE 63   NO SX DONE, HEALED  . Seizures ([place])    2012, possibly related to drug use. He currently does not take naything for it. He declines meds and is tired.     Past Surgical History:  Procedure Laterality Date  . FRACTURE SURGERY  1978   RIGHT SHOULDER PINNING  . HERNIA REPAIR     UMBILICAL 8828, GROIN FEB 1975  . PARTIAL KNEE ARTHROPLASTY Right 07/29/2019   Procedure: UNICOMPARTMENTAL KNEE Medially;  Surgeon: Paralee Cancel, MD;  Location: [facility];  Service: Orthopedics;  Laterality: Right;  90 mins  . RIGHT SHOULDER PINS REMOVED      There were no vitals filed for this visit.  Subjective Assessment - 08/25/19 0856    Subjective  Pt. reporting he was up on his feet for three and a half hours yesterday and has some soreness today.    Pertinent History  07/29/19 - R unicompartmental knee replacement    Patient Stated Goals  "to get back to normal"    Currently in Pain?  Yes    Pain Score  3     Pain Location  Knee    Pain Orientation  Right    Pain Descriptors / Indicators   Tightness    Pain Type  Surgical pain;Acute pain    Multiple Pain Sites  No                       [name] Treatment/Exercise - 08/25/19 0001      Knee/Hip Exercises: Aerobic   Recumbent Bike  L2 x 6 min      Knee/Hip Exercises: Standing   Hip Flexion  Right;Left;10 reps;Knee bent    Hip Flexion Limitations  yellow looped TB at forefeet     Hip Extension  Right;Left;15 reps    Extension Limitations  leaning over machine bolster in standing with glute 45 dg kickback    Step Down  Right;10 reps;Step Height: 6";Hand Hold: 1    Step Down Limitations  lateral + L heel tap     Other Standing Knee Exercises  Alternating march on foam with 2 ski poles x 10 reps       Knee/Hip Exercises: Supine   Bridges  15 reps    Bridges Limitations  Cues for full hip extension     Straight Leg Raises  Right;15 reps;Strengthening    Straight Leg Raises Limitations  2#       Knee/Hip Exercises: Prone   Hip Extension  Right;10 reps;Strengthening    Hip Extension Limitations  pillow under hips                PT Short Term Goals - 08/18/19 0934      PT SHORT TERM GOAL #1   Title  Independent with initial HEP    Status  Achieved   08/16/19     PT SHORT TERM GOAL #2   Title  Patient will ambulate with crutches or LRAD with normal step through pattern and increased weight shift to R    Status  Achieved   08/18/19   Target Date  --        PT Long Term Goals - 08/24/19 0937      PT LONG TERM GOAL #1   Title  Independent with ongoing/advanced HEP    Status  Partially Met      PT LONG TERM GOAL #2   Title  R knee AROM >/= 3-120 dg to promote normal gait and stair mechanics    Status  Achieved   08/24/19     PT LONG TERM GOAL #3   Title  R LE strength grossly >/= 4+/5 with no quad laq on SLR for improved stability    Status  Partially Met      PT LONG TERM GOAL #4   Title  Patient will ambulate without AD with normal gait pattern on all surfaces    Status   Achieved   08/20/19     PT LONG TERM GOAL #5   Title  Patient wil negotiate stairs with normal reciprocal step pattern to increase ease of access to all level of home    Status  Partially Met            Plan - 08/25/19 1338    Clinical Impression Statement  Jesus Nunez reporting he sees MD later today and plans to ask MD how much more therapy he needs.  Did verbalize he plans to attend session on Friday thus will plan to update HEP on that visit.  Able to progress glute and hip flexion strengthening activities today with pt. verbalizing LE fatigue following.  Demonstrated improved functional quad strength on 6" lateral step-down today.  Reports he was able to do yard work for three and a half hours yesterday and had increased soreness last night which recovered today.  Ended visit with pt. reporting he would rather ice at home.    Personal Factors and Comorbidities  Behavior Pattern;Comorbidity 3+;Past/Current Experience    Comorbidities  OA, DM, h/o lumbar compression fracture, R shoulder fracture s/p pinning, h/o seizures    Rehab Potential  Good    PT Treatment/Interventions  ADLs/Self Care Home Management;Cryotherapy;Electrical Stimulation;Gait training;Stair training;Functional mobility training;Therapeutic activities;Therapeutic exercise;Balance training;Neuromuscular re-education;Patient/family education;Manual techniques;Scar mobilization;Passive range of motion;Dry needling;Taping;Vasopneumatic Device;Joint Manipulations    PT Next Visit Plan  progresion of R knee ROM & LE strengthening, balance and proprioceptive training;  modalities PRN for pain & edema    PT Home Exercise Plan  08/06/19 - quad sets, SLR, AAROM heels slides (supine & seated), SAQ, knee extension prop + quad sets; 08/12/19: counter squat; 08/13/19: patellar mobs, mod thomas hip flexor quad stretch    Consulted and Agree with Plan of Care  Patient       Patient will benefit from skilled therapeutic intervention in order to  improve the following deficits and impairments:  Abnormal gait, Decreased activity tolerance, Decreased balance, Decreased endurance, Decreased knowledge of precautions, Decreased knowledge of use of DME, Decreased mobility, Decreased range of motion, Decreased safety awareness, Decreased scar mobility, Decreased strength, Difficulty walking, Increased edema, Impaired perceived functional ability, Impaired flexibility, Improper body mechanics, Postural dysfunction, Pain  Visit Diagnosis: Acute pain of right knee  Stiffness of right knee, not elsewhere classified  Localized edema  Muscle weakness (generalized)  Other abnormalities of gait and mobility  Difficulty in walking, not elsewhere classified     Problem List Patient Active Problem List   Diagnosis Date Noted  . S/P right UKR 07/29/2019  . Encounter for health maintenance examination with abnormal findings 07/09/2018  . Benign prostatic hyperplasia with urinary frequency 07/09/2018  . Controlled type 2 diabetes mellitus without complication, without long-term current use of insulin ([place]) 07/09/2018    Bess Harvest, PTA 08/25/19 1:49 PM   [facility] [address]  [place], [place], [address] Phone: [phone]   Fax:  [phone]  Name: Jesus Nunez MRN: [id number] Date of Birth: 02/19/1956

## 2019-08-27 ENCOUNTER — Other Ambulatory Visit: Payer: Self-pay

## 2019-08-27 ENCOUNTER — Ambulatory Visit: Payer: 59

## 2019-08-27 DIAGNOSIS — M25561 Pain in right knee: Secondary | ICD-10-CM

## 2019-08-27 DIAGNOSIS — M6281 Muscle weakness (generalized): Secondary | ICD-10-CM

## 2019-08-27 DIAGNOSIS — R600 Localized edema: Secondary | ICD-10-CM

## 2019-08-27 DIAGNOSIS — R262 Difficulty in walking, not elsewhere classified: Secondary | ICD-10-CM

## 2019-08-27 DIAGNOSIS — M25661 Stiffness of right knee, not elsewhere classified: Secondary | ICD-10-CM

## 2019-08-27 DIAGNOSIS — R2689 Other abnormalities of gait and mobility: Secondary | ICD-10-CM

## 2019-08-27 NOTE — Therapy (Addendum)
[facility] [address]  [place], [place], [address] Phone: [phone]   Fax:  [phone]  Physical Therapy Treatment / Discharge Summary  Patient Details  Name: Jesus Nunez MRN: [id number] Date of Birth: 02-Mar-1956 Referring Provider (PT): Danae Orleans, PA-C Paralee Cancel, MD)   Encounter Date: 08/27/2019  PT End of Session - 08/27/19 0811    Visit Number  11    Number of Visits  20    Date for PT Re-Evaluation  09/29/19    Authorization Type  [employer] - VL: 31    Authorization - Number of Visits  10    PT Start Time  0803    PT Stop Time  0845    PT Time Calculation (min)  42 min    Activity Tolerance  Patient tolerated treatment well    Behavior During Therapy  [facility] for tasks assessed/performed       Past Medical History:  Diagnosis Date  . Arthritis    OA  . Diabetes mellitus without complication ([place])    TYPE 2  . Lumbar stress fracture AGE 63   NO SX DONE, HEALED  . Seizures ([place])    2012, possibly related to drug use. He currently does not take naything for it. He declines meds and is tired.     Past Surgical History:  Procedure Laterality Date  . FRACTURE SURGERY  1978   RIGHT SHOULDER PINNING  . HERNIA REPAIR     UMBILICAL 0932, GROIN FEB 1975  . PARTIAL KNEE ARTHROPLASTY Right 07/29/2019   Procedure: UNICOMPARTMENTAL KNEE Medially;  Surgeon: Paralee Cancel, MD;  Location: [facility];  Service: Orthopedics;  Laterality: Right;  90 mins  . RIGHT SHOULDER PINS REMOVED      There were no vitals filed for this visit.  Subjective Assessment - 08/27/19 0808    Subjective  Pt. reporting he had to "crawl on hands and knees" under his house to fix a plumbing leak for three hours.  Was sore last night.  Soreness has improved today.    Pertinent History  07/29/19 - R unicompartmental knee replacement    Patient Stated Goals  "to get back to normal"    Currently in Pain?  Yes    Pain Score  2     Pain  Location  Knee    Pain Orientation  Right    Pain Descriptors / Indicators  Tightness    Pain Type  Surgical pain;Acute pain    Multiple Pain Sites  No         [facility] PT Assessment - 08/27/19 0001      Observation/Other Assessments   Focus on Therapeutic Outcomes (FOTO)   63% (37% limitation)      AROM   AROM Assessment Site  Knee    Right/Left Knee  Right    Right Knee Extension  0    Right Knee Flexion  124      Strength   Right Hip Flexion  4/5    Right Hip Extension  4/5    Right Hip ABduction  4+/5    Right Hip ADduction  4+/5    Left Hip Flexion  5/5    Left Hip Extension  5/5    Left Hip ABduction  5/5    Left Hip ADduction  5/5    Right Knee Flexion  4+/5    Right Knee Extension  4+/5    Left Knee Flexion  5/5  Left Knee Extension  5/5                   [name] Treatment/Exercise - 08/27/19 0001      Self-Care   Self-Care  Other Self-Care Comments    Other Self-Care Comments   Reviewed and updated HEP      Knee/Hip Exercises: Stretches   Production assistant, radio Limitations  prone with strap, 2nd & 3rd rep with bolster under knee to target RF    Knee: Self-Stretch to increase Flexion  Right    Knee: Self-Stretch Limitations  R knee flexion lunge stretch with B UE support at TM 5" x 10 reps       Knee/Hip Exercises: Supine   Bridges  15 reps    Straight Leg Raises  Right;15 reps;Strengthening    Straight Leg Raises Limitations  2#              PT Education - 08/27/19 1345    Education Details  HEP update; lunge stretch , bridge, SLR    Person(s) Educated  Patient    Methods  Explanation;Demonstration;Verbal cues;Handout    Comprehension  Verbalized understanding;Returned demonstration;Verbal cues required       PT Short Term Goals - 08/27/19 1336      PT SHORT TERM GOAL #1   Title  Independent with initial HEP    Status  Achieved   08/16/19     PT SHORT TERM GOAL #2   Title  Patient will  ambulate with crutches or LRAD with normal step through pattern and increased weight shift to R    Status  Achieved   08/18/19       PT Long Term Goals - 08/27/19 1336      PT LONG TERM GOAL #1   Title  Independent with ongoing/advanced HEP    Status  Achieved      PT LONG TERM GOAL #2   Title  R knee AROM >/= 3-120 dg to promote normal gait and stair mechanics    Status  Achieved   08/27/19: 0-124 dg AROM     PT LONG TERM GOAL #3   Title  R LE strength grossly >/= 4+/5 with no quad laq on SLR for improved stability    Status  Partially Met   08/27/19: remains half grade strength deficit in R hip flexion, extension 4/5     PT LONG TERM GOAL #4   Title  Patient will ambulate without AD with normal gait pattern on all surfaces    Status  Achieved   08/20/19     PT LONG TERM GOAL #5   Title  Patient wil negotiate stairs with normal reciprocal step pattern to increase ease of access to all level of home    Status  Partially Met   08/27/19: able to navigate with reciprocal pattern however still with some functional quad, hip weakness noted descending           Plan - 08/27/19 3267    Clinical Impression Statement  Pt. has made good progress with therapy.  Reports MD left further therapy up to him at recent f/u and pt. reporting he wishes to transition to home exercise program as he is busy taking care of mentally impaired son and work.  Supervising PT approving plan to transition to home program.  Pt. opting for 30-day hold from therapy.  Has now achieved or partially achieved all LTGs.  Has achieved LTG #1, #2 demonstrating independence with advanced HEP and able to demo R knee AROM of 0-124 dg.  LTG #3 partially achieved as pt. still demonstrating slight strength deficits in R hip flexors and extensors still 4/5 strength with MMT.  Pt. has achieved LTG #4 able to ambulate on all surfaces and partially achieved LTG #5 able to ascend/descend stairs with handrail reciprocally however  with some remaining functional quad/hip weakness evident.  Pt. progressed well with physical therapy and now on 30-day hold per his request and supervising PT approval.    Personal Factors and Comorbidities  Behavior Pattern;Comorbidity 3+;Past/Current Experience    Comorbidities  OA, DM, h/o lumbar compression fracture, R shoulder fracture s/p pinning, h/o seizures    Rehab Potential  Good    PT Treatment/Interventions  ADLs/Self Care Home Management;Cryotherapy;Electrical Stimulation;Gait training;Stair training;Functional mobility training;Therapeutic activities;Therapeutic exercise;Balance training;Neuromuscular re-education;Patient/family education;Manual techniques;Scar mobilization;Passive range of motion;Dry needling;Taping;Vasopneumatic Device;Joint Manipulations    PT Next Visit Plan  30-day hold    PT Home Exercise Plan  08/06/19 - quad sets, SLR, AAROM heels slides (supine & seated), SAQ, knee extension prop + quad sets; 08/12/19: counter squat; 08/13/19: patellar mobs, mod thomas hip flexor quad stretch    Consulted and Agree with Plan of Care  Patient       Patient will benefit from skilled therapeutic intervention in order to improve the following deficits and impairments:  Abnormal gait, Decreased activity tolerance, Decreased balance, Decreased endurance, Decreased knowledge of precautions, Decreased knowledge of use of DME, Decreased mobility, Decreased range of motion, Decreased safety awareness, Decreased scar mobility, Decreased strength, Difficulty walking, Increased edema, Impaired perceived functional ability, Impaired flexibility, Improper body mechanics, Postural dysfunction, Pain  Visit Diagnosis: Acute pain of right knee  Stiffness of right knee, not elsewhere classified  Localized edema  Muscle weakness (generalized)  Other abnormalities of gait and mobility  Difficulty in walking, not elsewhere classified     Problem List Patient Active Problem List    Diagnosis Date Noted  . S/P right UKR 07/29/2019  . Encounter for health maintenance examination with abnormal findings 07/09/2018  . Benign prostatic hyperplasia with urinary frequency 07/09/2018  . Controlled type 2 diabetes mellitus without complication, without long-term current use of insulin ([place]) 07/09/2018    Bess Harvest, PTA 08/27/19 1:48 PM   [facility] [address]  [place], [place], [address] Phone: [phone]   Fax:  [phone]  Name: Kamari Buch MRN: [id number] Date of Birth: Feb 24, 1956   PHYSICAL THERAPY DISCHARGE SUMMARY  Visits from Start of Care: 11  Current functional level related to goals / functional outcomes:   Refer to above clinical impression for status as of last visit on 08/27/2019. Patient was placed on hold for 30 days and has not needed to return to PT, therefore will proceed with discharge from PT for this episode.   Remaining deficits:   As above.   Education / Equipment:   HEP  Plan: Patient agrees to discharge.  Patient goals were partially met. Patient is being discharged due to being pleased with the current functional level.  ?????     Percival Spanish, PT, MPT 10/04/19, 9:08 AM  [facility] [address]  [place], [place], [address] Phone: [phone]   Fax:  [phone]

## 2019-10-04 ENCOUNTER — Other Ambulatory Visit: Payer: Self-pay

## 2019-10-05 ENCOUNTER — Ambulatory Visit (INDEPENDENT_AMBULATORY_CARE_PROVIDER_SITE_OTHER): Payer: 59 | Admitting: Family Medicine

## 2019-10-05 ENCOUNTER — Encounter: Payer: Self-pay | Admitting: Family Medicine

## 2019-10-05 VITALS — BP 130/80 | HR 94 | Ht 68.0 in | Wt 250.0 lb

## 2019-10-05 DIAGNOSIS — Z23 Encounter for immunization: Secondary | ICD-10-CM | POA: Diagnosis not present

## 2019-10-05 DIAGNOSIS — R350 Frequency of micturition: Secondary | ICD-10-CM

## 2019-10-05 DIAGNOSIS — E119 Type 2 diabetes mellitus without complications: Secondary | ICD-10-CM | POA: Diagnosis not present

## 2019-10-05 DIAGNOSIS — Z0000 Encounter for general adult medical examination without abnormal findings: Secondary | ICD-10-CM

## 2019-10-05 DIAGNOSIS — N401 Enlarged prostate with lower urinary tract symptoms: Secondary | ICD-10-CM

## 2019-10-05 NOTE — Progress Notes (Signed)
Established Patient Office Visit  Subjective:  Patient ID: Jesus Nunez, male    DOB: [birth year]  Age: 63 y.o. MRN: [id number]  CC:  Chief Complaint  Patient presents with  . Annual Exam    HPI Jesus Nunez presents for a complete physical exam as required by his wife's employer.  Patient is nonfasting.  He was last seen by me 15 months ago.  Nevertheless he tells me he has been taking his Glucophage daily.  Hemoglobin A1c checked back in September was 6.0.  He has not seen the eye doctor.  He does not go for regular dental care.  He is overdue for colonoscopy.  Flomax did not help his urine flow.  He tells me that his urine flow is actually improved.  He is experiencing nocturia couple times a night but is consuming fluids just before bed.  He is seeing orthopedics for arthritis of his right knee and hopes to have a replacement soon.  Past Medical History:  Diagnosis Date  . Arthritis    OA  . Diabetes mellitus without complication ([place])    TYPE 2  . Lumbar stress fracture AGE 31   NO SX DONE, HEALED  . 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 He currently does not take naything for it. He declines meds and is tired.     Past Surgical History:  Procedure Laterality Date  . FRACTURE SURGERY  1978   RIGHT SHOULDER PINNING  . HERNIA REPAIR     UMBILICAL 3810, GROIN FEB 1975  . PARTIAL KNEE ARTHROPLASTY Right 07/29/2019   Procedure: UNICOMPARTMENTAL KNEE Medially;  Surgeon: Paralee Cancel, MD;  Location: [facility];  Service: Orthopedics;  Laterality: Right;  90 mins  . RIGHT SHOULDER PINS REMOVED      Family History  Problem Relation Age of Onset  . Hyperlipidemia Mother   . Mental illness Mother     Social History   Socioeconomic History  . Marital status: Married    Spouse name: Not on file  . Number of children: Not on file  . Years of education: Not on file  . Highest education level: Not on file  Occupational History  . Not on file  Social Needs  .  Financial resource strain: Not on file  . Food insecurity    Worry: Not on file    Inability: Not on file  . Transportation needs    Medical: Not on file    Non-medical: Not on file  Tobacco Use  . Smoking status: Never Smoker  . Smokeless tobacco: Never Used  Substance and Sexual Activity  . Alcohol use: No  . Drug use: No  . Sexual activity: Not on file  Lifestyle  . Physical activity    Days per week: Not on file    Minutes per session: Not on file  . Stress: Not on file  Relationships  . Social Herbalist on phone: Not on file    Gets together: Not on file    Attends religious service: Not on file    Active member of club or organization: Not on file    Attends meetings of clubs or organizations: Not on file    Relationship status: Not on file  . Intimate partner violence    Fear of current or ex partner: Not on file    Emotionally abused: Not on file    Physically abused: Not on file    Forced sexual activity:  Not on file  Other Topics Concern  . Not on file  Social History Narrative  . Not on file    Outpatient Medications Prior to Visit  Medication Sig Dispense Refill  . CINNAMON PO Take 2 tablets by mouth daily.    Marland Kitchen GLUCOSAMINE-CHONDROITIN PO Take 2 tablets by mouth daily.    Marland Kitchen HYDROcodone-acetaminophen (NORCO) 7.5-325 MG tablet Take 1-2 tablets by mouth every 4 (four) hours as needed for moderate pain. 60 tablet 0  . Melatonin 5 MG CAPS Take 10 mg by mouth at bedtime.    . metFORMIN (GLUCOPHAGE-XR) 500 MG 24 hr tablet Take 1 tablet (500 mg total) by mouth daily. (Patient taking differently: Take 500 mg by mouth daily with supper. ) 90 tablet 3  . methocarbamol (ROBAXIN) 500 MG tablet Take 1 tablet (500 mg total) by mouth every 6 (six) hours as needed for muscle spasms. 40 tablet 0  . Omega-3 Fatty Acids (FISH OIL BURP-LESS) 500 MG CAPS Take 500 mg by mouth daily.     Marland Kitchen OVER THE COUNTER MEDICATION Take 1 tablet by mouth daily. OTC foot inflammation  TUMERIC 1 DAILY    . OVER THE COUNTER MEDICATION Apply 1 application topically daily as needed (foot pain). otc foot pain cream    . docusate sodium (COLACE) 100 MG capsule Take 1 capsule (100 mg total) by mouth 2 (two) times daily. 28 capsule 0  . ferrous sulfate (FERROUSUL) 325 (65 FE) MG tablet Take 1 tablet (325 mg total) by mouth 3 (three) times daily with meals for 14 days. 42 tablet 0  . polyethylene glycol (MIRALAX / GLYCOLAX) 17 g packet Take 17 g by mouth 2 (two) times daily. 28 packet 0   No facility-administered medications prior to visit.     Allergies  Allergen Reactions  . Benadryl [Diphenhydramine] Hives and Itching    ROS Review of Systems  Constitutional: Negative for chills, diaphoresis, fatigue, fever and unexpected weight change.  HENT: Negative.   Eyes: Negative for photophobia and visual disturbance.  Respiratory: Negative.   Cardiovascular: Negative.   Gastrointestinal: Negative.   Endocrine: Negative for polyphagia and polyuria.  Genitourinary: Negative for difficulty urinating, frequency and urgency.  Musculoskeletal: Positive for arthralgias and gait problem.  Skin: Negative for pallor and rash.  Allergic/Immunologic: Negative for immunocompromised state.  Neurological: Negative for light-headedness.  Hematological: Does not bruise/bleed easily.  Psychiatric/Behavioral: Negative.       Objective:    Physical Exam  Constitutional: He is oriented to person, place, and time. He appears well-developed and well-nourished. No distress.  HENT:  Head: Normocephalic and atraumatic.  Right Ear: External ear normal.  Left Ear: External ear normal.  Mouth/Throat: Abnormal dentition. No oropharyngeal exudate.  Eyes: Pupils are equal, round, and reactive to light. Conjunctivae are normal. Right eye exhibits no discharge. Left eye exhibits no discharge. No scleral icterus.  Neck: No tracheal deviation present. No thyromegaly present.  Cardiovascular: Normal  rate, regular rhythm and normal heart sounds.  Pulmonary/Chest: Effort normal and breath sounds normal. No stridor.  Abdominal: Bowel sounds are normal. He exhibits no distension. There is no abdominal tenderness. There is no rebound and no guarding. A hernia is present. Hernia confirmed positive in the ventral area.  Genitourinary: Rectum:     Guaiac result negative.     No rectal mass, anal fissure, tenderness, external hemorrhoid, internal hemorrhoid or abnormal anal tone.  Prostate is enlarged. Prostate is not tender.  Lymphadenopathy:    He has no cervical  adenopathy.  Neurological: He is alert and oriented to person, place, and time.  Skin: Skin is warm and dry. He is not diaphoretic.  Psychiatric: He has a normal mood and affect. His behavior is normal.    BP 130/80   Pulse 94   Ht '5\' 8"'$  (1.727 m)   Wt 250 lb (113.4 kg)   SpO2 95%   BMI 38.01 kg/m  Wt Readings from Last 3 Encounters:  10/05/19 250 lb (113.4 kg)  07/29/19 256 lb (116.1 kg)  07/23/19 256 lb (116.1 kg)   BP Readings from Last 3 Encounters:  10/05/19 130/80  07/29/19 119/60  07/23/19 140/70   Guideline developer:  UpToDate (see UpToDate for funding source) Date Released: June 2014  Health Maintenance Due  Topic Date Due  . Hepatitis C Screening  1956-05-31  . PNEUMOCOCCAL POLYSACCHARIDE VACCINE AGE 81-64 HIGH RISK  09/18/1958  . FOOT EXAM  09/18/1966  . OPHTHALMOLOGY EXAM  09/18/1966  . HIV Screening  09/19/1971  . TETANUS/TDAP  09/19/1975  . COLONOSCOPY  09/18/2006  . INFLUENZA VACCINE  06/12/2019  . URINE MICROALBUMIN  07/10/2019    There are no preventive care reminders to display for this patient.  No results found for: TSH Lab Results  Component Value Date   WBC 7.6 07/23/2019   HGB 16.4 07/23/2019   HCT 50.1 07/23/2019   MCV 91.9 07/23/2019   PLT 253 07/23/2019   Lab Results  Component Value Date   NA 139 07/23/2019   K 4.2 07/23/2019   CO2 27 07/23/2019   GLUCOSE 89 07/23/2019    BUN 30 (H) 07/23/2019   CREATININE 1.07 07/23/2019   BILITOT 0.6 07/09/2018   ALKPHOS 68 07/09/2018   AST 22 07/09/2018   ALT 20 07/09/2018   PROT 7.0 07/09/2018   ALBUMIN 4.3 07/09/2018   CALCIUM 9.3 07/23/2019   ANIONGAP 6 07/23/2019   GFR 78.71 07/09/2018   Lab Results  Component Value Date   CHOL 141 07/09/2018   Lab Results  Component Value Date   HDL 46.10 07/09/2018   Lab Results  Component Value Date   LDLCALC 81 07/09/2018   Lab Results  Component Value Date   TRIG 69.0 07/09/2018   Lab Results  Component Value Date   CHOLHDL 3 07/09/2018   Lab Results  Component Value Date   HGBA1C 6.0 (H) 07/23/2019      Assessment & Plan:   Problem List Items Addressed This Visit      Endocrine   Controlled type 2 diabetes mellitus without complication, without long-term current use of insulin (HCC)   Relevant Orders   Comp Met (CMET)   Urinalysis, Routine w reflex microscopic   Ambulatory referral to Ophthalmology     Other   Healthcare maintenance   Relevant Orders   CBC   Comp Met (CMET)   Lipid Profile   PSA   Urinalysis, Routine w reflex microscopic   Urine Microalbumin w/creat. ratio   Hepatitis C Antibody   HIV antibody (with reflex)   Ambulatory referral to Gastroenterology   Benign prostatic hyperplasia with urinary frequency - Primary   Relevant Orders   PSA   Morbid obesity ([place])      No orders of the defined types were placed in this encounter.   Follow-up: Return in about 6 months (around 04/03/2020), or return fasting for ordered blood work in the next few weeks..    Encouraged patient to go for regular dental care.  He was given  information on health maintenance and disease prevention.  Also given information on BMI and obesity.  Advised to see the dentist for regular dental care.  Advised him to avoid fluids a couple hours before bedtime.

## 2019-10-05 NOTE — Patient Instructions (Signed)
BMI for Adults  Body mass index (BMI) is a number that is calculated from a person's weight and height. BMI may help to estimate how much of a person's weight is composed of fat. BMI can help identify those who may be at higher risk for certain medical problems. How is BMI used with adults? BMI is used as a screening tool to identify possible weight problems. It is used to check whether a person is obese, overweight, healthy weight, or underweight. How is BMI calculated? BMI measures your weight and compares it to your height. This can be done either in [place] ([place] or metric measurements. Note that charts are available to help you find your BMI quickly and easily without having to do these calculations yourself. To calculate your BMI in English (U[place] measurements, your health care provider will: 1. Measure your weight in pounds (lb). 2. Multiply the number of pounds by 703. ? For example, for a person who weighs 180 lb, multiply that number by 703, which equals 126,540. 3. Measure your height in inches (in). Then multiply that number by itself to get a measurement called "inches squared." ? For example, for a person who is 70 in tall, the "inches squared" measurement is 70 in x 70 in, which equals 4900 inches squared. 4. Divide the total from Step 2 (number of lb x 703) by the total from Step 3 (inches squared): 126,540  4900 = 25.8. This is your BMI. To calculate your BMI in metric measurements, your health care provider will: 1. Measure your weight in kilograms (kg). 2. Measure your height in meters (m). Then multiply that number by itself to get a measurement called "meters squared." ? For example, for a person who is 1.75 m tall, the "meters squared" measurement is 1.75 m x 1.75 m, which is equal to 3.1 meters squared. 3. Divide the number of kilograms (your weight) by the meters squared number. In this example: 70  3.1 = 22.6. This is your BMI. How is BMI interpreted? To interpret your  results, your health care provider will use BMI charts to identify whether you are underweight, normal weight, overweight, or obese. The following guidelines will be used:  Underweight: BMI less than 18.5.  Normal weight: BMI between 18.5 and 24.9.  Overweight: BMI between 25 and 29.9.  Obese: BMI of 30 and above. Please note:  Weight includes both fat and muscle, so someone with a muscular build, such as an athlete, may have a BMI that is higher than 24.9. In cases like these, BMI is not an accurate measure of body fat.  To determine if excess body fat is the cause of a BMI of 25 or higher, further assessments may need to be done by a health care provider.  BMI is usually interpreted in the same way for men and women. Why is BMI a useful tool? BMI is useful in two ways:  Identifying a weight problem that may be related to a medical condition, or that may increase the risk for medical problems.  Promoting lifestyle and diet changes in order to reach a healthy weight. Summary  Body mass index (BMI) is a number that is calculated from a person's weight and height.  BMI may help to estimate how much of a person's weight is composed of fat. BMI can help identify those who may be at higher risk for certain medical problems.  BMI can be measured using English measurements or metric measurements.  To interpret your results, your  health care provider will use BMI charts to identify whether you are underweight, normal weight, overweight, or obese. This information is not intended to replace advice given to you by your health care provider. Make sure you discuss any questions you have with your health care provider. Document Released: 07/09/2004 Document Revised: 10/10/2017 Document Reviewed: 09/10/2017 Elsevier Patient Education  2020 [employer].  Obesity, Adult Obesity is the condition of having too much total body fat. Being overweight or obese means that your weight is greater than  what is considered healthy for your body size. Obesity is determined by a measurement called BMI. BMI is an estimate of body fat and is calculated from height and weight. For adults, a BMI of 30 or higher is considered obese. Obesity can lead to other health concerns and major illnesses, including:  Stroke.  Coronary artery disease (CAD).  Type 2 diabetes.  Some types of cancer, including cancers of the colon, breast, uterus, and gallbladder.  Osteoarthritis.  High blood pressure (hypertension).  High cholesterol.  Sleep apnea.  Gallbladder stones.  Infertility problems. What are the causes? Common causes of this condition include:  Eating daily meals that are high in calories, sugar, and fat.  Being born with genes that may make you more likely to become obese.  Having a medical condition that causes obesity, including: ? Hypothyroidism. ? Polycystic ovarian syndrome (PCOS). ? Binge-eating disorder. ? Cushing syndrome.  Taking certain medicines, such as steroids, antidepressants, and seizure medicines.  Not being physically active (sedentary lifestyle).  Not getting enough sleep.  Drinking high amounts of sugar-sweetened beverages, such as soft drinks. What increases the risk? The following factors may make you more likely to develop this condition:  Having a family history of obesity.  Being a woman of African American descent.  Being a man of Hispanic descent.  Living in an area with limited access to: ? [place]ilda [place], recreation centers, or sidewalks. ? Healthy food choices, such as grocery stores and farmers' markets. What are the signs or symptoms? The main sign of this condition is having too much body fat. How is this diagnosed? This condition is diagnosed based on:  Your BMI. If you are an adult with a BMI of 30 or higher, you are considered obese.  Your waist circumference. This measures the distance around your waistline.  Your skinfold thickness.  Your health care provider may gently pinch a fold of your skin and measure it. You may have other tests to check for underlying conditions. How is this treated? Treatment for this condition often includes changing your lifestyle. Treatment may include some or all of the following:  Dietary changes. This may include developing a healthy meal plan.  Regular physical activity. This may include activity that causes your heart to beat faster (aerobic exercise) and strength training. Work with your health care provider to design an exercise program that works for you.  Medicine to help you lose weight if you are unable to lose 1 pound a week after 6 weeks of healthy eating and more physical activity.  Treating conditions that cause the obesity (underlying conditions).  Surgery. Surgical options may include gastric banding and gastric bypass. Surgery may be done if: ? Other treatments have not helped to improve your condition. ? You have a BMI of 40 or higher. ? You have life-threatening health problems related to obesity. Follow these instructions at home: Eating and drinking   Follow recommendations from your health care provider about what you eat and drink.  Your health care provider may advise you to: ? Limit fast food, sweets, and processed snack foods. ? Choose low-fat options, such as low-fat milk instead of whole milk. ? Eat 5 or more servings of fruits or vegetables every day. ? Eat at home more often. This gives you more control over what you eat. ? Choose healthy foods when you eat out. ? Learn to read food labels. This will help you understand how much food is considered 1 serving. ? Learn what a healthy serving size is. ? Keep low-fat snacks available. ? Limit sugary drinks, such as soda, fruit juice, sweetened iced tea, and flavored milk.  Drink enough water to keep your urine pale yellow.  Do not follow a fad diet. Fad diets can be unhealthy and even dangerous. Physical  activity  Exercise regularly, as told by your health care provider. ? Most adults should get up to 150 minutes of moderate-intensity exercise every week. ? Ask your health care provider what types of exercise are safe for you and how often you should exercise.  Warm up and stretch before being active.  Cool down and stretch after being active.  Rest between periods of activity. Lifestyle  Work with your health care provider and a dietitian to set a weight-loss goal that is healthy and reasonable for you.  Limit your screen time.  Find ways to reward yourself that do not involve food.  Do not drink alcohol if: ? Your health care provider tells you not to drink. ? You are pregnant, may be pregnant, or are planning to become pregnant.  If you drink alcohol: ? Limit how much you use to:  0-1 drink a day for women.  0-2 drinks a day for men. ? Be aware of how much alcohol is in your drink. In the [place]., one drink equals one 12 oz bottle of beer (355 mL), one 5 oz glass of wine (148 mL), or one 1 oz glass of hard liquor (44 mL). General instructions  Keep a weight-loss journal to keep track of the food you eat and how much exercise you get.  Take over-the-counter and prescription medicines only as told by your health care provider.  Take vitamins and supplements only as told by your health care provider.  Consider joining a support group. Your health care provider may be able to recommend a support group.  Keep all follow-up visits as told by your health care provider. This is important. Contact a health care provider if:  You are unable to meet your weight loss goal after 6 weeks of dietary and lifestyle changes. Get help right away if you are having:  Trouble breathing.  Suicidal thoughts or behaviors. Summary  Obesity is the condition of having too much total body fat.  Being overweight or obese means that your weight is greater than what is considered healthy for  your body size.  Work with your health care provider and a dietitian to set a weight-loss goal that is healthy and reasonable for you.  Exercise regularly, as told by your health care provider. Ask your health care provider what types of exercise are safe for you and how often you should exercise. This information is not intended to replace advice given to you by your health care provider. Make sure you discuss any questions you have with your health care provider. Document Released: 12/05/2004 Document Revised: 07/02/2018 Document Reviewed: 07/02/2018 Elsevier Patient Education  2020 Elsevier Inc.  Preventive Care 57-87 Years Old, Male Preventive  care refers to lifestyle choices and visits with your health care provider that can promote health and wellness. This includes:  A yearly physical exam. This is also called an annual well check.  Regular dental and eye exams.  Immunizations.  Screening for certain conditions.  Healthy lifestyle choices, such as eating a healthy diet, getting regular exercise, not using drugs or products that contain nicotine and tobacco, and limiting alcohol use. What can I expect for my preventive care visit? Physical exam Your health care provider will check:  Height and weight. These may be used to calculate body mass index (BMI), which is a measurement that tells if you are at a healthy weight.  Heart rate and blood pressure.  Your skin for abnormal spots. Counseling Your health care provider may ask you questions about:  Alcohol, tobacco, and drug use.  Emotional well-being.  Home and relationship well-being.  Sexual activity.  Eating habits.  Work and work Statistician. What immunizations do I need?  Influenza (flu) vaccine  This is recommended every year. Tetanus, diphtheria, and pertussis (Tdap) vaccine  You may need a Td booster every 10 years. Varicella (chickenpox) vaccine  You may need this vaccine if you have not already  been vaccinated. Zoster (shingles) vaccine  You may need this after age 28. Measles, mumps, and rubella (MMR) vaccine  You may need at least one dose of MMR if you were born in 1957 or later. You may also need a second dose. Pneumococcal conjugate (PCV13) vaccine  You may need this if you have certain conditions and were not previously vaccinated. Pneumococcal polysaccharide (PPSV23) vaccine  You may need one or two doses if you smoke cigarettes or if you have certain conditions. Meningococcal conjugate (MenACWY) vaccine  You may need this if you have certain conditions. Hepatitis A vaccine  You may need this if you have certain conditions or if you travel or work in places where you may be exposed to hepatitis A. Hepatitis B vaccine  You may need this if you have certain conditions or if you travel or work in places where you may be exposed to hepatitis B. Haemophilus influenzae type b (Hib) vaccine  You may need this if you have certain risk factors. Human papillomavirus (HPV) vaccine  If recommended by your health care provider, you may need three doses over 6 months. You may receive vaccines as individual doses or as more than one vaccine together in one shot (combination vaccines). Talk with your health care provider about the risks and benefits of combination vaccines. What tests do I need? Blood tests  Lipid and cholesterol levels. These may be checked every 5 years, or more frequently if you are over 58 years old.  Hepatitis C test.  Hepatitis B test. Screening  Lung cancer screening. You may have this screening every year starting at age 10 if you have a 30-pack-year history of smoking and currently smoke or have quit within the past 15 years.  Prostate cancer screening. Recommendations will vary depending on your family history and other risks.  Colorectal cancer screening. All adults should have this screening starting at age 59 and continuing until age 43. Your  health care provider may recommend screening at age 59 if you are at increased risk. You will have tests every 1-10 years, depending on your results and the type of screening test.  Diabetes screening. This is done by checking your blood sugar (glucose) after you have not eaten for a while (fasting). You may  have this done every 1-3 years.  Sexually transmitted disease (STD) testing. Follow these instructions at home: Eating and drinking  Eat a diet that includes fresh fruits and vegetables, whole grains, lean protein, and low-fat dairy products.  Take vitamin and mineral supplements as recommended by your health care provider.  Do not drink alcohol if your health care provider tells you not to drink.  If you drink alcohol: ? Limit how much you have to 0-2 drinks a day. ? Be aware of how much alcohol is in your drink. In the [place]., one drink equals one 12 oz bottle of beer (355 mL), one 5 oz glass of wine (148 mL), or one 1 oz glass of hard liquor (44 mL). Lifestyle  Take daily care of your teeth and gums.  Stay active. Exercise for at least 30 minutes on 5 or more days each week.  Do not use any products that contain nicotine or tobacco, such as cigarettes, e-cigarettes, and chewing tobacco. If you need help quitting, ask your health care provider.  If you are sexually active, practice safe sex. Use a condom or other form of protection to prevent STIs (sexually transmitted infections).  Talk with your health care provider about taking a low-dose aspirin every day starting at age 38. What's next?  Go to your health care provider once a year for a well check visit.  Ask your health care provider how often you should have your eyes and teeth checked.  Stay up to date on all vaccines. This information is not intended to replace advice given to you by your health care provider. Make sure you discuss any questions you have with your health care provider. Document Released: 11/24/2015  Document Revised: 10/22/2018 Document Reviewed: 10/22/2018 Elsevier Patient Education  2020 [place] Maintenance, Male Adopting a healthy lifestyle and getting preventive care are important in promoting health and wellness. Ask your health care provider about:  The right schedule for you to have regular tests and exams.  Things you can do on your own to prevent diseases and keep yourself healthy. What should I know about diet, weight, and exercise? Eat a healthy diet   Eat a diet that includes plenty of vegetables, fruits, low-fat dairy products, and lean protein.  Do not eat a lot of foods that are high in solid fats, added sugars, or sodium. Maintain a healthy weight Body mass index (BMI) is a measurement that can be used to identify possible weight problems. It estimates body fat based on height and weight. Your health care provider can help determine your BMI and help you achieve or maintain a healthy weight. Get regular exercise Get regular exercise. This is one of the most important things you can do for your health. Most adults should:  Exercise for at least 150 minutes each week. The exercise should increase your heart rate and make you sweat (moderate-intensity exercise).  Do strengthening exercises at least twice a week. This is in addition to the moderate-intensity exercise.  Spend less time sitting. Even light physical activity can be beneficial. Watch cholesterol and blood lipids Have your blood tested for lipids and cholesterol at 63 years of age, then have this test every 5 years. You may need to have your cholesterol levels checked more often if:  Your lipid or cholesterol levels are high.  You are older than 63 years of age.  You are at high risk for heart disease. What should I know about cancer screening? Many types  of cancers can be detected early and may often be prevented. Depending on your health history and family history, you may need to have  cancer screening at various ages. This may include screening for:  Colorectal cancer.  Prostate cancer.  Skin cancer.  Lung cancer. What should I know about heart disease, diabetes, and high blood pressure? Blood pressure and heart disease  High blood pressure causes heart disease and increases the risk of stroke. This is more likely to develop in people who have high blood pressure readings, are of African descent, or are overweight.  Talk with your health care provider about your target blood pressure readings.  Have your blood pressure checked: ? Every 3-5 years if you are 49-62 years of age. ? Every year if you are 60 years old or older.  If you are between the ages of 34 and 51 and are a current or former smoker, ask your health care provider if you should have a one-time screening for abdominal aortic aneurysm (AAA). Diabetes Have regular diabetes screenings. This checks your fasting blood sugar level. Have the screening done:  Once every three years after age 51 if you are at a normal weight and have a low risk for diabetes.  More often and at a younger age if you are overweight or have a high risk for diabetes. What should I know about preventing infection? Hepatitis B If you have a higher risk for hepatitis B, you should be screened for this virus. Talk with your health care provider to find out if you are at risk for hepatitis B infection. Hepatitis C Blood testing is recommended for:  Everyone born from [birth year] through 1965.  Anyone with known risk factors for hepatitis C. Sexually transmitted infections (STIs)  You should be screened each year for STIs, including gonorrhea and chlamydia, if: ? You are sexually active and are younger than 63 years of age. ? You are older than 63 years of age and your health care provider tells you that you are at risk for this type of infection. ? Your sexual activity has changed since you were last screened, and you are at increased  risk for chlamydia or gonorrhea. Ask your health care provider if you are at risk.  Ask your health care provider about whether you are at high risk for HIV. Your health care provider may recommend a prescription medicine to help prevent HIV infection. If you choose to take medicine to prevent HIV, you should first get tested for HIV. You should then be tested every 3 months for as long as you are taking the medicine. Follow these instructions at home: Lifestyle  Do not use any products that contain nicotine or tobacco, such as cigarettes, e-cigarettes, and chewing tobacco. If you need help quitting, ask your health care provider.  Do not use street drugs.  Do not share needles.  Ask your health care provider for help if you need support or information about quitting drugs. Alcohol use  Do not drink alcohol if your health care provider tells you not to drink.  If you drink alcohol: ? Limit how much you have to 0-2 drinks a day. ? Be aware of how much alcohol is in your drink. In the [place] one drink equals one 12 oz bottle of beer (355 mL), one 5 oz glass of wine (148 mL), or one 1 oz glass of hard liquor (44 mL). General instructions  Schedule regular health, dental, and eye exams.  Stay current with  your vaccines.  Tell your health care provider if: ? You often feel depressed. ? You have ever been abused or do not feel safe at home. Summary  Adopting a healthy lifestyle and getting preventive care are important in promoting health and wellness.  Follow your health care provider's instructions about healthy diet, exercising, and getting tested or screened for diseases.  Follow your health care provider's instructions on monitoring your cholesterol and blood pressure. This information is not intended to replace advice given to you by your health care provider. Make sure you discuss any questions you have with your health care provider. Document Released: 04/25/2008 Document  Revised: 10/21/2018 Document Reviewed: 10/21/2018 Elsevier Patient Education  2020 [employer].

## 2019-10-05 NOTE — Addendum Note (Signed)
Addended by: Nathanial Millman E on: 10/05/2019 11:40 AM   Modules accepted: Orders

## 2019-10-06 ENCOUNTER — Telehealth: Payer: Self-pay

## 2019-10-06 NOTE — Telephone Encounter (Signed)
ROI fax to [facility] for patient records

## 2019-10-11 ENCOUNTER — Other Ambulatory Visit (INDEPENDENT_AMBULATORY_CARE_PROVIDER_SITE_OTHER): Payer: 59

## 2019-10-11 ENCOUNTER — Other Ambulatory Visit: Payer: Self-pay

## 2019-10-11 DIAGNOSIS — R350 Frequency of micturition: Secondary | ICD-10-CM | POA: Diagnosis not present

## 2019-10-11 DIAGNOSIS — Z0000 Encounter for general adult medical examination without abnormal findings: Secondary | ICD-10-CM

## 2019-10-11 DIAGNOSIS — N401 Enlarged prostate with lower urinary tract symptoms: Secondary | ICD-10-CM

## 2019-10-11 DIAGNOSIS — E119 Type 2 diabetes mellitus without complications: Secondary | ICD-10-CM | POA: Diagnosis not present

## 2019-10-11 LAB — COMPREHENSIVE METABOLIC PANEL
ALT: 23 U/L (ref 0–53)
AST: 18 U/L (ref 0–37)
Albumin: 3.9 g/dL (ref 3.5–5.2)
Alkaline Phosphatase: 79 U/L (ref 39–117)
BUN: 27 mg/dL — ABNORMAL HIGH (ref 6–23)
CO2: 26 mEq/L (ref 19–32)
Calcium: 9.4 mg/dL (ref 8.4–10.5)
Chloride: 103 mEq/L (ref 96–112)
Creatinine, Ser: 0.91 mg/dL (ref 0.40–1.50)
GFR: 84.13 mL/min (ref >60.00)
Glucose, Bld: 116 mg/dL — ABNORMAL HIGH (ref 70–99)
Potassium: 4.5 mEq/L (ref 3.5–5.1)
Sodium: 137 mEq/L (ref 135–145)
Total Bilirubin: 0.5 mg/dL (ref 0.2–1.2)
Total Protein: 7 g/dL (ref 6.0–8.3)

## 2019-10-11 LAB — URINALYSIS, ROUTINE W REFLEX MICROSCOPIC
Bilirubin Urine: NEGATIVE
Hgb urine dipstick: NEGATIVE
Ketones, ur: NEGATIVE
Leukocytes,Ua: NEGATIVE
Nitrite: NEGATIVE
RBC / HPF: NONE SEEN (ref 0–?)
Specific Gravity, Urine: 1.025 (ref 1.000–1.030)
Total Protein, Urine: NEGATIVE
Urine Glucose: NEGATIVE
Urobilinogen, UA: 0.2 (ref 0.0–1.0)
pH: 5 (ref 5.0–8.0)

## 2019-10-11 LAB — LIPID PANEL
Cholesterol: 140 mg/dL (ref 0–200)
HDL: 41.7 mg/dL (ref >39.00)
LDL Cholesterol: 84 mg/dL (ref 0–99)
NonHDL: 97.96
Total CHOL/HDL Ratio: 3
Triglycerides: 71 mg/dL (ref 0.0–149.0)
VLDL: 14.2 mg/dL (ref 0.0–40.0)

## 2019-10-11 LAB — CBC
HCT: 49.5 % (ref 39.0–52.0)
Hemoglobin: 16.3 g/dL (ref 13.0–17.0)
MCHC: 33 g/dL (ref 30.0–36.0)
MCV: 90 fl (ref 78.0–100.0)
Platelets: 261 10*3/uL (ref 150.0–400.0)
RBC: 5.5 Mil/uL (ref 4.22–5.81)
RDW: 14 % (ref 11.5–15.5)
WBC: 7.5 10*3/uL (ref 4.0–10.5)

## 2019-10-11 LAB — MICROALBUMIN / CREATININE URINE RATIO
Creatinine,U: 75.3 mg/dL
Microalb Creat Ratio: 4.9 mg/g (ref 0.0–30.0)
Microalb, Ur: 3.7 mg/dL — ABNORMAL HIGH (ref 0.0–1.9)

## 2019-10-11 LAB — PSA: PSA: 2.68 ng/mL (ref 0.10–4.00)

## 2019-10-12 LAB — HIV ANTIBODY (ROUTINE TESTING W REFLEX): HIV 1&2 Ab, 4th Generation: NONREACTIVE

## 2019-10-12 LAB — HEPATITIS C ANTIBODY
Hepatitis C Ab: NONREACTIVE
SIGNAL TO CUT-OFF: 0.02 (ref <1.00)

## 2019-10-29 NOTE — Telephone Encounter (Signed)
Rec'd from [facility] forwarded 16 pages to [facility] Provider

## 2019-11-22 LAB — HM DIABETES EYE EXAM

## 2019-12-23 ENCOUNTER — Telehealth: Payer: Self-pay | Admitting: Gastroenterology

## 2019-12-23 NOTE — Telephone Encounter (Signed)
Patient referred by Dr. [name] for colonoscopy for [facility] screening.  Received copy of previous GI records.  -Colonoscopy completed 08/26/2014 by Dr. Noe Gens at [facility] and notable for single 11 mm polyp in the ascending colon (pathology: Polypoid reactive colonic mucosa).  Due to inadequate/poor prep, recommended repeat in 3 years.  No other records sent for review.  Given inadequate prep during last colonoscopy in 2015, agree with previous recommendation for short interval surveillance/screening.  Okay to schedule direct for colonoscopy with me in [facility].

## 2019-12-24 NOTE — Telephone Encounter (Signed)
Thanks for contacting the patient. We reviewed the prior records as soon as received from his previous GI to ensure that he was receiving appropriate care. Perfectly fine for him to seek his GI care elsewhere.

## 2019-12-24 NOTE — Telephone Encounter (Signed)
I spoke to this patient this morning to schedule a direct colonoscopy. He became very angry stating "this is a joke. I should of had this done last year,how does it take this long for someone to contact me" He commentated on how disorganized our office and the health care system was.He stated he does not have  plans on having a colonoscopy done anytime soon with [place]. Just an FYI.  Thanks, [employer]

## 2020-07-12 ENCOUNTER — Other Ambulatory Visit: Payer: Self-pay | Admitting: Family Medicine

## 2020-07-12 DIAGNOSIS — E119 Type 2 diabetes mellitus without complications: Secondary | ICD-10-CM

## 2020-08-01 NOTE — Telephone Encounter (Signed)
Appt scheduled

## 2020-08-14 ENCOUNTER — Other Ambulatory Visit (HOSPITAL_COMMUNITY)
Admission: RE | Admit: 2020-08-14 | Discharge: 2020-08-14 | Disposition: A | Discharge status: Home / Self Care | Payer: 59 | Source: Ambulatory Visit | Attending: Orthopedic Surgery | Admitting: Orthopedic Surgery

## 2020-08-14 ENCOUNTER — Other Ambulatory Visit: Payer: Self-pay

## 2020-08-14 ENCOUNTER — Encounter (HOSPITAL_COMMUNITY): Payer: Self-pay | Admitting: Orthopedic Surgery

## 2020-08-14 DIAGNOSIS — Z20822 Contact with and (suspected) exposure to covid-19: Secondary | ICD-10-CM | POA: Diagnosis not present

## 2020-08-14 DIAGNOSIS — E119 Type 2 diabetes mellitus without complications: Secondary | ICD-10-CM | POA: Diagnosis not present

## 2020-08-14 DIAGNOSIS — Z96651 Presence of right artificial knee joint: Secondary | ICD-10-CM | POA: Diagnosis not present

## 2020-08-14 DIAGNOSIS — Y838 Other surgical procedures as the cause of abnormal reaction of the patient, or of later complication, without mention of misadventure at the time of the procedure: Secondary | ICD-10-CM | POA: Diagnosis not present

## 2020-08-14 DIAGNOSIS — Z01812 Encounter for preprocedural laboratory examination: Secondary | ICD-10-CM | POA: Diagnosis not present

## 2020-08-14 DIAGNOSIS — Z7984 Long term (current) use of oral hypoglycemic drugs: Secondary | ICD-10-CM | POA: Diagnosis not present

## 2020-08-14 DIAGNOSIS — T8453XA Infection and inflammatory reaction due to internal right knee prosthesis, initial encounter: Secondary | ICD-10-CM | POA: Diagnosis present

## 2020-08-14 LAB — SARS CORONAVIRUS 2 (TAT 6-24 HRS): SARS Coronavirus 2: NEGATIVE

## 2020-08-14 NOTE — H&P (Signed)
Jesus Dollens Sr. is an 64 y.o. male.    Chief Complaint:    Infected right UKR  Procedure:  I&D with poly exchange of the right UKR  HPI: Jesus Nunez is 1 year s/p right medial UKR. His early post-operative course was complicated by lower extremity edema and anterior knee erythema, and he was treated with Keflex which resolved these symptoms. He had been tolerating exercise and doing well until 08/10/2020, when he had increased swelling and difficulty bearing weight secondary to pain. He has a large effusion today, and this was aspirated of 55 cc purulent fluid. We sent the fluid for synovasure culture today, and discussed the high likelihood of infection based on the appearance of this fluid. He had a tooth extracted a few months ago, but took antibiotics prior to this procedure. Otherwise, he has not been sick or had recent procedures. He is borderline diabetic, on metformin.   Dr. Charlann Boxer discussed with him the treatment options, including I&D with poly exchange vs resection. He does wish to proceed with right knee I&D and poly exchange, followed by PICC line placement with IV antibiotics. We will start him on oral Keflex 500 QID as well as Norco 5 mg for pain control until surgery.  He is afebrile today, but we discussed that he is to go to [facility] promptly if he has fever >100.2 or feels he is worsening. All questions welcomed and addressed. Risks, benefits and expectations were discussed with the patient.  Risks including but not limited to the risk of anesthesia, blood clots, nerve damage, blood vessel damage, failure of the prosthesis, infection and up to and including death.  Patient understand the risks, benefits and expectations and wishes to proceed with surgery.    PCP: Mliss Sax, MD  D/C Plans:       Home   Post-op Meds:       No Rx given   Tranexamic Acid:      To be given - IV   Decadron:      Is to be given  FYI:      ASA  Norco    PMH: Past Medical  History:  Diagnosis Date  . Arthritis    OA  . Diabetes mellitus without complication (HCC)    TYPE 2  . History of colon polyps 2015  . Lumbar stress fracture AGE 40   NO SX DONE, HEALED  . 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 He currently does not take nothing for it. He declines meds and is tired.     PSH: Past Surgical History:  Procedure Laterality Date  . FRACTURE SURGERY  1978   RIGHT SHOULDER PINNING  . HERNIA REPAIR     UMBILICAL 2017, GROIN FEB 1975  . PARTIAL KNEE ARTHROPLASTY Right 07/29/2019   Procedure: UNICOMPARTMENTAL KNEE Medially;  Surgeon: [name], MD;  Location: [facility];  Service: Orthopedics;  Laterality: Right;  90 mins  . RIGHT SHOULDER PINS REMOVED      Social History:  reports that he has never smoked. He has never used smokeless tobacco. He reports that he does not drink alcohol and does not use drugs.  Allergies:  Allergies  Allergen Reactions  . Benadryl [Diphenhydramine] Hives and Itching    Medications: No current facility-administered medications for this encounter.   Current Outpatient Medications  Medication Sig Dispense Refill  . cephALEXin (KEFLEX) 500 MG capsule Take 500 mg by mouth 4 (four)  times daily.    . diclofenac (VOLTAREN) 75 MG EC tablet Take 75 mg by mouth 2 (two) times daily.    . Glucosamine-Chondroitin (OSTEO BI-FLEX REGULAR STRENGTH PO) Take 2 tablets by mouth daily.    Marland Kitchen HYDROcodone-acetaminophen (NORCO/VICODIN) 5-325 MG tablet Take 2 tablets by mouth every 6 (six) hours as needed for moderate pain.    Marland Kitchen ibuprofen (ADVIL) 200 MG tablet Take 400 mg by mouth every 8 (eight) hours as needed (for pain.).    Marland Kitchen metFORMIN (GLUCOPHAGE-XR) 500 MG 24 hr tablet Take 1 tablet (500 mg total) by mouth daily with supper. 90 tablet 0  . Omega-3 Fatty Acids (FISH OIL ULTRA) 1400 MG CAPS Take 1,400 mg by mouth daily.    Marland Kitchen OVER THE COUNTER MEDICATION Take 2 capsules by mouth daily. Cinsulin (Cinnamon Chromium Picolinate  Vitamin D3)    . Melatonin 5 MG CAPS Take 10 mg by mouth at bedtime.    . TURMERIC PO Take 2,000 mg by mouth daily. 1000 MG/CAPSULE      Results for orders placed or performed during the hospital encounter of 08/14/20 (from the past 48 hour(s))  SARS CORONAVIRUS 2 (TAT 6-24 HRS) Nasopharyngeal Nasopharyngeal Swab     Status: None   Collection Time: 08/14/20 11:41 AM   Specimen: Nasopharyngeal Swab  Result Value Ref Range   SARS Coronavirus 2 NEGATIVE NEGATIVE    Comment: (NOTE) SARS-CoV-2 target nucleic acids are NOT DETECTED.  The SARS-CoV-2 RNA is generally detectable in upper and lower respiratory specimens during the acute phase of infection. Negative results do not preclude SARS-CoV-2 infection, do not rule out co-infections with other pathogens, and should not be used as the sole basis for treatment or other patient management decisions. Negative results must be combined with clinical observations, patient history, and epidemiological information. The expected result is Negative.  Fact Sheet for Patients: HairSlick.no  Fact Sheet for Healthcare Providers: quierodirigir.com  This test is not yet approved or cleared by the [place] FDA and  has been authorized for detection and/or diagnosis of SARS-CoV-2 by FDA under an Emergency Use Authorization (EUA). This EUA will remain  in effect (meaning this test can be used) for the duration of the COVID-19 declaration under Se ction 564(b)(1) of the Act, 21 U.S.C. section 360bbb-3(b)(1), unless the authorization is terminated or revoked sooner.  Performed at [facility], [address], [place], [place] [address]    No results found.   Review of Systems  Constitutional: Negative.   HENT: Negative.   Eyes: Negative.   Respiratory: Negative.   Cardiovascular: Negative.   Gastrointestinal: Negative.   Genitourinary: Negative.   Musculoskeletal: Positive for  joint pain.  Skin: Negative.   Neurological: Negative.   Endo/Heme/Allergies: Negative.   Psychiatric/Behavioral: Negative.        Physical Exam Constitutional:      Appearance: He is well-developed.  HENT:     Head: Normocephalic.  Eyes:     Pupils: Pupils are equal, round, and reactive to light.  Neck:     Thyroid: No thyromegaly.     Vascular: No JVD.     Trachea: No tracheal deviation.  Cardiovascular:     Rate and Rhythm: Normal rate and regular rhythm.  Pulmonary:     Effort: Pulmonary effort is normal. No respiratory distress.     Breath sounds: Normal breath sounds. No wheezing.  Abdominal:     Palpations: Abdomen is soft.     Tenderness: There is no abdominal  tenderness. There is no guarding.  Musculoskeletal:     Cervical back: Neck supple.     Right knee: Swelling and effusion present. Decreased range of motion. Tenderness present.  Lymphadenopathy:     Cervical: No cervical adenopathy.  Skin:    General: Skin is warm and dry.  Neurological:     Mental Status: He is alert and oriented to person, place, and time.        Assessment/Plan Assessment:  Infected right UKR  Plan: Patient will undergo a  I&D with poly exchange of the right UKR on 08/15/2020 per Dr. Charlann Boxer at [facility]. Risks benefits and expectations were discussed with the patient. Patient understand risks, benefits and expectations and wishes to proceed.   Anastasio Auerbach Jesus Bothun   PA-C  08/14/2020, 10:16 PM

## 2020-08-14 NOTE — Progress Notes (Signed)
COVID Vaccine Completed: No Date COVID Vaccine completed: N/A COVID vaccine manufacturer:  N/A  PCP - Dr. Nadene Rubins last office visit 10/05/2019 in epic Cardiologist - N/A  Chest x-ray - N/A EKG - N/A Stress Test - greater than 2 years ECHO - N/A Cardiac Cath - N/A Pacemaker/ICD device last checked:N/A  Sleep Study - N/A CPAP - N/A  Fasting Blood Sugar - N/A Checks Blood Sugar ___N/A__ times a day  Blood Thinner Instructions: N/A Aspirin Instructions:N/A Last Dose:N/A  Anesthesia review: N/A  Patient denies shortness of breath, fever, cough and chest pain at PAT appointment   Patient verbalized understanding of instructions that were given to them at the PAT appointment. Patient was also instructed that they will need to review over the PAT instructions again at home before surgery.

## 2020-08-15 ENCOUNTER — Observation Stay: Payer: Self-pay

## 2020-08-15 ENCOUNTER — Encounter (HOSPITAL_COMMUNITY): Payer: Self-pay | Admitting: Orthopedic Surgery

## 2020-08-15 ENCOUNTER — Other Ambulatory Visit: Payer: Self-pay

## 2020-08-15 ENCOUNTER — Encounter (HOSPITAL_COMMUNITY)
Admission: AD | Disposition: A | Discharge status: Home / Self Care | Payer: Self-pay | Source: Home / Self Care | Attending: Orthopedic Surgery

## 2020-08-15 ENCOUNTER — Observation Stay (HOSPITAL_COMMUNITY)
Admission: AD | Admit: 2020-08-15 | Discharge: 2020-08-17 | Disposition: A | Discharge status: Home / Self Care | Payer: 59 | Attending: Orthopedic Surgery | Admitting: Orthopedic Surgery

## 2020-08-15 ENCOUNTER — Ambulatory Visit (HOSPITAL_COMMUNITY): Payer: 59 | Admitting: Certified Registered"

## 2020-08-15 ENCOUNTER — Ambulatory Visit: Payer: Self-pay

## 2020-08-15 DIAGNOSIS — Z96651 Presence of right artificial knee joint: Secondary | ICD-10-CM | POA: Insufficient documentation

## 2020-08-15 DIAGNOSIS — E119 Type 2 diabetes mellitus without complications: Secondary | ICD-10-CM | POA: Insufficient documentation

## 2020-08-15 DIAGNOSIS — T8453XA Infection and inflammatory reaction due to internal right knee prosthesis, initial encounter: Secondary | ICD-10-CM | POA: Diagnosis not present

## 2020-08-15 DIAGNOSIS — Y838 Other surgical procedures as the cause of abnormal reaction of the patient, or of later complication, without mention of misadventure at the time of the procedure: Secondary | ICD-10-CM | POA: Insufficient documentation

## 2020-08-15 DIAGNOSIS — Z7984 Long term (current) use of oral hypoglycemic drugs: Secondary | ICD-10-CM | POA: Insufficient documentation

## 2020-08-15 HISTORY — PX: I & D KNEE WITH POLY EXCHANGE: SHX5024

## 2020-08-15 LAB — CBC
HCT: 44 % (ref 39.0–52.0)
Hemoglobin: 14.6 g/dL (ref 13.0–17.0)
MCH: 30.2 pg (ref 26.0–34.0)
MCHC: 33.2 g/dL (ref 30.0–36.0)
MCV: 90.9 fL (ref 80.0–100.0)
Platelets: 215 10*3/uL (ref 150–400)
RBC: 4.84 MIL/uL (ref 4.22–5.81)
RDW: 14.1 % (ref 11.5–15.5)
WBC: 9.6 10*3/uL (ref 4.0–10.5)
nRBC: 0 % (ref 0.0–0.2)

## 2020-08-15 LAB — SURGICAL PCR SCREEN
MRSA, PCR: NEGATIVE
Staphylococcus aureus: POSITIVE — AB

## 2020-08-15 LAB — BASIC METABOLIC PANEL
Anion gap: 13 (ref 5–15)
BUN: 24 mg/dL — ABNORMAL HIGH (ref 8–23)
CO2: 26 mmol/L (ref 22–32)
Calcium: 9.1 mg/dL (ref 8.9–10.3)
Chloride: 102 mmol/L (ref 98–111)
Creatinine, Ser: 1.12 mg/dL (ref 0.61–1.24)
GFR calc non Af Amer: >60 mL/min (ref >60)
Glucose, Bld: 130 mg/dL — ABNORMAL HIGH (ref 70–99)
Potassium: 4.2 mmol/L (ref 3.5–5.1)
Sodium: 141 mmol/L (ref 135–145)

## 2020-08-15 LAB — TYPE AND SCREEN
ABO/RH(D): O POS
Antibody Screen: NEGATIVE

## 2020-08-15 LAB — GLUCOSE, CAPILLARY
Glucose-Capillary: 131 mg/dL — ABNORMAL HIGH (ref 70–99)
Glucose-Capillary: 127 mg/dL — ABNORMAL HIGH (ref 70–99)
Glucose-Capillary: 141 mg/dL — ABNORMAL HIGH (ref 70–99)
Glucose-Capillary: 183 mg/dL — ABNORMAL HIGH (ref 70–99)

## 2020-08-15 LAB — HEMOGLOBIN A1C
Hgb A1c MFr Bld: 6.3 % — ABNORMAL HIGH (ref 4.8–5.6)
Mean Plasma Glucose: 134.11 mg/dL

## 2020-08-15 SURGERY — IRRIGATION AND DEBRIDEMENT KNEE WITH POLY EXCHANGE
Anesthesia: Spinal | Site: Knee | Laterality: Right

## 2020-08-15 MED ORDER — HYDROXYZINE HCL 10 MG PO TABS
10.0000 mg | ORAL_TABLET | Freq: Three times a day (TID) | ORAL | Status: DC | PRN
  Filled 2020-08-15: qty 1

## 2020-08-15 MED ORDER — IRRISEPT - 450ML BOTTLE WITH 0.05% CHG IN STERILE WATER, USP 99.95% OPTIME
TOPICAL | Status: DC | PRN
  Administered 2020-08-15: 250 mL

## 2020-08-15 MED ORDER — SODIUM CHLORIDE 0.9% FLUSH
10.0000 mL | Freq: Two times a day (BID) | INTRAVENOUS | Status: DC
  Administered 2020-08-15 – 2020-08-16 (×2): 10 mL

## 2020-08-15 MED ORDER — PROPOFOL 1000 MG/100ML IV EMUL
INTRAVENOUS | Status: AC
  Filled 2020-08-15: qty 100

## 2020-08-15 MED ORDER — KETOROLAC TROMETHAMINE 30 MG/ML IJ SOLN
INTRAMUSCULAR | Status: AC
  Filled 2020-08-15: qty 1

## 2020-08-15 MED ORDER — OXYCODONE HCL 5 MG/5ML PO SOLN: 5.0000 mg | Freq: Once | ORAL | Status: DC | PRN

## 2020-08-15 MED ORDER — DEXAMETHASONE SODIUM PHOSPHATE 10 MG/ML IJ SOLN
10.0000 mg | Freq: Once | INTRAMUSCULAR | Status: AC
  Administered 2020-08-15: 10 mg via INTRAVENOUS

## 2020-08-15 MED ORDER — EPHEDRINE SULFATE-NACL 50-0.9 MG/10ML-% IV SOSY
PREFILLED_SYRINGE | INTRAVENOUS | Status: DC | PRN
  Administered 2020-08-15 (×3): 5 mg via INTRAVENOUS

## 2020-08-15 MED ORDER — PHENYLEPHRINE HCL (PRESSORS) 10 MG/ML IV SOLN
INTRAVENOUS | Status: AC
  Filled 2020-08-15: qty 1

## 2020-08-15 MED ORDER — ACETAMINOPHEN 10 MG/ML IV SOLN: 1000.0000 mg | Freq: Once | INTRAVENOUS | Status: DC | PRN

## 2020-08-15 MED ORDER — BISACODYL 10 MG RE SUPP: 10.0000 mg | Freq: Every day | RECTAL | Status: DC | PRN

## 2020-08-15 MED ORDER — LACTATED RINGERS IV SOLN: INTRAVENOUS | Status: DC

## 2020-08-15 MED ORDER — HYDROMORPHONE HCL 1 MG/ML IJ SOLN
0.5000 mg | INTRAMUSCULAR | Status: DC | PRN
  Administered 2020-08-15: 1 mg via INTRAVENOUS
  Filled 2020-08-15: qty 1

## 2020-08-15 MED ORDER — MAGNESIUM CITRATE PO SOLN: 1.0000 | Freq: Once | ORAL | Status: DC | PRN

## 2020-08-15 MED ORDER — VANCOMYCIN HCL 1000 MG IV SOLR
INTRAVENOUS | Status: AC
  Filled 2020-08-15: qty 1000

## 2020-08-15 MED ORDER — CHLORHEXIDINE GLUCONATE CLOTH 2 % EX PADS
6.0000 | MEDICATED_PAD | Freq: Every day | CUTANEOUS | Status: DC
  Administered 2020-08-16 (×2): 6 via TOPICAL

## 2020-08-15 MED ORDER — TRANEXAMIC ACID-NACL 1000-0.7 MG/100ML-% IV SOLN
1000.0000 mg | Freq: Once | INTRAVENOUS | Status: AC
  Administered 2020-08-15: 1000 mg via INTRAVENOUS
  Filled 2020-08-15: qty 100

## 2020-08-15 MED ORDER — FERROUS SULFATE 325 (65 FE) MG PO TABS
325.0000 mg | ORAL_TABLET | Freq: Two times a day (BID) | ORAL | Status: DC
  Administered 2020-08-16 – 2020-08-17 (×4): 325 mg via ORAL
  Filled 2020-08-15 (×4): qty 1

## 2020-08-15 MED ORDER — BUPIVACAINE-EPINEPHRINE (PF) 0.25% -1:200000 IJ SOLN: INTRAMUSCULAR | Status: DC | PRN

## 2020-08-15 MED ORDER — FENTANYL CITRATE (PF) 100 MCG/2ML IJ SOLN
INTRAMUSCULAR | Status: AC
Start: 2020-08-15 — End: ?
  Filled 2020-08-15: qty 2

## 2020-08-15 MED ORDER — PHENOL 1.4 % MT LIQD: 1.0000 | OROMUCOSAL | Status: DC | PRN

## 2020-08-15 MED ORDER — ALUM & MAG HYDROXIDE-SIMETH 200-200-20 MG/5ML PO SUSP
15.0000 mL | ORAL | Status: DC | PRN
  Administered 2020-08-16: 15 mL via ORAL
  Filled 2020-08-15: qty 30

## 2020-08-15 MED ORDER — BUPIVACAINE-EPINEPHRINE (PF) 0.25% -1:200000 IJ SOLN
INTRAMUSCULAR | Status: AC
  Filled 2020-08-15: qty 30

## 2020-08-15 MED ORDER — BUPIVACAINE IN DEXTROSE 0.75-8.25 % IT SOLN
INTRATHECAL | Status: DC | PRN
  Administered 2020-08-15: 1.4 mL via INTRATHECAL

## 2020-08-15 MED ORDER — METOCLOPRAMIDE HCL 5 MG PO TABS: 5.0000 mg | ORAL_TABLET | Freq: Three times a day (TID) | ORAL | Status: DC | PRN

## 2020-08-15 MED ORDER — ORAL CARE MOUTH RINSE: 15.0000 mL | Freq: Once | OROMUCOSAL | Status: AC

## 2020-08-15 MED ORDER — MENTHOL 3 MG MT LOZG: 1.0000 | LOZENGE | OROMUCOSAL | Status: DC | PRN

## 2020-08-15 MED ORDER — VANCOMYCIN HCL 2000 MG/400ML IV SOLN
2000.0000 mg | Freq: Once | INTRAVENOUS | Status: AC
  Administered 2020-08-15: 2000 mg via INTRAVENOUS
  Filled 2020-08-15: qty 400

## 2020-08-15 MED ORDER — INSULIN ASPART 100 UNIT/ML ~~LOC~~ SOLN
0.0000 [IU] | Freq: Three times a day (TID) | SUBCUTANEOUS | Status: DC
  Administered 2020-08-15: 2 [IU] via SUBCUTANEOUS
  Administered 2020-08-16: 3 [IU] via SUBCUTANEOUS
  Administered 2020-08-16: 8 [IU] via SUBCUTANEOUS
  Administered 2020-08-16 – 2020-08-17 (×2): 3 [IU] via SUBCUTANEOUS
  Administered 2020-08-17 (×2): 2 [IU] via SUBCUTANEOUS

## 2020-08-15 MED ORDER — SODIUM CHLORIDE 0.9% FLUSH: 10.0000 mL | INTRAVENOUS | Status: DC | PRN

## 2020-08-15 MED ORDER — CHLORHEXIDINE GLUCONATE 0.12 % MT SOLN
15.0000 mL | Freq: Once | OROMUCOSAL | Status: AC
  Administered 2020-08-15: 15 mL via OROMUCOSAL

## 2020-08-15 MED ORDER — SODIUM CHLORIDE (PF) 0.9 % IJ SOLN
INTRAMUSCULAR | Status: AC
  Filled 2020-08-15: qty 50

## 2020-08-15 MED ORDER — DOCUSATE SODIUM 100 MG PO CAPS
100.0000 mg | ORAL_CAPSULE | Freq: Two times a day (BID) | ORAL | Status: DC
  Administered 2020-08-15 – 2020-08-17 (×4): 100 mg via ORAL
  Filled 2020-08-15 (×4): qty 1

## 2020-08-15 MED ORDER — ACETAMINOPHEN 325 MG PO TABS: 325.0000 mg | ORAL_TABLET | Freq: Four times a day (QID) | ORAL | Status: DC | PRN

## 2020-08-15 MED ORDER — KETOROLAC TROMETHAMINE 30 MG/ML IJ SOLN: INTRAMUSCULAR | Status: DC | PRN

## 2020-08-15 MED ORDER — OXYCODONE HCL 5 MG PO TABS: 5.0000 mg | ORAL_TABLET | Freq: Once | ORAL | Status: DC | PRN

## 2020-08-15 MED ORDER — SODIUM CHLORIDE 0.9 % IV SOLN: INTRAVENOUS | Status: DC

## 2020-08-15 MED ORDER — CEFAZOLIN SODIUM-DEXTROSE 2-4 GM/100ML-% IV SOLN
2.0000 g | INTRAVENOUS | Status: AC
  Administered 2020-08-15: 2 g via INTRAVENOUS
  Filled 2020-08-15: qty 100

## 2020-08-15 MED ORDER — METHOCARBAMOL 500 MG IVPB - SIMPLE MED
500.0000 mg | Freq: Four times a day (QID) | INTRAVENOUS | Status: DC | PRN
  Filled 2020-08-15: qty 50

## 2020-08-15 MED ORDER — BUPIVACAINE HCL (PF) 0.5 % IJ SOLN
INTRAMUSCULAR | Status: DC | PRN
  Administered 2020-08-15: 20 mL via PERINEURAL

## 2020-08-15 MED ORDER — ASPIRIN 81 MG PO CHEW
81.0000 mg | CHEWABLE_TABLET | Freq: Two times a day (BID) | ORAL | Status: DC
  Administered 2020-08-15 – 2020-08-17 (×4): 81 mg via ORAL
  Filled 2020-08-15 (×4): qty 1

## 2020-08-15 MED ORDER — CELECOXIB 200 MG PO CAPS
200.0000 mg | ORAL_CAPSULE | Freq: Two times a day (BID) | ORAL | Status: DC
  Administered 2020-08-15 – 2020-08-17 (×4): 200 mg via ORAL
  Filled 2020-08-15 (×4): qty 1

## 2020-08-15 MED ORDER — ONDANSETRON HCL 4 MG/2ML IJ SOLN
INTRAMUSCULAR | Status: DC | PRN
  Administered 2020-08-15: 4 mg via INTRAVENOUS

## 2020-08-15 MED ORDER — VANCOMYCIN HCL 1000 MG IV SOLR
INTRAVENOUS | Status: DC | PRN
  Administered 2020-08-15: 2000 mg

## 2020-08-15 MED ORDER — 0.9 % SODIUM CHLORIDE (POUR BTL) OPTIME
TOPICAL | Status: DC | PRN
  Administered 2020-08-15: 1000 mL

## 2020-08-15 MED ORDER — VANCOMYCIN HCL 750 MG/150ML IV SOLN
750.0000 mg | Freq: Two times a day (BID) | INTRAVENOUS | Status: DC
  Administered 2020-08-16: 750 mg via INTRAVENOUS
  Filled 2020-08-15 (×2): qty 150

## 2020-08-15 MED ORDER — HYDROMORPHONE HCL 1 MG/ML IJ SOLN: 0.2500 mg | INTRAMUSCULAR | Status: DC | PRN

## 2020-08-15 MED ORDER — EPHEDRINE 5 MG/ML INJ
INTRAVENOUS | Status: AC
  Filled 2020-08-15: qty 10

## 2020-08-15 MED ORDER — METOCLOPRAMIDE HCL 5 MG/ML IJ SOLN: 5.0000 mg | Freq: Three times a day (TID) | INTRAMUSCULAR | Status: DC | PRN

## 2020-08-15 MED ORDER — METFORMIN HCL ER 500 MG PO TB24
500.0000 mg | ORAL_TABLET | Freq: Every day | ORAL | Status: DC
  Administered 2020-08-15 – 2020-08-17 (×3): 500 mg via ORAL
  Filled 2020-08-15 (×3): qty 1

## 2020-08-15 MED ORDER — SODIUM CHLORIDE (PF) 0.9 % IJ SOLN: INTRAMUSCULAR | Status: DC | PRN

## 2020-08-15 MED ORDER — FENTANYL CITRATE (PF) 100 MCG/2ML IJ SOLN
50.0000 ug | INTRAMUSCULAR | Status: AC
  Administered 2020-08-15: 100 ug via INTRAVENOUS
  Filled 2020-08-15: qty 2

## 2020-08-15 MED ORDER — HYDROCODONE-ACETAMINOPHEN 7.5-325 MG PO TABS
1.0000 | ORAL_TABLET | ORAL | Status: DC | PRN
  Administered 2020-08-15 – 2020-08-16 (×5): 2 via ORAL
  Administered 2020-08-17: 1 via ORAL
  Administered 2020-08-17: 2 via ORAL
  Administered 2020-08-17: 1 via ORAL
  Filled 2020-08-15 (×2): qty 2
  Filled 2020-08-15: qty 1
  Filled 2020-08-15 (×2): qty 2
  Filled 2020-08-15: qty 1
  Filled 2020-08-15 (×2): qty 2

## 2020-08-15 MED ORDER — TRANEXAMIC ACID-NACL 1000-0.7 MG/100ML-% IV SOLN
1000.0000 mg | INTRAVENOUS | Status: AC
  Administered 2020-08-15: 1000 mg via INTRAVENOUS
  Filled 2020-08-15: qty 100

## 2020-08-15 MED ORDER — CEFAZOLIN SODIUM-DEXTROSE 2-4 GM/100ML-% IV SOLN
2.0000 g | Freq: Four times a day (QID) | INTRAVENOUS | Status: AC
  Administered 2020-08-15 (×2): 2 g via INTRAVENOUS
  Filled 2020-08-15 (×2): qty 100

## 2020-08-15 MED ORDER — ONDANSETRON HCL 4 MG/2ML IJ SOLN: 4.0000 mg | Freq: Four times a day (QID) | INTRAMUSCULAR | Status: DC | PRN

## 2020-08-15 MED ORDER — ONDANSETRON HCL 4 MG PO TABS: 4.0000 mg | ORAL_TABLET | Freq: Four times a day (QID) | ORAL | Status: DC | PRN

## 2020-08-15 MED ORDER — POLYETHYLENE GLYCOL 3350 17 G PO PACK
17.0000 g | PACK | Freq: Two times a day (BID) | ORAL | Status: DC
  Administered 2020-08-15 – 2020-08-17 (×4): 17 g via ORAL
  Filled 2020-08-15 (×4): qty 1

## 2020-08-15 MED ORDER — PROPOFOL 500 MG/50ML IV EMUL
INTRAVENOUS | Status: DC | PRN
  Administered 2020-08-15: 75 ug/kg/min via INTRAVENOUS

## 2020-08-15 MED ORDER — MIDAZOLAM HCL 2 MG/2ML IJ SOLN
1.0000 mg | INTRAMUSCULAR | Status: AC
  Administered 2020-08-15: 2 mg via INTRAVENOUS
  Filled 2020-08-15: qty 2

## 2020-08-15 MED ORDER — DEXAMETHASONE SODIUM PHOSPHATE 10 MG/ML IJ SOLN
10.0000 mg | Freq: Once | INTRAMUSCULAR | Status: AC
  Administered 2020-08-16: 10 mg via INTRAVENOUS
  Filled 2020-08-15: qty 1

## 2020-08-15 MED ORDER — METHOCARBAMOL 500 MG PO TABS
500.0000 mg | ORAL_TABLET | Freq: Four times a day (QID) | ORAL | Status: DC | PRN
  Administered 2020-08-15 – 2020-08-17 (×4): 500 mg via ORAL
  Filled 2020-08-15 (×4): qty 1

## 2020-08-15 MED ORDER — HYDROCODONE-ACETAMINOPHEN 5-325 MG PO TABS
1.0000 | ORAL_TABLET | ORAL | Status: DC | PRN
  Administered 2020-08-17: 1 via ORAL
  Filled 2020-08-15: qty 1

## 2020-08-15 MED ORDER — SODIUM CHLORIDE 0.9 % IR SOLN
Status: DC | PRN
Start: 2020-08-15 — End: 2020-08-15
  Administered 2020-08-15: 6000 mL

## 2020-08-15 MED ORDER — MIDAZOLAM HCL 2 MG/2ML IJ SOLN
INTRAMUSCULAR | Status: AC
  Filled 2020-08-15: qty 2

## 2020-08-15 MED ORDER — POVIDONE-IODINE 10 % EX SWAB
2.0000 "application " | Freq: Once | CUTANEOUS | Status: AC
  Administered 2020-08-15: 2 via TOPICAL

## 2020-08-15 SURGICAL SUPPLY — 48 items
BAG ZIPLOCK 12X15 (MISCELLANEOUS) ×3 IMPLANT
BEARING ANATOMIC 5 (Orthopedic Implant) ×2 IMPLANT
BEARING ANATOMIC 5MM (Orthopedic Implant) ×1 IMPLANT
BNDG ELASTIC 4X5.8 VLCR STR LF (GAUZE/BANDAGES/DRESSINGS) IMPLANT
BNDG ELASTIC 6X10 VLCR STRL LF (GAUZE/BANDAGES/DRESSINGS) ×3 IMPLANT
BNDG ELASTIC 6X5.8 VLCR STR LF (GAUZE/BANDAGES/DRESSINGS) ×3 IMPLANT
COVER SURGICAL LIGHT HANDLE (MISCELLANEOUS) ×3 IMPLANT
COVER WAND RF STERILE (DRAPES) IMPLANT
CUFF TOURN SGL QUICK 34 (TOURNIQUET CUFF) ×3
CUFF TRNQT CYL 34X4.125X (TOURNIQUET CUFF) ×1 IMPLANT
DECANTER SPIKE VIAL GLASS SM (MISCELLANEOUS) ×3 IMPLANT
DERMABOND ADVANCED (GAUZE/BANDAGES/DRESSINGS) ×2
DERMABOND ADVANCED .7 DNX12 (GAUZE/BANDAGES/DRESSINGS) ×1 IMPLANT
DRAPE U-SHAPE 47X51 STRL (DRAPES) ×3 IMPLANT
DRSG AQUACEL AG ADV 3.5X10 (GAUZE/BANDAGES/DRESSINGS) ×3 IMPLANT
DRSG AQUACEL AG ADV 3.5X14 (GAUZE/BANDAGES/DRESSINGS) IMPLANT
DURAPREP 26ML APPLICATOR (WOUND CARE) ×6 IMPLANT
ELECT REM PT RETURN 15FT ADLT (MISCELLANEOUS) ×3 IMPLANT
GLOVE BIOGEL PI IND STRL 7.5 (GLOVE) ×1 IMPLANT
GLOVE BIOGEL PI IND STRL 8.5 (GLOVE) ×1 IMPLANT
GLOVE BIOGEL PI INDICATOR 7.5 (GLOVE) ×2
GLOVE BIOGEL PI INDICATOR 8.5 (GLOVE) ×2
GLOVE ECLIPSE 8.0 STRL XLNG CF (GLOVE) ×3 IMPLANT
GLOVE ORTHO TXT STRL SZ7.5 (GLOVE) ×3 IMPLANT
GOWN STRL REUS W/TWL LRG LVL3 (GOWN DISPOSABLE) ×3 IMPLANT
GOWN STRL REUS W/TWL XL LVL3 (GOWN DISPOSABLE) ×3 IMPLANT
HANDPIECE INTERPULSE COAX TIP (DISPOSABLE) ×3
JET LAVAGE IRRISEPT WOUND (IRRIGATION / IRRIGATOR) ×3
KIT TURNOVER KIT A (KITS) IMPLANT
LAVAGE JET IRRISEPT WOUND (IRRIGATION / IRRIGATOR) ×1 IMPLANT
MANIFOLD NEPTUNE II (INSTRUMENTS) ×3 IMPLANT
NS IRRIG 1000ML POUR BTL (IV SOLUTION) ×3 IMPLANT
PACK TOTAL KNEE CUSTOM (KITS) ×3 IMPLANT
PENCIL SMOKE EVACUATOR (MISCELLANEOUS) IMPLANT
PROTECTOR NERVE ULNAR (MISCELLANEOUS) ×3 IMPLANT
SET HNDPC FAN SPRY TIP SCT (DISPOSABLE) ×1 IMPLANT
SET PAD KNEE POSITIONER (MISCELLANEOUS) ×3 IMPLANT
STAPLER VISISTAT 35W (STAPLE) IMPLANT
SUT MNCRL AB 3-0 PS2 18 (SUTURE) IMPLANT
SUT STRATAFIX 0 PDS 27 VIOLET (SUTURE) ×3
SUT VIC AB 1 CT1 36 (SUTURE) ×3 IMPLANT
SUT VIC AB 2-0 CT1 27 (SUTURE) ×9
SUT VIC AB 2-0 CT1 TAPERPNT 27 (SUTURE) ×3 IMPLANT
SUTURE STRATFX 0 PDS 27 VIOLET (SUTURE) ×1 IMPLANT
SWAB COLLECTION DEVICE MRSA (MISCELLANEOUS) IMPLANT
SWAB CULTURE ESWAB REG 1ML (MISCELLANEOUS) IMPLANT
TRAY FOLEY MTR SLVR 16FR STAT (SET/KITS/TRAYS/PACK) ×3 IMPLANT
WRAP KNEE MAXI GEL POST OP (GAUZE/BANDAGES/DRESSINGS) ×3 IMPLANT

## 2020-08-15 NOTE — Op Note (Signed)
NAME: Jesus Nunez, Jesus Nunez MEDICAL RECORD [id number] ACCOUNT [id number] DATE OF BIRTH:May 09, 1956 FACILITY: [facility] LOCATION: [id number] PHYSICIAN:Rae Plotner Rosalia Hammers, MD  OPERATIVE REPORT  DATE OF PROCEDURE:  08/15/2020  PREOPERATIVE DIAGNOSIS:  Infected right knee following partial knee arthroplasty.  POSTOPERATIVE DIAGNOSIS:  Infected right knee following partial knee arthroplasty.  PROCEDURE: 1.  Open excisional and non-excisional debridement of right knee. A.  Excisional debridement consisted of a 6-8 inch incision including skin, subcutaneous tissue, nonviable tissue.  This was done sharply with a knife and scalpel. B.  The nonexcisional debridement consisted of irrigated with 3 L normal saline solution x2 with a 500 mL bottle of IrriSept, chlorhexidine-based fluid as an irrigant and pooling. 2.  Revision polyethylene replacing the old polyethylene with a 1 size up to a size 5 insert to match the right knee medium size femur.  SURGEON:  [name], MD  ASSISTANT:  Dennie Bible, PA-C.  Note that Ms. Adrian Blackwater was present for the entirety of the case from preoperative positioning, perioperative management of the operative extremity, general facilitation of the case and primary wound closure.  ANESTHESIA:  Regional plus spinal.  SPECIMENS:  We did take a syringe of fluid to send to pathology for Gram stain analysis, aerobic and anaerobic.  DRAINS:  None.  TOURNIQUET:  Up for about 45 minutes at 250 mmHg.  INDICATIONS:  The patient is a 64 year old male who underwent a right partial knee arthroplasty about a year ago.  Though he had initial postoperative aches and stiffness, there was no wound issues or constitutional symptoms.  He reported to the office  this past Friday with a short onset increased pain and low-grade fever.  He had been doing exceptionally well over the past 3 months prior to the onset of this symptom.  He did have recent dental work performed in the past month, but did  report using  antibiotics.  In the office, his knee was aspirated, revealing purulence.  Fluid was sent for evaluation and surgery discussed in detail.  Based on his presentation and his history, it really seems to be an acute event.  Given the fact that he had a  period of time where he was doing very well and returned to normal activities and then the onset, gives me the clinically indication that this has acutely occurred.  For that reason, I discussed with him about joint salvage and preservation of his  partial knee versus resection of the entire knee and placement of antibiotic spacer.  We had a lengthy discussion regarding the pros and cons, the risks and benefits of each approach.  At this point, we will proceed with a joint salvaging technique with  I and D, poly exchange, IV antibiotics and 3-6 months of oral antibiotics.  They are understanding that the consequence of this is that it may not work and failure would lead to resection of his native knee, placement of an antibiotic spacer.  Questions  were encouraged, answered regarding that and consent was obtained.  DESCRIPTION OF PROCEDURE:  The patient was brought to the operative theater.  Once adequate anesthesia, preoperative antibiotics given, he was positioned supine with a right thigh tourniquet placed.  The right lower extremity was then prepped and draped  in sterile fashion.  Timeout was performed identifying the patient, the planned procedure and extremity.  The leg was exsanguinated, tourniquet elevated to 250 mmHg.  His old incision was excised and extended proximally and slightly distal.  Soft tissue  planes created.  Then a  median arthrotomy was made encountering purulent fluid from the knee.  Cultures were obtained at this point.  Following initial exposure including synovectomy and scar debridement medially and suprapatellar, as well as around the  patellar region,  we irrigated the knee wound with 3 L of normal saline solution.   The old polyethylene insert was removed.  Following the first bag fluid, I placed Irrisept fluid into the knee and let it sit for the recommended 5 minutes.  We then  performed a trial reduction and decided to change to a size 5 insert versus a size 4.  Prior to opening the new insert, we rewashed the knee out with normal saline.  I then sprinkled vancomycin powder into the superficial and deep aspect of the knee and  some posteriorly.  We then placed the size 5 insert to match the right knee on the medium femur and it was snapped into place.  At this point, the extensor mechanism was reapproximated using  #1 Vicryl and #1 Stratafix suture.  The remainder of the wound  was closed with 2-0 Vicryl and a running Monocryl stitch.  The knee was cleaned, dried and dressed sterilely with surgical glue and Aquacel dressing.  The knee was wrapped in Ace wrap.  He was then brought to the recovery room in stable condition,  tolerating the procedure well.  VN/NUANCE  D:08/15/2020 T:08/15/2020 JOB:[id number]

## 2020-08-15 NOTE — Anesthesia Postprocedure Evaluation (Signed)
Anesthesia Post Note  Patient: Jesus Nunez.  Procedure(Nunez) Performed: RIGHT UNI COMPARTMENTAL KNEE IRRIGATION AND DEBRIDEMENT KNEE WITH POLY EXCHANGE (Right Knee)     Patient location during evaluation: PACU Anesthesia Type: Spinal Level of consciousness: oriented and awake and alert Pain management: pain level controlled Vital Signs Assessment: post-procedure vital signs reviewed and stable Respiratory status: spontaneous breathing, respiratory function stable and patient connected to nasal cannula oxygen Cardiovascular status: blood pressure returned to baseline and stable Postop Assessment: no headache, no backache and no apparent nausea or vomiting Anesthetic complications: no   No complications documented.  Last Vitals:  Vitals:   08/15/20 1513 08/15/20 1611  BP: 139/83 136/80  Pulse: 69 70  Resp: 18 18  Temp: 36.6 C   SpO2: 100% 97%    Last Pain:  Vitals:   08/15/20 1513  TempSrc: Oral  PainSc:                  Jesus Nunez

## 2020-08-15 NOTE — Discharge Instructions (Signed)

## 2020-08-15 NOTE — Transfer of Care (Signed)
Immediate Anesthesia Transfer of Care Note  Patient: Jesus La Sr.  Procedure(s) Performed: RIGHT UNI COMPARTMENTAL KNEE IRRIGATION AND DEBRIDEMENT KNEE WITH POLY EXCHANGE (Right Knee)  Patient Location: PACU  Anesthesia Type:Spinal  Level of Consciousness: awake, alert  and patient cooperative  Airway & Oxygen Therapy: Patient Spontanous Breathing and Patient connected to face mask oxygen  Post-op Assessment: Report given to RN and Post -op Vital signs reviewed and stable  Post vital signs: Reviewed and stable  Last Vitals:  Vitals Value Taken Time  BP    Temp    Pulse 71 08/15/20 1350  Resp 13 08/15/20 1350  SpO2 99 % 08/15/20 1350  Vitals shown include unvalidated device data.  Last Pain:  Vitals:   08/15/20 1029  TempSrc: Oral  PainSc:       Patients Stated Pain Goal: 0 (08/15/20 1022)  Complications: No complications documented.

## 2020-08-15 NOTE — Anesthesia Procedure Notes (Signed)
Anesthesia Regional Block: Adductor canal block   Pre-Anesthetic Checklist: ,, timeout performed, Correct Patient, Correct Site, Correct Laterality, Correct Procedure, Correct Position, site marked, Risks and benefits discussed,  Surgical consent,  Pre-op evaluation,  At surgeon's request and post-op pain management  Laterality: Right  Prep: chloraprep       Needles:  Injection technique: Single-shot  Needle Type: Echogenic Needle     Needle Length: 9cm      Additional Needles:   Procedures:,,,, ultrasound used (permanent image in chart),,,,  Narrative:  Start time: 08/15/2020 11:22 AM End time: 08/15/2020 11:28 AM Injection made incrementally with aspirations every 5 mL.  Performed by: Personally  Anesthesiologist: Eilene Ghazi, MD  Additional Notes: Patient tolerated the procedure well without complications

## 2020-08-15 NOTE — Anesthesia Procedure Notes (Signed)
Spinal  Patient location during procedure: OR Start time: 08/15/2020 12:10 PM Staffing Performed: resident/CRNA  Anesthesiologist: Myrtie Soman, MD Resident/CRNA: Eben Burow, CRNA Preanesthetic Checklist Completed: patient identified, IV checked, site marked, risks and benefits discussed, surgical consent, monitors and equipment checked, pre-op evaluation and timeout performed Spinal Block Patient position: sitting Prep: DuraPrep and site prepped and draped Patient monitoring: heart rate, cardiac monitor, continuous pulse ox and blood pressure Approach: midline Location: L3-4 Injection technique: single-shot Needle Needle type: Pencan  Needle gauge: 24 G Needle length: 9 cm Assessment Sensory level: T4 Additional Notes Pt placed in sitting position, spinal kit expiration date checked and verified, + CSF, - heme, pt tolerated well.  Dr Kalman Shan present and supervising throughout [place].

## 2020-08-15 NOTE — Anesthesia Procedure Notes (Signed)
Procedure Name: MAC Date/Time: 08/15/2020 12:03 PM Performed by: Eben Burow, CRNA Pre-anesthesia Checklist: Patient identified, Emergency Drugs available, Suction available, Patient being monitored and Timeout performed Oxygen Delivery Method: Simple face mask Placement Confirmation: positive ETCO2

## 2020-08-15 NOTE — Progress Notes (Signed)
Assisted Dr. Rose with right, ultrasound guided, adductor canal block. Side rails up, monitors on throughout procedure. See vital signs in flow sheet. Tolerated Procedure well.  

## 2020-08-15 NOTE — Anesthesia Procedure Notes (Signed)
Anesthesia Procedure Image    

## 2020-08-15 NOTE — Progress Notes (Signed)
Peripherally Inserted Central Catheter Placement  The IV Nurse has discussed with the patient and/or persons authorized to consent for the patient, the purpose of this procedure and the potential benefits and risks involved with this procedure.  The benefits include less needle sticks, lab draws from the catheter, and the patient may be discharged home with the catheter. Risks include, but not limited to, infection, bleeding, blood clot (thrombus formation), and puncture of an artery; nerve damage and irregular heartbeat and possibility to perform a PICC exchange if needed/ordered by physician.  Alternatives to this procedure were also discussed.  Bard Power PICC patient education guide, fact sheet on infection prevention and patient information card has been provided to patient /or left at bedside.    PICC Placement Documentation  PICC Single Lumen 08/15/20 PICC Right Brachial 39 cm 0 cm (Active)  Indication for Insertion or Continuance of Line Home intravenous therapies (PICC only) 08/15/20 2033  Exposed Catheter (cm) 0 cm 08/15/20 2033  Site Assessment Clean;Dry;Intact 08/15/20 2033  Line Status Flushed;Saline locked;Blood return noted 08/15/20 2033  Dressing Type Transparent 08/15/20 2033  Dressing Status Clean;Dry;Intact 08/15/20 2033  Dressing Intervention New dressing 08/15/20 2033  Dressing Change Due 08/22/20 08/15/20 2033       [name] 08/15/2020, 8:33 PM

## 2020-08-15 NOTE — Interval H&P Note (Signed)
History and Physical Interval Note:  08/15/2020 9:53 AM  Jesus La Sr.  has presented today for surgery, with the diagnosis of Infected right unicompartmental knee arthroplasty.  The various methods of treatment have been discussed with the patient and family. After consideration of risks, benefits and other options for treatment, the patient has consented to  Procedure(s): RIGHT UNI COMPARTMENTAL KNEE IRRIGATION AND DEBRIDEMENT KNEE WITH POLY EXCHANGE (Right) as a surgical intervention.  The patient's history has been reviewed, patient examined, no change in status, stable for surgery.  I have reviewed the patient's chart and labs.  Questions were answered to the patient's satisfaction.     Shelda Pal

## 2020-08-15 NOTE — Brief Op Note (Signed)
08/15/2020  11:14 AM  PATIENT:  Jesus La Sr.  64 y.o. male  PRE-OPERATIVE DIAGNOSIS:  Acutely infected right unicompartmental knee arthroplasty  POST-OPERATIVE DIAGNOSIS:  Acutely infected right unicompartmental knee arthroplasty  PROCEDURE:  Procedure(s): RIGHT UNI COMPARTMENTAL KNEE IRRIGATION AND DEBRIDEMENT KNEE WITH POLY EXCHANGE (Right)  SURGEON:  Surgeon(s) and Role:    [name], MD - Primary  PHYSICIAN ASSISTANT: Dennie Bible, PA-C  ANESTHESIA:   regional and spinal  EBL: <50 cc  BLOOD ADMINISTERED:none  DRAINS: none   LOCAL MEDICATIONS USED:  OTHER Vancomycin powder  SPECIMEN:  Source of Specimen:  right knee synovial fluid  DISPOSITION OF SPECIMEN:  PATHOLOGY  COUNTS:  YES  TOURNIQUET:  45 min at 250 mmHg  DICTATION: .Other Dictation: Dictation Number [id number]  PLAN OF CARE: Admit to inpatient   PATIENT DISPOSITION:  PACU - hemodynamically stable.   Delay start of Pharmacological VTE agent (>24hrs) due to surgical blood loss or risk of bleeding: no

## 2020-08-15 NOTE — Anesthesia Preprocedure Evaluation (Signed)
Anesthesia Evaluation  Patient identified by MRN, date of birth, ID band Patient awake    Reviewed: Allergy & Precautions, NPO status , Patient's Chart, lab work & pertinent test results  Airway Mallampati: II  TM Distance: >3 FB Neck ROM: Full    Dental no notable dental hx.    Pulmonary neg pulmonary ROS,    Pulmonary exam normal breath sounds clear to auscultation       Cardiovascular negative cardio ROS Normal cardiovascular exam Rhythm:Regular Rate:Normal     Neuro/Psych negative neurological ROS  negative psych ROS   GI/Hepatic negative GI ROS, Neg liver ROS,   Endo/Other  diabetes, Type obesity  Renal/GU negative Renal ROS  negative genitourinary   Musculoskeletal  (+) Arthritis , Osteoarthritis,    Abdominal   Peds negative pediatric ROS (+)  Hematology negative hematology ROS (+)   Anesthesia Other Findings   Reproductive/Obstetrics negative OB ROS                             Anesthesia Physical Anesthesia Plan  ASA: II  Anesthesia Plan: Spinal   Post-op Pain Management:  Regional for Post-op pain   Induction: Intravenous  PONV Risk Score and Plan: 2 and Ondansetron, Dexamethasone and Treatment may vary due to age or medical condition  Airway Management Planned: Simple Face Mask  Additional Equipment:   Intra-op Plan:   Post-operative Plan:   Informed Consent: I have reviewed the patients History and Physical, chart, labs and discussed the procedure including the risks, benefits and alternatives for the proposed anesthesia with the patient or authorized representative who has indicated his/her understanding and acceptance.     Dental advisory given  Plan Discussed with: CRNA and Surgeon  Anesthesia Plan Comments:         Anesthesia Quick Evaluation

## 2020-08-15 NOTE — Progress Notes (Signed)
Pharmacy Antibiotic Note  Jesus Terrio Sr. is a 64 y.o. male admitted on 08/15/2020 with infection of right unicompartmental knee arthroplasty.  Pharmacy has been consulted for Vancomycin dosing.  S/P I&D with poly exchange on 10/5 Afebrile, WBC wnl, SCr 1.12  Plan: Vancomycin 2g IV x1 dose now, then 750 mg IV q12h. Follow up renal function, culture results, and clinical course.   Height: 5\' 8"  (172.7 cm) Weight: 114.6 kg (252 lb 9.6 oz) IBW/kg (Calculated) : 68.4  Temp (24hrs), Avg:98.1 F (36.7 C), Min:97.6 F (36.4 C), Max:98.8 F (37.1 C)  Recent Labs  Lab 08/15/20 1023  WBC 9.6  CREATININE 1.12    Estimated Creatinine Clearance: 83 mL/min (by C-G formula based on SCr of 1.12 mg/dL).    Allergies  Allergen Reactions  . Benadryl [Diphenhydramine] Hives and Itching    Antimicrobials this admission: 10/5 Cefazolin >> 10/6 10/5 Vancomycin >>   Dose adjustments this admission:   Microbiology results: 10/5 Synovial fluid, right knee:    Thank you for allowing pharmacy to be a part of this patient's care.  10/15/20 PharmD, BCPS Clinical Pharmacist [facility] main pharmacy [phone] 08/15/2020 3:25 PM

## 2020-08-16 ENCOUNTER — Encounter (HOSPITAL_COMMUNITY): Payer: Self-pay | Admitting: Orthopedic Surgery

## 2020-08-16 DIAGNOSIS — T8453XA Infection and inflammatory reaction due to internal right knee prosthesis, initial encounter: Principal | ICD-10-CM

## 2020-08-16 LAB — CBC
HCT: 41.8 % (ref 39.0–52.0)
Hemoglobin: 13.8 g/dL (ref 13.0–17.0)
MCH: 30.1 pg (ref 26.0–34.0)
MCHC: 33 g/dL (ref 30.0–36.0)
MCV: 91.1 fL (ref 80.0–100.0)
Platelets: 222 10*3/uL (ref 150–400)
RBC: 4.59 MIL/uL (ref 4.22–5.81)
RDW: 14 % (ref 11.5–15.5)
WBC: 10.5 10*3/uL (ref 4.0–10.5)
nRBC: 0 % (ref 0.0–0.2)

## 2020-08-16 LAB — BASIC METABOLIC PANEL
Anion gap: 7 (ref 5–15)
BUN: 25 mg/dL — ABNORMAL HIGH (ref 8–23)
CO2: 27 mmol/L (ref 22–32)
Calcium: 8.5 mg/dL — ABNORMAL LOW (ref 8.9–10.3)
Chloride: 104 mmol/L (ref 98–111)
Creatinine, Ser: 1.11 mg/dL (ref 0.61–1.24)
GFR calc non Af Amer: >60 mL/min (ref >60)
Glucose, Bld: 186 mg/dL — ABNORMAL HIGH (ref 70–99)
Potassium: 4.9 mmol/L (ref 3.5–5.1)
Sodium: 138 mmol/L (ref 135–145)

## 2020-08-16 LAB — GLUCOSE, CAPILLARY
Glucose-Capillary: 160 mg/dL — ABNORMAL HIGH (ref 70–99)
Glucose-Capillary: 198 mg/dL — ABNORMAL HIGH (ref 70–99)
Glucose-Capillary: 256 mg/dL — ABNORMAL HIGH (ref 70–99)
Glucose-Capillary: 160 mg/dL — ABNORMAL HIGH (ref 70–99)

## 2020-08-16 MED ORDER — CEFAZOLIN SODIUM-DEXTROSE 2-4 GM/100ML-% IV SOLN
2.0000 g | Freq: Three times a day (TID) | INTRAVENOUS | Status: DC
  Administered 2020-08-16 – 2020-08-17 (×4): 2 g via INTRAVENOUS
  Filled 2020-08-16 (×4): qty 100

## 2020-08-16 NOTE — Consult Note (Signed)
[facility]  Total days of antibiotics 2         Reason for Consult: prosthetic joint infection Of right knee  Referring Physician: olin  Principal Problem:   Infected right UKR    HPI: Jesus Kessinger Sr. is a 64 y.o. male with T2DM, OA, history of right medial UKR that was complicated by anterior knee erythema treated with a course of cephalexin in sept 2020. 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 He noticed that he had acute onset of swelling and pain of right knee on 9/30. He was seen the following day in dr olin's office where they aspirated 1mL of purulent fluid which was sent off to culture. He did report having chills/rigors the evening of 9/30. After aspiration, he was started on cephalexin until he could come into the hospital. He was admitted on 10/5 and undewent I x D  With polyexchange. He is mssa colonized. Cultures are pending. He was started on vancomycin and given cefazolin peri-operative antibiotics. picc line placed. Patient feels that his leg is much improved. He remains afebrile. He has concerns about giving himself iv abtx since he has many responsibilities at home. 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  Past Medical History:  Diagnosis Date  . Arthritis    OA  . Diabetes mellitus without complication (HCC)    TYPE 2  . History of colon polyps 2015  . Lumbar stress fracture AGE 30   NO SX DONE, HEALED  . 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 He currently does not take nothing for it. He declines meds and is tired.     Allergies:  Allergies  Allergen Reactions  . Benadryl [Diphenhydramine] Hives and Itching    MEDICATIONS: . aspirin  81 mg Oral BID  . celecoxib  200 mg Oral BID  . Chlorhexidine Gluconate Cloth  6 each Topical Daily  . docusate sodium  100  mg Oral BID  . ferrous sulfate  325 mg Oral BID WC  . insulin aspart  0-15 Units Subcutaneous TID WC  . metFORMIN  500 mg Oral Q supper  . polyethylene glycol  17 g Oral BID  . sodium chloride flush  10-40 mL Intracatheter Q12H    Social History   Tobacco Use  . Smoking status: Never Smoker  . Smokeless tobacco: Never Used  Vaping Use  . Vaping Use: Never used  Substance Use Topics  . Alcohol use: No  . Drug use: No    Family History  Problem Relation Age of Onset  . Hyperlipidemia Mother   . Mental illness Mother     Review of Systems -   Constitutional: positive + chills. Negative for fever, chills, diaphoresis, activity change, appetite change, fatigue and unexpected weight change.  HENT: Negative for congestion, sore throat, rhinorrhea, sneezing, trouble swallowing and sinus pressure.  Eyes: Negative for photophobia and visual disturbance.  Respiratory: Negative for cough, chest tightness, shortness of breath, wheezing and stridor.  Cardiovascular: Negative for chest pain, palpitations and leg swelling.  Gastrointestinal: Negative for nausea, vomiting, abdominal pain, diarrhea, constipation, blood in stool, abdominal distention and anal bleeding.  Genitourinary: Negative for dysuria, hematuria, flank pain and difficulty urinating.  Musculoskeletal: +acute right knee painNegative for myalgias, back pain, joint swelling, arthralgias and gait  problem.  Skin: Negative for color change, pallor, rash and wound.  Neurological: Negative for dizziness, tremors, weakness and light-headedness.  Hematological: Negative for adenopathy. Does not bruise/bleed easily.  Psychiatric/Behavioral: Negative for behavioral problems, confusion, sleep disturbance, dysphoric mood, decreased concentration and agitation.    OBJECTIVE: Temp:  [98 F (36.7 C)-98.6 F (37 C)] 98.2 F (36.8 C) (10/06 1324) Pulse Rate:  [71-86] 75 (10/06 1324) Resp:  [16-19] 16 (10/06 1324) BP: (130-144)/(68-85)  130/68 (10/06 1324) SpO2:  [91 %-98 %] 95 % (10/06 1324) Physical Exam  Constitutional: He is oriented to person, place, and time. He appears well-developed and well-nourished. No distress.  HENT:  Mouth/Throat: Oropharynx is clear and moist. No oropharyngeal exudate.  Cardiovascular: Normal rate, regular rhythm and normal heart sounds. Exam reveals no gallop and no friction rub.  No murmur heard.  Pulmonary/Chest: Effort normal and breath sounds normal. No respiratory distress. He has no wheezes.  Abdominal: Soft. Bowel sounds are normal. He exhibits no distension. There is no tenderness.  Lymphadenopathy:  He has no cervical adenopathy.  Ext: right knee central incision bandaged. Right arm picc line is c/d/i Neurological: He is alert and oriented to person, place, and time.  Skin: Skin is warm and dry. No rash noted. No erythema.  Psychiatric: He has a normal mood and affect. His behavior is normal.     LABS: Results for orders placed or performed during the hospital encounter of 08/15/20 (from the past 48 hour(s))  Glucose, capillary     Status: Abnormal   Collection Time: 08/15/20 10:09 AM  Result Value Ref Range   Glucose-Capillary 131 (H) 70 - 99 mg/dL    Comment: Glucose reference range applies only to samples taken after fasting for at least 8 hours.   Comment 1 Notify RN    Comment 2 Document in Chart   Type and screen Order type and screen if day of surgery is less than 15 days from draw of preadmission visit or order morning of surgery if day of surgery is greater than 6 days from preadmission visit.     Status: None   Collection Time: 08/15/20 10:23 AM  Result Value Ref Range   ABO/RH(D) O POS    Antibody Screen NEG    Sample Expiration      08/18/2020,2359 Performed at [facility], [address], [place], [place] [address]   Basic metabolic panel per protocol     Status: Abnormal   Collection Time: 08/15/20 10:23 AM  Result Value Ref Range    Sodium 141 135 - 145 mmol/L   Potassium 4.2 3.5 - 5.1 mmol/L   Chloride 102 98 - 111 mmol/L   CO2 26 22 - 32 mmol/L   Glucose, Bld 130 (H) 70 - 99 mg/dL    Comment: Glucose reference range applies only to samples taken after fasting for at least 8 hours.   BUN 24 (H) 8 - 23 mg/dL   Creatinine, Ser 9.89 0.61 - 1.24 mg/dL   Calcium 9.1 8.9 - 21.1 mg/dL   GFR calc non Af Amer >60 >60 mL/min   Anion gap 13 5 - 15    Comment: Performed at [facility], [address] [place], [place] [address]  Hemoglobin A1c per protocol     Status: Abnormal   Collection Time: 08/15/20 10:23 AM  Result Value Ref Range   Hgb A1c MFr Bld 6.3 (H) 4.8 - 5.6 %    Comment: (NOTE) Pre diabetes:  5.7%-6.4%  Diabetes:              >6.4%  Glycemic control for   <7.0% adults with diabetes    Mean Plasma Glucose 134.11 mg/dL    Comment: Performed at [facility], [address] [place], [place] [address]  CBC per protocol     Status: None   Collection Time: 08/15/20 10:23 AM  Result Value Ref Range   WBC 9.6 4.0 - 10.5 K/uL   RBC 4.84 4.22 - 5.81 MIL/uL   Hemoglobin 14.6 13.0 - 17.0 g/dL   HCT 73.2 39 - 52 %   MCV 90.9 80.0 - 100.0 fL   MCH 30.2 26.0 - 34.0 pg   MCHC 33.2 30.0 - 36.0 g/dL   RDW 20.2 54.2 - 70.6 %   Platelets 215 150 - 400 K/uL   nRBC 0.0 0.0 - 0.2 %    Comment: Performed at [facility], [address], [place], [place] [address]  Surgical pcr screen     Status: Abnormal   Collection Time: 08/15/20 10:30 AM   Specimen: Nasal Mucosa; Nasal Swab  Result Value Ref Range   MRSA, PCR NEGATIVE NEGATIVE   Staphylococcus aureus POSITIVE (A) NEGATIVE    Comment: (NOTE) The Xpert SA Assay (FDA approved for NASAL specimens in patients 68 years of age and older), is one component of a comprehensive surveillance program. It is not intended to diagnose infection nor to guide or monitor treatment. Performed at [facility],  [address] [place], [place] [address]   Aerobic/Anaerobic Culture (surgical/deep wound)     Status: None (Preliminary result)   Collection Time: 08/15/20 12:49 PM   Specimen: Synovial, Right Knee; Body Fluid  Result Value Ref Range   Specimen Description      SYNOVIAL RIGHT KNEE Performed at [facility], [address], [place], [place] [address]    Special Requests      NONE Performed at [facility], [address], [place], [place] [address]    Gram Stain      ABUNDANT WBC PRESENT,BOTH PMN AND MONONUCLEAR NO ORGANISMS SEEN    Culture      NO GROWTH < 24 HOURS Performed at [facility], [address], [place], [place] [address]    Report Status PENDING   Glucose, capillary     Status: Abnormal   Collection Time: 08/15/20  3:00 PM  Result Value Ref Range   Glucose-Capillary 127 (H) 70 - 99 mg/dL    Comment: Glucose reference range applies only to samples taken after fasting for at least 8 hours.   Comment 1 Document in Chart   Glucose, capillary     Status: Abnormal   Collection Time: 08/15/20  4:47 PM  Result Value Ref Range   Glucose-Capillary 141 (H) 70 - 99 mg/dL    Comment: Glucose reference range applies only to samples taken after fasting for at least 8 hours.  Glucose, capillary     Status: Abnormal   Collection Time: 08/15/20 11:22 PM  Result Value Ref Range   Glucose-Capillary 183 (H) 70 - 99 mg/dL    Comment: Glucose reference range applies only to samples taken after fasting for at least 8 hours.  CBC     Status: None   Collection Time: 08/16/20  3:41 AM  Result Value Ref Range   WBC 10.5 4.0 - 10.5 K/uL   RBC 4.59 4.22 - 5.81 MIL/uL  Hemoglobin 13.8 13.0 - 17.0 g/dL   HCT 16.141.8 39 - 52 %   MCV 91.1 80.0 - 100.0 fL   MCH 30.1 26.0 - 34.0 pg   MCHC 33.0 30.0 - 36.0 g/dL   RDW 09.614.0 04.511.5 - 40.915.5 %   Platelets 222 150 - 400 K/uL   nRBC 0.0 0.0 - 0.2 %    Comment: Performed at [facility], [address] [place], [place] [address]  Basic metabolic panel     Status: Abnormal   Collection Time: 08/16/20  3:41 AM  Result Value Ref Range   Sodium 138 135 - 145 mmol/L   Potassium 4.9 3.5 - 5.1 mmol/L   Chloride 104 98 - 111 mmol/L   CO2 27 22 - 32 mmol/L   Glucose, Bld 186 (H) 70 - 99 mg/dL    Comment: Glucose reference range applies only to samples taken after fasting for at least 8 hours.   BUN 25 (H) 8 - 23 mg/dL   Creatinine, Ser 4.781.11 0.61 - 1.24 mg/dL   Calcium 8.5 (L) 8.9 - 10.3 mg/dL   GFR calc non Af Amer >60 >60 mL/min   Anion gap 7 5 - 15    Comment: Performed at [facility], [address] [place], [place] [address]  Glucose, capillary     Status: Abnormal   Collection Time: 08/16/20  7:37 AM  Result Value Ref Range   Glucose-Capillary 160 (H) 70 - 99 mg/dL    Comment: Glucose reference range applies only to samples taken after fasting for at least 8 hours.  Glucose, capillary     Status: Abnormal   Collection Time: 08/16/20 11:34 AM  Result Value Ref Range   Glucose-Capillary 198 (H) 70 - 99 mg/dL    Comment: Glucose reference range applies only to samples taken after fasting for at least 8 hours.    MICRO: Reviewed - staph species on outside cultures, hospital cultures are still pending IMAGING: US EKG SITE RITE  Result Date: 08/15/2020 If Site Rite image not attached, placement could not be confirmed due to current cardiac rhythm.  US EKG SITE RITE  Result Date: 08/15/2020 If Site Rite image not attached, placement could not be confirmed due to current cardiac rhythm.    Assessment/Plan:  64yo M with acute right partial prosthetic knee infection s/p I x D with polyexchange on 10/5. On vancomycin  - will follow up cultures from office plus those from hospital - suspect that he has oxacillin sensitive staph species, will change his abtx to cefazolin 2gm IV A 8hr - will check sed rate nad crp - plan for 6 wks. -opat  orders in the chart  Mata Rowen B. Drue SecondSnider MD MPH [facility] [phone]

## 2020-08-16 NOTE — Progress Notes (Addendum)
PHARMACY CONSULT NOTE FOR:  OUTPATIENT  PARENTERAL ANTIBIOTIC THERAPY (OPAT)  Indication: Prosthetic knee infection Regimen: cefazolin 2g IV q8h  End date: 09/26/2020  IV antibiotic discharge orders are pended. To discharging provider:  please sign these orders via discharge navigator,  Select New Orders & click on the button choice - Manage This Unsigned Work.     Thank you for allowing pharmacy to be a part of this patient's care.  Jeannetta Nap 08/16/2020, 4:44 PM

## 2020-08-16 NOTE — Progress Notes (Addendum)
     Subjective: 1 Day Post-Op Procedure(s) (LRB): RIGHT UNI COMPARTMENTAL KNEE IRRIGATION AND DEBRIDEMENT KNEE WITH POLY EXCHANGE (Right)   Patient reports pain as mild, pain controlled.  No reported events throughout the night.  Discussed the procedure, findings and expectations moving forward.  Discharged home probably tomorrow.  Discussed that he already has his PICC line.  We are just waiting on ID to make a determination on antibiotics and to make sure HH is arranged.       Objective:   VITALS:    08/16/20 0459  BP: 133/71  Pulse: 71  Resp: 19  Temp: 98.6 F (37 C)  SpO2: 98%    Dorsiflexion/Plantar flexion intact Incision: dressing C/D/I No cellulitis present Compartment soft  LABS Recent Labs    08/15/20 1023 08/16/20 0341  HGB 14.6 13.8  HCT 44.0 41.8  WBC 9.6 10.5  PLT 215 222    Recent Labs    08/15/20 1023 08/16/20 0341  NA 141 138  K 4.2 4.9  BUN 24* 25*  CREATININE 1.12 1.11  GLUCOSE 130* 186*     Assessment/Plan: 1 Day Post-Op Procedure(s) (LRB): RIGHT UNI COMPARTMENTAL KNEE IRRIGATION AND DEBRIDEMENT KNEE WITH POLY EXCHANGE (Right)   Up with therapy Discharge home with home health probably tomorrow        Lanney Gins PA-C  [facility]  [place] [address], [address], [place], [place] [address] Phone: [phone] [email or web address] Facebook  [employer]

## 2020-08-16 NOTE — Evaluation (Signed)
Physical Therapy One Time Evaluation Patient Details Name: Jesus Roye Sr. MRN: [id number] DOB: 24-Nov-1955 Today's Date: 08/16/2020   History of Present Illness  Pt is a 64 year old male s/p I&D with poly exchange of the right UKR on10/5/21  Clinical Impression  Patient evaluated by Physical Therapy with no further acute PT needs identified. All education has been completed and the patient has no further questions. See below for any follow-up Physical Therapy or equipment needs. PT is signing off. Thank you for this referral. Pt assisted with ambulating in hallway and performing well.   Pt reports pain much improved s/p surgery.  Safe stair technique was verbally reviewed with pt (he felt capable of performing since he was in worse pain prior to surgery and able to perform at home).  Pt states he has exercise handout from previous R UKR and would like to have f/u PT upon d/c home.  Pt educated to mobilize however not overdue activities (as he was very active prior to sudden pain onset).  Pt to d/c home today.     Follow Up Recommendations Follow surgeon's recommendation for DC plan and follow-up therapies (pt would like f/u PT upon d/c)    Equipment Recommendations  None recommended by PT    Recommendations for Other Services       Precautions / Restrictions Precautions Precautions: Knee Restrictions Weight Bearing Restrictions: No      Mobility  Bed Mobility Overal bed mobility: Modified Independent                Transfers Overall transfer level: Needs assistance Equipment used: None Transfers: Sit to/from Stand Sit to Stand: Min guard;Supervision         General transfer comment: utilizes UEs for assist/support on bed, provided RW upon standing  Ambulation/Gait Ambulation/Gait assistance: Min Gaffer (Feet): 300 Feet Assistive device: Rolling walker (2 wheeled) Gait Pattern/deviations: Antalgic;Decreased stance time - right;Step-through  pattern     General Gait Details: verbal cues initially for WBAT and sequencing, RW positioning; pt reports pain much improved s/p surgery  Stairs Stairs:  (pt declines need to practice 2 steps to enter home, verbally reviewed)          Wheelchair Mobility    Modified Rankin (Stroke Patients Only)       Balance                                             Pertinent Vitals/Pain Pain Assessment: 0-10 Pain Score: 3  Pain Location: right knee Pain Descriptors / Indicators: Sore Pain Intervention(s): Repositioned;Monitored during session    Home Living Family/patient expects to be discharged to:: Private residence Living Arrangements: Spouse/significant other;Children   Type of Home: House Home Access: Stairs to enter Entrance Stairs-Rails: Right Entrance Stairs-Number of Steps: 2 Home Layout: One level Home Equipment: Environmental consultant - 2 wheels;Crutches      Prior Function Level of Independence: Independent               Hand Dominance        Extremity/Trunk Assessment        Lower Extremity Assessment Lower Extremity Assessment: RLE deficits/detail RLE Deficits / Details: able to perform ankle ROM (put on shoes in standing), lacking resting knee extension approx 8* and discussed knee extension at rest, functionally observed at leat 50* knee flexion  Communication   Communication: No difficulties  Cognition Arousal/Alertness: Awake/alert Behavior During Therapy: WFL for tasks assessed/performed Overall Cognitive Status: Within Functional Limits for tasks assessed                                        General Comments      Exercises     Assessment/Plan    PT Assessment All further PT needs can be met in the next venue of care  PT Problem List Decreased range of motion;Decreased strength;Decreased mobility;Pain       PT Treatment Interventions      PT Goals (Current goals can be found in the Care Plan  section)  Acute Rehab PT Goals PT Goal Formulation: All assessment and education complete, DC therapy    Frequency     Barriers to discharge        Co-evaluation               AM-PAC PT "6 Clicks" Mobility  Outcome Measure Help needed turning from your back to your side while in a flat bed without using bedrails?: None Help needed moving from lying on your back to sitting on the side of a flat bed without using bedrails?: None Help needed moving to and from a bed to a chair (including a wheelchair)?: None Help needed standing up from a chair using your arms (e.g., wheelchair or bedside chair)?: A Little Help needed to walk in hospital room?: A Little Help needed climbing 3-5 steps with a railing? : A Little 6 Click Score: 21    End of Session   Activity Tolerance: Patient tolerated treatment well Patient left: in bed;with call bell/phone within reach;with family/visitor present Nurse Communication: Mobility status PT Visit Diagnosis: Other abnormalities of gait and mobility (R26.89)    Time: 5183-3582 PT Time Calculation (min) (ACUTE ONLY): 19 min   Charges:   PT Evaluation $PT Eval Low Complexity: 1 Low     Kati PT, DPT Acute Rehabilitation Services Pager: [phone] Office: [phone]  York Ram E 08/16/2020, 12:14 PM

## 2020-08-16 NOTE — TOC Transition Note (Signed)
Transition of Care [facility]) - CM/SW Discharge Note   Patient Details  Name: Jesus Kiel Sr. MRN: [id number] Date of Birth: 1955/12/23  [facility]) CM/SW Contact:  Lennart Pall, LCSW Phone Number: 08/16/2020, 12:02 PM   Clinical Narrative:    Met with pt and wife today to review dc needs/ referrals.  Pt aware that he will be dc'd with IV abx and have explained that I have arranged HHRN and PT.  Unfortunately, due to restricted insurance network and staffing issues, have had to use 3 different agencies to cover patient's follow up needs. [facility] to provide IV abx [facility] to provide [place] to provide HHPT TOC will continue to follow and alert agencies with dc orders are in.    Final next level of care: [place] Barriers to Discharge: Continued Medical Work up   Patient Goals and CMS Choice Patient states their goals for this hospitalization and ongoing recovery are:: to return home CMS Medicare.gov Compare Post Acute Care list provided to:: Patient Choice offered to / list presented to : Patient  Discharge Placement                       Discharge Plan and Services                DME Arranged: N/A (already has needed DME) DME Agency: NA       HH Arranged: RN, PT [place] Agency: [facility], Other - See comment ([facility]; [facility]) Date [place]: 08/16/20 Time [place]: 1100 Representative spoke with at [place]: [place];  [place] [facility] Sayers referred); [place]  Social Determinants of Health (SDOH) Interventions     Readmission Risk Interventions Readmission Risk Prevention Plan 08/16/2020  Post Dischage Appt Complete  Medication Screening Complete  Transportation Screening Complete  Some recent data might be hidden

## 2020-08-17 ENCOUNTER — Telehealth: Payer: Self-pay

## 2020-08-17 DIAGNOSIS — T8453XD Infection and inflammatory reaction due to internal right knee prosthesis, subsequent encounter: Secondary | ICD-10-CM

## 2020-08-17 DIAGNOSIS — A4101 Sepsis due to Methicillin susceptible Staphylococcus aureus: Secondary | ICD-10-CM

## 2020-08-17 DIAGNOSIS — T8453XA Infection and inflammatory reaction due to internal right knee prosthesis, initial encounter: Secondary | ICD-10-CM | POA: Diagnosis not present

## 2020-08-17 LAB — BASIC METABOLIC PANEL
Anion gap: 8 (ref 5–15)
BUN: 31 mg/dL — ABNORMAL HIGH (ref 8–23)
CO2: 28 mmol/L (ref 22–32)
Calcium: 8.7 mg/dL — ABNORMAL LOW (ref 8.9–10.3)
Chloride: 101 mmol/L (ref 98–111)
Creatinine, Ser: 1.01 mg/dL (ref 0.61–1.24)
GFR calc non Af Amer: >60 mL/min (ref >60)
Glucose, Bld: 156 mg/dL — ABNORMAL HIGH (ref 70–99)
Potassium: 4.4 mmol/L (ref 3.5–5.1)
Sodium: 137 mmol/L (ref 135–145)

## 2020-08-17 LAB — CBC
HCT: 42.6 % (ref 39.0–52.0)
Hemoglobin: 13.8 g/dL (ref 13.0–17.0)
MCH: 29.1 pg (ref 26.0–34.0)
MCHC: 32.4 g/dL (ref 30.0–36.0)
MCV: 89.7 fL (ref 80.0–100.0)
Platelets: 329 10*3/uL (ref 150–400)
RBC: 4.75 MIL/uL (ref 4.22–5.81)
RDW: 13.9 % (ref 11.5–15.5)
WBC: 13.1 10*3/uL — ABNORMAL HIGH (ref 4.0–10.5)
nRBC: 0 % (ref 0.0–0.2)

## 2020-08-17 LAB — SEDIMENTATION RATE: Sed Rate: 34 mm/hr — ABNORMAL HIGH (ref 0–16)

## 2020-08-17 LAB — GLUCOSE, CAPILLARY
Glucose-Capillary: 136 mg/dL — ABNORMAL HIGH (ref 70–99)
Glucose-Capillary: 126 mg/dL — ABNORMAL HIGH (ref 70–99)
Glucose-Capillary: 172 mg/dL — ABNORMAL HIGH (ref 70–99)
Glucose-Capillary: 133 mg/dL — ABNORMAL HIGH (ref 70–99)

## 2020-08-17 LAB — C-REACTIVE PROTEIN: CRP: 7 mg/dL — ABNORMAL HIGH (ref <1.0)

## 2020-08-17 MED ORDER — HYDROCODONE-ACETAMINOPHEN 7.5-325 MG PO TABS: 1.0000 | ORAL_TABLET | ORAL | 0 refills | Status: DC | PRN

## 2020-08-17 MED ORDER — RIFAMPIN 300 MG PO CAPS: 300.0000 mg | ORAL_CAPSULE | Freq: Two times a day (BID) | ORAL | 0 refills | Status: DC

## 2020-08-17 MED ORDER — POLYETHYLENE GLYCOL 3350 17 G PO PACK: 17.0000 g | PACK | Freq: Two times a day (BID) | ORAL | 0 refills | Status: DC

## 2020-08-17 MED ORDER — RIFAMPIN 300 MG PO CAPS
300.0000 mg | ORAL_CAPSULE | Freq: Two times a day (BID) | ORAL | Status: DC
  Administered 2020-08-17: 300 mg via ORAL
  Filled 2020-08-17: qty 1

## 2020-08-17 MED ORDER — METHOCARBAMOL 500 MG PO TABS: 500.0000 mg | ORAL_TABLET | Freq: Four times a day (QID) | ORAL | 0 refills | Status: DC | PRN

## 2020-08-17 MED ORDER — ASPIRIN 81 MG PO CHEW: 81.0000 mg | CHEWABLE_TABLET | Freq: Two times a day (BID) | ORAL | 0 refills | Status: AC

## 2020-08-17 MED ORDER — DOCUSATE SODIUM 100 MG PO CAPS: 100.0000 mg | ORAL_CAPSULE | Freq: Two times a day (BID) | ORAL | 0 refills | Status: DC

## 2020-08-17 MED ORDER — FERROUS SULFATE 325 (65 FE) MG PO TABS: 325.0000 mg | ORAL_TABLET | Freq: Three times a day (TID) | ORAL | 0 refills | Status: DC

## 2020-08-17 MED ORDER — HEPARIN SOD (PORK) LOCK FLUSH 100 UNIT/ML IV SOLN
250.0000 [IU] | INTRAVENOUS | Status: AC | PRN
  Administered 2020-08-17: 250 [IU]
  Filled 2020-08-17: qty 2.5

## 2020-08-17 MED ORDER — CEFAZOLIN IV (FOR PTA / DISCHARGE USE ONLY): 2.0000 g | Freq: Three times a day (TID) | INTRAVENOUS | 0 refills | Status: DC

## 2020-08-17 NOTE — Progress Notes (Signed)
Pt alert and oriented, d/c instructions given. IV team came and prepped PICC for home.  Pt d/cd to home.

## 2020-08-17 NOTE — Progress Notes (Signed)
Patient ID: Jesus Mccurdy Sr., male   DOB: 1956/03/19, 64 y.o.   MRN: [id number] Subjective: 2 Days Post-Op Procedure(s) (LRB): RIGHT UNI COMPARTMENTAL KNEE IRRIGATION AND DEBRIDEMENT KNEE WITH POLY EXCHANGE (Right)    Patient reports pain as mild. He states that his knee feels a lot better than Friday. Biggest issue is lack of sleep the past 2 nights  Objective:   VITALS:   Vitals:   08/16/20 2244 08/17/20 0641  BP: 138/75 (!) 141/86  Pulse: 63 71  Resp:  18  Temp: 98.4 F (36.9 C) 97.7 F (36.5 C)  SpO2: 96% 98%    Neurovascular intact Incision: dressing C/D/I  LABS Recent Labs    08/15/20 1023 08/16/20 0341 08/17/20 0329  HGB 14.6 13.8 13.8  HCT 44.0 41.8 42.6  WBC 9.6 10.5 13.1*  PLT 215 222 329    Recent Labs    08/15/20 1023 08/16/20 0341 08/17/20 0329  NA 141 138 137  K 4.2 4.9 4.4  BUN 24* 25* 31*  CREATININE 1.12 1.11 1.01  GLUCOSE 130* 186* 156*    No results for input(s): LABPT, INR in the last 72 hours.   Assessment/Plan: 2 Days Post-Op Procedure(s) (LRB): RIGHT UNI COMPARTMENTAL KNEE IRRIGATION AND DEBRIDEMENT KNEE WITH POLY EXCHANGE (Right)   Up with therapy  Home this am with [facility] for IV antibiotics I appreciate the help from Dr. Drue Second for antibiotic selection Planned reviewed with patient We will see him back in the office in 2 weeks

## 2020-08-17 NOTE — Progress Notes (Signed)
Notified by nurse Apolonio Schneiders that patient was requesting pain medication. I promptly finished up in another patients room and went straight to this patient room. Patient was irrate, screaming loudly that this place is "a fucking joke", and "do you know how much sleep I have gotten?" and "I'm getting weaker, not better", and " do you know how much I pay to be here?", patient using profanity and when I tried to let him know that I came in as soon as I was notified of his need he screamed that it takes an hour and he never sleeps and he continued to cuss. I asked him to stop yelling at staff and cussing and then I would proceed to get his medication. I then left the room as I was not tolerating that behavior any longer. Will attempt to check on patient again right now and see if he is ready to be medicated.

## 2020-08-17 NOTE — Plan of Care (Signed)

## 2020-08-17 NOTE — Progress Notes (Signed)
°    [facility]    Date of Admission:  08/15/2020   Total days of antibiotics 3          ID: Jesus Boyajian Sr. is a 64 y.o. male with MSSA partial right knee PJI Principal Problem:   Infected right UKR    Subjective: Afebrile, denies trouble with his picc line.   Medications:   aspirin  81 mg Oral BID   celecoxib  200 mg Oral BID   Chlorhexidine Gluconate Cloth  6 each Topical Daily   docusate sodium  100 mg Oral BID   ferrous sulfate  325 mg Oral BID WC   insulin aspart  0-15 Units Subcutaneous TID WC   metFORMIN  500 mg Oral Q supper   polyethylene glycol  17 g Oral BID   rifampin  300 mg Oral Q12H   sodium chloride flush  10-40 mL Intracatheter Q12H    Objective: Vital signs in last 24 hours: Temp:  [97.7 F (36.5 C)-98.4 F (36.9 C)] 97.7 F (36.5 C) (10/07 0641) Pulse Rate:  [63-71] 71 (10/07 0641) Resp:  [18] 18 (10/07 0641) BP: (138-141)/(75-86) 141/86 (10/07 0641) SpO2:  [96 %-98 %] 98 % (10/07 0641) Physical Exam  Constitutional: He is oriented to person, place, and time. He appears well-developed and well-nourished. No distress.  HENT:  Mouth/Throat: Oropharynx is clear and moist. No oropharyngeal exudate.  Cardiovascular: Normal rate, regular rhythm and normal heart sounds. Exam reveals no gallop and no friction rub.  No murmur heard.  Pulmonary/Chest: Effort normal and breath sounds normal. No respiratory distress. He has no wheezes.  Abdominal: Soft. Bowel sounds are normal. He exhibits no distension. There is no tenderness.  Ext: right arm picc line -- c/d/i for now. Right knee wrapped  Neurological: He is alert and oriented to person, place, and time.  Skin: Skin is warm and dry. No rash noted. No erythema.  Psychiatric: He has a normal mood and affect. His behavior is normal.     Lab Results Recent Labs    08/16/20 0341 08/17/20 0329  WBC 10.5 13.1*  HGB 13.8 13.8  HCT 41.8 42.6  NA 138 137  K 4.9 4.4  CL  104 101  CO2 27 28  BUN 25* 31*  CREATININE 1.11 1.01    Microbiology: Outside labs showing MSSA Studies/Results: No results found.   Assessment/Plan: MSSA PJI = will add rifampin 300mg  PO BID plus  Cefazolin 2gm IV q 8hr. Will plan to treat for 6 wk. OPAT orders already submitted. We will have him come back to the ID clinic in 5-6 wk for follow up. He will have weekly cmp, sed rate, crp and cbc.  [facility] Cell: [phone] Pager: [phone]  08/17/2020, 3:21 PM

## 2020-08-17 NOTE — Telephone Encounter (Signed)
-----   Message from Jose L Maldonado, CMA sent at 08/17/2020  3:38 PM EDT -----  ----- Message ----- From: Snider, Cynthia, MD Sent: 08/17/2020   3:26 PM EDT To: [facility] Triage Nurse Pool  Can I see him back 5-6 wk   

## 2020-08-17 NOTE — Progress Notes (Signed)
Right knee culture and susceptibilities

## 2020-08-17 NOTE — Telephone Encounter (Signed)
Left patient a voice mail to call back to schedule a 5 to 6 week follow up with Dr. Drue Second

## 2020-08-17 NOTE — Telephone Encounter (Signed)
Have called patient and left a voice mail to call back to schedule a 5-6 week follow with Dr. Drue Second

## 2020-08-17 NOTE — Progress Notes (Signed)
Nurse requested VAST to return when ATB completed to draw labs and cap of patient line for home. Instructed nurse to re-consult VAST when ready. Nurse VU. Tomasita Morrow, RN VAST

## 2020-08-18 NOTE — Discharge Summary (Signed)
Patient ID: Jesus Fam Sr. MRN: [id number] DOB/AGE: [birth year] 64 y.o.  Admit date: 08/15/2020 Discharge date: 08/18/2020  Admission Diagnoses:  Principal Problem:   Infected right UKR   Discharge Diagnoses:  Same  Past Medical History:  Diagnosis Date  . Arthritis    OA  . Diabetes mellitus without complication ([place])    TYPE 2  . History of colon polyps 2015  . Lumbar stress fracture AGE 38   NO SX DONE, HEALED  . 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 He currently does not take nothing for it. He declines meds and is tired.     Surgeries: Procedure(s): RIGHT UNI COMPARTMENTAL KNEE IRRIGATION AND DEBRIDEMENT KNEE WITH POLY EXCHANGE on 08/15/2020   Consultants: Infectious Disease  Discharged Condition: Improved  Hospital Course: Jesus Nunez. is an 64 y.o. male who was admitted 08/15/2020 for operative treatment of  Infection of prosthetic right knee joint ([place]). Patient has severe unremitting pain that affects sleep, daily activities, and work/hobbies. After pre-op clearance the patient was taken to the operating room on 08/15/2020 and underwent  Procedure(s): RIGHT UNI COMPARTMENTAL KNEE IRRIGATION AND DEBRIDEMENT KNEE WITH POLY EXCHANGE.    Patient was given perioperative antibiotics:  Anti-infectives (From admission, onward)   Start     Dose/Rate Route Frequency Ordered Stop   08/17/20 1600  rifampin (RIFADIN) capsule 300 mg  Status:  Discontinued        300 mg Oral Every 12 hours 08/17/20 1513 08/17/20 2253   08/17/20 0000  ceFAZolin (ANCEF) IVPB        2 g Intravenous Every 8 hours 08/17/20 0924 09/27/20 2359   08/17/20 0000  rifampin (RIFADIN) 300 MG capsule        300 mg Oral 2 times daily 08/17/20 1503 09/26/20 2359   08/16/20 1645  ceFAZolin (ANCEF) IVPB 2g/100 mL premix  Status:  Discontinued        2 g 200 mL/hr over 30 Minutes Intravenous Every 8 hours 08/16/20 1643 08/17/20 2253   08/16/20 0600  vancomycin (VANCOREADY) IVPB 750 mg/150 mL   Status:  Discontinued        750 mg 150 mL/hr over 60 Minutes Intravenous Every 12 hours 08/15/20 1556 08/16/20 1643   08/15/20 1815  ceFAZolin (ANCEF) IVPB 2g/100 mL premix        2 g 200 mL/hr over 30 Minutes Intravenous Every 6 hours 08/15/20 1328 08/15/20 2355   08/15/20 1600  vancomycin (VANCOREADY) IVPB 2000 mg/400 mL        2,000 mg 200 mL/hr over 120 Minutes Intravenous  Once 08/15/20 1529 08/15/20 1807   08/15/20 1247  vancomycin (VANCOCIN) powder  Status:  Discontinued          As needed 08/15/20 1248 08/15/20 1505   08/15/20 1000  ceFAZolin (ANCEF) IVPB 2g/100 mL premix        2 g 200 mL/hr over 30 Minutes Intravenous On call to O.R. 08/15/20 7829 08/15/20 1212       Patient was given sequential compression devices, early ambulation, and chemoprophylaxis to prevent DVT.  Patient benefited maximally from hospital stay and there were no complications.    Recent vital signs:  Patient Vitals for the past 24 hrs:  BP Temp Temp src Pulse Resp SpO2  08/17/20 1534 (!) 143/72 98 F (36.7 C) Oral 72 18 95 %     Recent laboratory studies:  Recent Labs    08/16/20 0341 08/16/20 0341 08/17/20 0329  WBC  10.5  --  13.1*  HGB 13.8  --  13.8  HCT 41.8  --  42.6  PLT 222  --  329  NA 138  --  137  K 4.9  --  4.4  CL 104  --  101  CO2 27  --  28  BUN 25*  --  31*  CREATININE 1.11  --  1.01  GLUCOSE 186*  --  156*  CALCIUM 8.5*   < > 8.7*   < > = values in this interval not displayed.     Discharge Medications:   Allergies as of 08/17/2020      Reactions   Benadryl [diphenhydramine] Hives, Itching      Medication List    STOP taking these medications   cephALEXin 500 MG capsule Commonly known as: KEFLEX   diclofenac 75 MG EC tablet Commonly known as: VOLTAREN   HYDROcodone-acetaminophen 5-325 MG tablet Commonly known as: NORCO/VICODIN Replaced by: HYDROcodone-acetaminophen 7.5-325 MG tablet   ibuprofen 200 MG tablet Commonly known as: ADVIL     TAKE  these medications   aspirin 81 MG chewable tablet Commonly known as: Aspirin Childrens Chew 1 tablet (81 mg total) by mouth 2 (two) times daily. Take for 4 weeks, then resume regular dose.   ceFAZolin  IVPB Commonly known as: ANCEF Inject 2 g into the vein every 8 (eight) hours. Indication:  Prosthetic knee infection First Dose: no Last Day of Therapy:  09/26/2020 Labs - Once weekly:  CBC/D and BMP, Labs - Every other week:  ESR and CRP Method of administration: IV Push Method of administration may be changed at the discretion of home infusion pharmacist based upon assessment of the patient and/or caregiver's ability to self-administer the medication ordered.   docusate sodium 100 MG capsule Commonly known as: Colace Take 1 capsule (100 mg total) by mouth 2 (two) times daily.   ferrous sulfate 325 (65 FE) MG tablet Commonly known as: FerrouSul Take 1 tablet (325 mg total) by mouth 3 (three) times daily with meals for 14 days.   Fish Oil Ultra 1400 MG Caps Take 1,400 mg by mouth daily.   HYDROcodone-acetaminophen 7.5-325 MG tablet Commonly known as: Norco Take 1-2 tablets by mouth every 4 (four) hours as needed for moderate pain. Replaces: HYDROcodone-acetaminophen 5-325 MG tablet   Melatonin 5 MG Caps Take 10 mg by mouth at bedtime.   metFORMIN 500 MG 24 hr tablet Commonly known as: GLUCOPHAGE-XR Take 1 tablet (500 mg total) by mouth daily with supper.   methocarbamol 500 MG tablet Commonly known as: Robaxin Take 1 tablet (500 mg total) by mouth every 6 (six) hours as needed for muscle spasms.   OSTEO BI-FLEX REGULAR STRENGTH PO Take 2 tablets by mouth daily.   OVER THE COUNTER MEDICATION Take 2 capsules by mouth daily. Cinsulin (Cinnamon Chromium Picolinate Vitamin D3)   polyethylene glycol 17 g packet Commonly known as: MIRALAX / GLYCOLAX Take 17 g by mouth 2 (two) times daily.   rifampin 300 MG capsule Commonly known as: Rifadin Take 1 capsule (300 mg total)  by mouth 2 (two) times daily.   TURMERIC PO Take 2,000 mg by mouth daily. 1000 MG/CAPSULE            Discharge Care Instructions  (From admission, onward)         Start     Ordered   08/17/20 0000  Change dressing on IV access line weekly and PRN  (Home infusion instructions - [facility] )        08/17/20 0924   08/17/20 0000  Change dressing       Comments: Maintain surgical dressing until follow up in the clinic. If the edges start to pull up, may reinforce with tape. If the dressing is no longer working, may remove and cover with gauze and tape, but must keep the area dry and clean.  Call with any questions or concerns.   08/17/20 0924          Diagnostic Studies: [place] EKG SITE RITE  Result Date: 08/15/2020 If Site Rite image not attached, placement could not be confirmed due to current cardiac rhythm.  [place] EKG SITE RITE  Result Date: 08/15/2020 If Site Rite image not attached, placement could not be confirmed due to current cardiac rhythm.   Disposition: Home  Discharge Instructions    [facility] pharmacist to adjust dose for Vancomycin, Aminoglycosides and other anti-infective therapies as requested by physician.   Complete by: As directed    [facility] to provide Cath Flo 10m   Complete by: As directed    Administer for PICC line occlusion and as ordered by physician for other access device issues.   Anaphylaxis Kit: Provided to treat any anaphylactic reaction to the medication being provided to the patient if First Dose or when requested by physician   Complete by: As directed    Epinephrine 1272mml vial / amp: Administer 0.72m57m0.72ml58mubcutaneously once for moderate to severe anaphylaxis, nurse to call physician and pharmacy when reaction occurs and call 911 if needed for immediate care   Diphenhydramine 50mg57mIV vial: Administer 25-50mg 64mM PRN for first dose reaction, rash, itching, mild reaction, nurse to call physician and  pharmacy when reaction occurs   Sodium Chloride 0.9% NS 500ml I7mdminister if needed for hypovolemic blood pressure drop or as ordered by physician after call to physician with anaphylactic reaction   Call MD / Call 911   Complete by: As directed    If you experience chest pain or shortness of breath, CALL 911 and be transported to the hospital emergency room.  If you develope a fever above 101 F, pus (white drainage) or increased drainage or redness at the wound, or calf pain, call your surgeon's office.   Change dressing   Complete by: As directed    Maintain surgical dressing until follow up in the clinic. If the edges start to pull up, may reinforce with tape. If the dressing is no longer working, may remove and cover with gauze and tape, but must keep the area dry and clean.  Call with any questions or concerns.   Change dressing on IV access line weekly and PRN   Complete by: As directed    Constipation Prevention   Complete by: As directed    Drink plenty of fluids.  Prune juice may be helpful.  You may use a stool softener, such as Colace (over the counter) 100 mg twice a day.  Use MiraLax (over the counter) for constipation as needed.   Diet - low sodium heart healthy   Complete by: As directed    Discharge instructions   Complete by: As directed    Maintain surgical dressing until follow up in the clinic. If the edges start to pull up, may reinforce with tape. If the dressing is no longer working, may remove and cover with gauze and tape, but must keep the area dry and clean.  Follow [facility]  in [facility] at [facility]. Call with any questions or concerns.   Flush IV access with Sodium Chloride 0.9% and Heparin 10 units/ml or 100 units/ml   Complete by: As directed    Home infusion instructions - [facility]   Complete by: As directed    Instructions: Flush IV access with Sodium Chloride 0.9% and Heparin 10units/ml or 100units/ml   Change dressing on IV access line: Weekly and  PRN   Instructions Cath Flo 2m: Administer for PICC Line occlusion and as ordered by physician for other access device   [facility] pharmacist to adjust dose for: Vancomycin, Aminoglycosides and other anti-infective therapies as requested by physician   Increase activity slowly as tolerated   Complete by: As directed    Weight bearing as tolerated with assist device (walker, cane, etc) as directed, use it as long as suggested by your surgeon or therapist, typically at least 4-6 weeks.   Method of administration may be changed at the discretion of home infusion pharmacist based upon assessment of the patient and/or caregiver's ability to self-administer the medication ordered   Complete by: As directed    TED hose   Complete by: As directed    Use stockings (TED hose) for 2 weeks on both leg(s).  You may remove them at night for sleeping.       Follow-up Information    OParalee Cancel MD. Schedule an appointment as soon as possible for a visit in 2 weeks.   Specialty: Orthopedic Surgery Contact information: [phone]       [facility].   Specialty: Home Health Services Why: to provide home health physical therapy visits Contact information: [address] [place] [phone]        [facility] Follow up.   Why: to provide home health nursing visits Contact information: [phone]              Signed: MLucille PassyBClark Fork Valley Hospital10/06/2020, 8:09 AM

## 2020-08-19 LAB — AEROBIC/ANAEROBIC CULTURE W GRAM STAIN (SURGICAL/DEEP WOUND)

## 2020-08-21 ENCOUNTER — Telehealth: Payer: Self-pay

## 2020-08-21 NOTE — Telephone Encounter (Signed)
Left patient a voice mail to call back to schedule a 5-6 week follow up with Dr. Drue Second

## 2020-08-21 NOTE — Telephone Encounter (Signed)
-----   Message from Lorenso Courier, [place] sent at 08/17/2020  3:38 PM EDT -----  ----- Message ----- From: Judyann Munson, MD Sent: 08/17/2020   3:26 PM EDT To: [facility] Triage Nurse Pool  Can I see him back 5-6 wk

## 2020-08-28 ENCOUNTER — Ambulatory Visit: Payer: 59 | Admitting: Family Medicine

## 2020-09-08 ENCOUNTER — Telehealth: Payer: Self-pay

## 2020-09-08 MED ORDER — HYDROXYZINE HCL 25 MG PO TABS: 25.0000 mg | ORAL_TABLET | Freq: Four times a day (QID) | ORAL | 0 refills | Status: DC | PRN

## 2020-09-08 NOTE — Telephone Encounter (Signed)
Received call from [place] with [facility] states patient reports nausea, tingling to face, back, arms, chest with also itchy feeling. Denies shortness of breathe and also feels gas in his chest. Patient advise by Larita Fife to hold cefazolin (Last Day of Therapy:  09/26/2020). Patient being treated for prosthetic joint infection of right knee. Patient to follow up with Dr. Drue Second on 10/02/20 Page placed to Dr. Drue Second for advise. Valarie Cones

## 2020-09-08 NOTE — Addendum Note (Signed)
Addended by: Valarie Cones on: 09/08/2020 03:38 PM   Modules accepted: Orders

## 2020-09-08 NOTE — Telephone Encounter (Signed)
Spoke with Dr. Drue Second and recommends patient take  hydrOXYzine (ATARAX/VISTARIL) 25 MG tablet  Take 1 tablet (25 mg total) by mouth every 6 (six) hours as needed for itching.  Verified pharmacy to send medication electronically.   Patient verbalized understanding. Patient is to continue IV medication as well.   Call also placed to [facility] with [facility] and updated.  Valarie Cones

## 2020-09-09 ENCOUNTER — Encounter (HOSPITAL_COMMUNITY): Payer: Self-pay | Admitting: *Deleted

## 2020-09-09 ENCOUNTER — Emergency Department (HOSPITAL_COMMUNITY): Payer: 59

## 2020-09-09 ENCOUNTER — Other Ambulatory Visit: Payer: Self-pay

## 2020-09-09 ENCOUNTER — Inpatient Hospital Stay (HOSPITAL_COMMUNITY)
Admission: EM | Admit: 2020-09-09 | Discharge: 2020-09-11 | DRG: 607 | Disposition: A | Discharge status: Home Health | Payer: 59 | Attending: Internal Medicine | Admitting: Internal Medicine

## 2020-09-09 DIAGNOSIS — Z888 Allergy status to other drugs, medicaments and biological substances status: Secondary | ICD-10-CM

## 2020-09-09 DIAGNOSIS — E119 Type 2 diabetes mellitus without complications: Secondary | ICD-10-CM | POA: Diagnosis present

## 2020-09-09 DIAGNOSIS — T361X5A Adverse effect of cephalosporins and other beta-lactam antibiotics, initial encounter: Secondary | ICD-10-CM | POA: Diagnosis present

## 2020-09-09 DIAGNOSIS — T8459XA Infection and inflammatory reaction due to other internal joint prosthesis, initial encounter: Secondary | ICD-10-CM

## 2020-09-09 DIAGNOSIS — M25461 Effusion, right knee: Secondary | ICD-10-CM | POA: Diagnosis present

## 2020-09-09 DIAGNOSIS — K719 Toxic liver disease, unspecified: Secondary | ICD-10-CM | POA: Diagnosis not present

## 2020-09-09 DIAGNOSIS — T50905A Adverse effect of unspecified drugs, medicaments and biological substances, initial encounter: Secondary | ICD-10-CM | POA: Diagnosis not present

## 2020-09-09 DIAGNOSIS — L270 Generalized skin eruption due to drugs and medicaments taken internally: Principal | ICD-10-CM | POA: Diagnosis present

## 2020-09-09 DIAGNOSIS — Z6837 Body mass index (BMI) 37.0-37.9, adult: Secondary | ICD-10-CM

## 2020-09-09 DIAGNOSIS — R509 Fever, unspecified: Secondary | ICD-10-CM

## 2020-09-09 DIAGNOSIS — Z7982 Long term (current) use of aspirin: Secondary | ICD-10-CM

## 2020-09-09 DIAGNOSIS — Z7984 Long term (current) use of oral hypoglycemic drugs: Secondary | ICD-10-CM

## 2020-09-09 DIAGNOSIS — Z8719 Personal history of other diseases of the digestive system: Secondary | ICD-10-CM

## 2020-09-09 DIAGNOSIS — T8453XD Infection and inflammatory reaction due to internal right knee prosthesis, subsequent encounter: Secondary | ICD-10-CM

## 2020-09-09 DIAGNOSIS — Z96659 Presence of unspecified artificial knee joint: Secondary | ICD-10-CM | POA: Diagnosis not present

## 2020-09-09 DIAGNOSIS — Z79899 Other long term (current) drug therapy: Secondary | ICD-10-CM | POA: Diagnosis not present

## 2020-09-09 DIAGNOSIS — K716 Toxic liver disease with hepatitis, not elsewhere classified: Secondary | ICD-10-CM | POA: Diagnosis present

## 2020-09-09 DIAGNOSIS — E669 Obesity, unspecified: Secondary | ICD-10-CM | POA: Diagnosis present

## 2020-09-09 DIAGNOSIS — T3695XA Adverse effect of unspecified systemic antibiotic, initial encounter: Secondary | ICD-10-CM

## 2020-09-09 DIAGNOSIS — D721 Eosinophilia, unspecified: Secondary | ICD-10-CM | POA: Diagnosis present

## 2020-09-09 DIAGNOSIS — Z20822 Contact with and (suspected) exposure to covid-19: Secondary | ICD-10-CM | POA: Diagnosis present

## 2020-09-09 DIAGNOSIS — G4733 Obstructive sleep apnea (adult) (pediatric): Secondary | ICD-10-CM | POA: Diagnosis present

## 2020-09-09 DIAGNOSIS — D7212 Adverse effect of unspecified drugs, medicaments and biological substances, initial encounter: Secondary | ICD-10-CM

## 2020-09-09 DIAGNOSIS — R7303 Prediabetes: Secondary | ICD-10-CM | POA: Diagnosis not present

## 2020-09-09 LAB — RESPIRATORY PANEL BY RT PCR (FLU A&B, COVID)
Influenza A by PCR: NEGATIVE
Influenza B by PCR: NEGATIVE
SARS Coronavirus 2 by RT PCR: NEGATIVE

## 2020-09-09 LAB — URINALYSIS, ROUTINE W REFLEX MICROSCOPIC
Bacteria, UA: NONE SEEN
Bilirubin Urine: NEGATIVE
Glucose, UA: NEGATIVE mg/dL
Hgb urine dipstick: NEGATIVE
Ketones, ur: NEGATIVE mg/dL
Leukocytes,Ua: NEGATIVE
Nitrite: NEGATIVE
Protein, ur: 100 mg/dL — AB
Specific Gravity, Urine: 1.026 (ref 1.005–1.030)
pH: 5 (ref 5.0–8.0)

## 2020-09-09 LAB — GLUCOSE, CAPILLARY
Glucose-Capillary: 125 mg/dL — ABNORMAL HIGH (ref 70–99)
Glucose-Capillary: 152 mg/dL — ABNORMAL HIGH (ref 70–99)

## 2020-09-09 LAB — COMPREHENSIVE METABOLIC PANEL
ALT: 232 U/L — ABNORMAL HIGH (ref 0–44)
AST: 272 U/L — ABNORMAL HIGH (ref 15–41)
Albumin: 3.5 g/dL (ref 3.5–5.0)
Alkaline Phosphatase: 76 U/L (ref 38–126)
Anion gap: 9 (ref 5–15)
BUN: 22 mg/dL (ref 8–23)
CO2: 27 mmol/L (ref 22–32)
Calcium: 8.9 mg/dL (ref 8.9–10.3)
Chloride: 97 mmol/L — ABNORMAL LOW (ref 98–111)
Creatinine, Ser: 1.11 mg/dL (ref 0.61–1.24)
GFR, Estimated: >60 mL/min (ref >60)
Glucose, Bld: 138 mg/dL — ABNORMAL HIGH (ref 70–99)
Potassium: 5.1 mmol/L (ref 3.5–5.1)
Sodium: 133 mmol/L — ABNORMAL LOW (ref 135–145)
Total Bilirubin: 1.3 mg/dL — ABNORMAL HIGH (ref 0.3–1.2)
Total Protein: 7.7 g/dL (ref 6.5–8.1)

## 2020-09-09 LAB — CBC WITH DIFFERENTIAL/PLATELET
Abs Immature Granulocytes: 0.02 10*3/uL (ref 0.00–0.07)
Basophils Absolute: 0 10*3/uL (ref 0.0–0.1)
Basophils Relative: 1 %
Eosinophils Absolute: 1.3 10*3/uL — ABNORMAL HIGH (ref 0.0–0.5)
Eosinophils Relative: 22 %
HCT: 43.9 % (ref 39.0–52.0)
Hemoglobin: 14.4 g/dL (ref 13.0–17.0)
Immature Granulocytes: 0 %
Lymphocytes Relative: 14 %
Lymphs Abs: 0.8 10*3/uL (ref 0.7–4.0)
MCH: 29.4 pg (ref 26.0–34.0)
MCHC: 32.8 g/dL (ref 30.0–36.0)
MCV: 89.6 fL (ref 80.0–100.0)
Monocytes Absolute: 0.5 10*3/uL (ref 0.1–1.0)
Monocytes Relative: 8 %
Neutro Abs: 3.2 10*3/uL (ref 1.7–7.7)
Neutrophils Relative %: 55 %
Platelets: 168 10*3/uL (ref 150–400)
RBC: 4.9 MIL/uL (ref 4.22–5.81)
RDW: 13.5 % (ref 11.5–15.5)
WBC: 5.7 10*3/uL (ref 4.0–10.5)
nRBC: 0 % (ref 0.0–0.2)

## 2020-09-09 LAB — PROTIME-INR
INR: 1.1 (ref 0.8–1.2)
Prothrombin Time: 13.8 seconds (ref 11.4–15.2)

## 2020-09-09 LAB — LACTIC ACID, PLASMA
Lactic Acid, Venous: 1.1 mmol/L (ref 0.5–1.9)
Lactic Acid, Venous: 1.1 mmol/L (ref 0.5–1.9)

## 2020-09-09 LAB — C-REACTIVE PROTEIN: CRP: 6.2 mg/dL — ABNORMAL HIGH (ref <1.0)

## 2020-09-09 LAB — SEDIMENTATION RATE: Sed Rate: 15 mm/hr (ref 0–16)

## 2020-09-09 MED ORDER — FAMOTIDINE IN NACL 20-0.9 MG/50ML-% IV SOLN
20.0000 mg | Freq: Once | INTRAVENOUS | Status: AC
  Administered 2020-09-09: 20 mg via INTRAVENOUS
  Filled 2020-09-09: qty 50

## 2020-09-09 MED ORDER — CHLORHEXIDINE GLUCONATE CLOTH 2 % EX PADS
6.0000 | MEDICATED_PAD | Freq: Every day | CUTANEOUS | Status: DC
  Administered 2020-09-11: 6 via TOPICAL

## 2020-09-09 MED ORDER — ENOXAPARIN SODIUM 40 MG/0.4ML ~~LOC~~ SOLN
40.0000 mg | SUBCUTANEOUS | Status: DC
  Administered 2020-09-09 – 2020-09-10 (×2): 40 mg via SUBCUTANEOUS
  Filled 2020-09-09 (×2): qty 0.4

## 2020-09-09 MED ORDER — SODIUM CHLORIDE 0.9 % IV SOLN
900.0000 mg | Freq: Every day | INTRAVENOUS | Status: DC
  Administered 2020-09-09: 900 mg via INTRAVENOUS
  Filled 2020-09-09 (×2): qty 18

## 2020-09-09 MED ORDER — KETOROLAC TROMETHAMINE 15 MG/ML IJ SOLN
15.0000 mg | Freq: Four times a day (QID) | INTRAMUSCULAR | Status: DC | PRN
  Administered 2020-09-10 – 2020-09-11 (×2): 15 mg via INTRAVENOUS
  Filled 2020-09-09 (×2): qty 1

## 2020-09-09 MED ORDER — KETOROLAC TROMETHAMINE 15 MG/ML IJ SOLN
30.0000 mg | Freq: Once | INTRAMUSCULAR | Status: AC
  Administered 2020-09-09: 30 mg via INTRAVENOUS
  Filled 2020-09-09: qty 2

## 2020-09-09 MED ORDER — DIPHENHYDRAMINE HCL 50 MG/ML IJ SOLN
12.5000 mg | Freq: Once | INTRAMUSCULAR | Status: AC
  Administered 2020-09-09: 12.5 mg via INTRAVENOUS
  Filled 2020-09-09: qty 1

## 2020-09-09 MED ORDER — METHOCARBAMOL 500 MG PO TABS: 500.0000 mg | ORAL_TABLET | Freq: Four times a day (QID) | ORAL | Status: DC | PRN

## 2020-09-09 MED ORDER — METHYLPREDNISOLONE SODIUM SUCC 125 MG IJ SOLR
125.0000 mg | Freq: Once | INTRAMUSCULAR | Status: AC
  Administered 2020-09-09: 125 mg via INTRAVENOUS
  Filled 2020-09-09 (×2): qty 2

## 2020-09-09 MED ORDER — DIPHENHYDRAMINE HCL 50 MG/ML IJ SOLN: 25.0000 mg | Freq: Four times a day (QID) | INTRAMUSCULAR | Status: DC | PRN

## 2020-09-09 MED ORDER — POLYETHYLENE GLYCOL 3350 17 G PO PACK: 17.0000 g | PACK | Freq: Every day | ORAL | Status: DC | PRN

## 2020-09-09 NOTE — ED Provider Notes (Addendum)
  Face-to-face evaluation   History: Patient was sent here for possible PICC line infection.  He had a fever of 103.5 yesterday.  He feels generally ill.  Yesterday, infectious disease physician, Dr. Drue Second was contacted regarding possible allergic reaction to antibiotics.  She recommended using hydroxyzine 25 mg every 6 hours as needed itching.  He has been treated as an outpatient with IV antibiotics per PICC line, for knee infection, treated with operative intervention on 08/15/2020.  The patient is using cefazolin, 3 times daily per the PICC line.  He took 2 doses yesterday, skip the evening dose, and took the morning dose today.  He states that these 3 doses were from "a new box."  He states that each time he has had progressively worse reaction, rather immediately after using the medication.  He complains of general tingling, feeling of malaise, and has noticed a progressive rash since the reactions started.  The rash has continued.  He took his first dose of hydroxyzine, this morning prior to the dose of cefazolin.  He also took 2 ibuprofen.  Physical exam: Alert, calm, cooperative.  No respiratory distress.  No dysarthria or aphasia.  He has generalized rash that is consistent with a drug eruption.  The right knee does not appear infected at this time.   Medical screening examination/treatment/procedure(s) were conducted as a shared visit with non-physician practitioner(s) and myself.  I personally evaluated the patient during the encounter        Mancel Bale, MD 09/11/20 [id number]

## 2020-09-09 NOTE — ED Provider Notes (Signed)
[facility] DEPT Provider Note   CSN: [id number] Arrival date & time: 09/09/20  4536     History Chief Complaint  Patient presents with  . PICC Line Infection    Jesus Iles Sr. is a 64 y.o. male.  HPI Patient is a 64 year old gentleman with past medical history of DM type II, infected right knee periprosthetic infection, obesity  Patient is currently on cefazolin and rifampin via PICC line which she has been on since he was discharged 10/5 after MSSA right knee periprosthetic joint infection.  This is irrigated out by [name] of orthopedics.  Infectious disease was consulted for antibiotic plan.  He has been taking his antibiotics as prescribed.  He has had no issues with his antibiotics until yesterday when he did his first dose of cefazolin in the morning and began having nausea, some paresthesias of his chest arms and face with some itching and a T-max of 103.5.  He took a second dose and had similar symptoms he states that they were worse and he skipped a third dose of the day because of concern for reaction.  He states that he has also had a rash diffusely he has right lower extremity, bilateral upper extremities and chest and warmth and flushing to his back with both of his antibiotic doses.  Patient denies any other associated symptoms. Denies any shortness of breath wheezing nausea or vomiting. Denies any aggravating or mitigating factors. He has taken no medications other than hydroxyzine for his symptoms. He states minimal improvement with hydroxyzine.     Past Medical History:  Diagnosis Date  . Arthritis    OA  . Diabetes mellitus without complication ([place])    TYPE 2  . History of colon polyps 2015  . Lumbar stress fracture AGE 38   NO SX DONE, HEALED  . Seizures ([place])    2012, possibly related to drug use. He currently does not take nothing for it. He declines meds and is tired.     Patient Active Problem List   Diagnosis Date  Noted  . OSA (obstructive sleep apnea)   . Adverse reaction to antibiotic, initial encounter 09/09/2020  . 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 Prediabetes 09/09/2020  . Infected right UKR 08/15/2020  . Morbid obesity ([place]) 10/05/2019  . S/P right UKR 07/29/2019  . Healthcare maintenance 07/09/2018  . Benign prostatic hyperplasia with urinary frequency 07/09/2018  . Controlled type 2 diabetes mellitus without complication, without long-term current use of insulin ([place]) 07/09/2018    Past Surgical History:  Procedure Laterality Date  . FRACTURE SURGERY  1978   RIGHT SHOULDER PINNING  . HERNIA REPAIR     UMBILICAL 4680, GROIN FEB 1975  . I & D KNEE WITH POLY EXCHANGE Right 08/15/2020   Procedure: RIGHT UNI COMPARTMENTAL KNEE IRRIGATION AND DEBRIDEMENT KNEE WITH POLY EXCHANGE;  Surgeon: Paralee Cancel, MD;  Location: [facility];  Service: Orthopedics;  Laterality: Right;  . PARTIAL KNEE ARTHROPLASTY Right 07/29/2019   Procedure: UNICOMPARTMENTAL KNEE Medially;  Surgeon: Paralee Cancel, MD;  Location: [facility];  Service: Orthopedics;  Laterality: Right;  90 mins  . RIGHT SHOULDER PINS REMOVED         Family History  Problem Relation Age of Onset  . Hyperlipidemia Mother   . Mental illness Mother     Social History   Tobacco Use  . Smoking status: Never Smoker  . Smokeless tobacco: Never Used  Vaping Use  . Vaping Use: Never used  Substance Use Topics  . Alcohol use: No  . Drug use: No    Home Medications Prior to Admission medications   Medication Sig Start Date End Date Taking? Authorizing Provider  aspirin (ASPIRIN CHILDRENS) 81 MG chewable tablet Chew 1 tablet (81 mg total) by mouth 2 (two) times daily. Take for 4 weeks, then resume regular dose. 08/18/20 09/17/20 Yes Babish, Rodman Key, PA-C  ceFAZolin (ANCEF) IVPB Inject 2 g into the vein every 8 (eight) hours. Indication:  Prosthetic knee infection First Dose: no Last Day of Therapy:  09/26/2020 Labs - Once weekly:  CBC/D  and BMP, Labs - Every other week:  ESR and CRP Method of administration: IV Push Method of administration may be changed at the discretion of home infusion pharmacist based upon assessment of the patient and/or caregiver's ability to self-administer the medication ordered. 08/17/20 09/27/20 Yes Babish, Rodman Key, PA-C  docusate sodium (COLACE) 100 MG capsule Take 1 capsule (100 mg total) by mouth 2 (two) times daily. 08/17/20  Yes Babish, Rodman Key, PA-C  Flavoring Agent (GRAPEFRUIT FLAVOR) OIL Take 1 tablet by mouth daily.   Yes [provider]  Glucosamine-Chondroitin (OSTEO BI-FLEX REGULAR STRENGTH PO) Take 2 tablets by mouth daily.   Yes [provider]  HYDROcodone-acetaminophen (NORCO) 7.5-325 MG tablet Take 1-2 tablets by mouth every 4 (four) hours as needed for moderate pain. 08/17/20  Yes Babish, Rodman Key, PA-C  hydrOXYzine (ATARAX/VISTARIL) 25 MG tablet Take 1 tablet (25 mg total) by mouth every 6 (six) hours as needed for itching. 09/08/20  Yes Carlyle Basques, MD  Melatonin 5 MG CAPS Take 10 mg by mouth at bedtime.   Yes [provider]  metFORMIN (GLUCOPHAGE-XR) 500 MG 24 hr tablet Take 1 tablet (500 mg total) by mouth daily with supper. 07/12/20  Yes [name], MD  methocarbamol (ROBAXIN) 500 MG tablet Take 1 tablet (500 mg total) by mouth every 6 (six) hours as needed for muscle spasms. 08/17/20  Yes Babish, Rodman Key, PA-C  Omega-3 Fatty Acids (FISH OIL ULTRA) 1400 MG CAPS Take 1,400 mg by mouth daily.   Yes [provider]  OVER THE COUNTER MEDICATION Take 2 capsules by mouth daily. Cinsulin (Cinnamon Chromium Picolinate Vitamin D3)   Yes [provider]  polyethylene glycol (MIRALAX / GLYCOLAX) 17 g packet Take 17 g by mouth 2 (two) times daily. Patient taking differently: Take 17 g by mouth 2 (two) times daily as needed for moderate constipation.  08/17/20  Yes Babish, Rodman Key, PA-C  rifampin (RIFADIN) 300 MG capsule Take 1 capsule (300  mg total) by mouth 2 (two) times daily. 08/17/20 09/26/20 Yes Paralee Cancel, MD  TURMERIC PO Take 2,000 mg by mouth daily. 1000 MG/CAPSULE   Yes [provider]  ferrous sulfate (FERROUSUL) 325 (65 FE) MG tablet Take 1 tablet (325 mg total) by mouth 3 (three) times daily with meals for 14 days. Patient not taking: Reported on 09/09/2020 08/17/20 09/09/20  Danae Orleans, PA-C    Allergies    Benadryl [diphenhydramine]  Review of Systems   Review of Systems  Constitutional: Positive for chills, fatigue and fever.  HENT: Negative for congestion.   Eyes: Negative for pain.  Respiratory: Negative for cough and shortness of breath.   Cardiovascular: Negative for chest pain and leg swelling.  Gastrointestinal: Negative for abdominal pain, diarrhea and vomiting.  Genitourinary: Negative for dysuria.  Musculoskeletal: Positive for joint swelling (unchanged/improved from surgery) and myalgias.  Skin: Negative for rash.  Neurological: Negative for dizziness and headaches.  Physical Exam Updated Vital Signs BP 123/75 (BP Location: Left Arm)   Pulse 75   Temp 97.7 F (36.5 C) (Oral)   Resp 17   Ht _0  (1.727 m)   Wt 111.6 kg   SpO2 99%   BMI 37.40 kg/m   Physical Exam Vitals and nursing note reviewed.  Constitutional:      General: He is not in acute distress.    Comments: Obese pleasant 64 year old male somewhat fatigued appearing in no acute distress. Able answer questions appropriately, and follow commands. No increased work of breathing. Speaking full sentences.   HENT:     Head: Normocephalic and atraumatic.     Nose: Nose normal.     Mouth/Throat:     Mouth: Mucous membranes are moist.  Eyes:     General: No scleral icterus.    Conjunctiva/sclera: Conjunctivae normal.     Comments: No scleral icterus  Cardiovascular:     Rate and Rhythm: Normal rate and regular rhythm.     Pulses: Normal pulses.     Heart sounds: Normal heart sounds.  Pulmonary:      Effort: Pulmonary effort is normal. No respiratory distress.     Breath sounds: No wheezing.  Abdominal:     Palpations: Abdomen is soft.     Tenderness: There is no abdominal tenderness. There is no guarding or rebound.     Comments: Protuberant nontender abdomen  Musculoskeletal:     Cervical back: Normal range of motion and neck supple. No tenderness.     Right lower leg: No edema.     Left lower leg: No edema.     Comments: Full range of motion of right knee and no significant tenderness to palpation. There is a palpable effusion in the right knee however. Good pulses distally. Sensation is intact. No focal bony tenderness. Mild warmth to touch.  Skin:    General: Skin is warm and dry.     Capillary Refill: Capillary refill takes less than 2 seconds.     Comments: Diffuse rash to bilateral upper extremities and chest and right lower extremity see pictures below.  Skin is mildly warm to touch and somewhat flushed especially in the back and arms  PICC line in right upper arm with no evidence of infection no erythema or warmth to touch.  Neurological:     Mental Status: He is alert. Mental status is at baseline.  Psychiatric:        Mood and Affect: Mood normal.        Behavior: Behavior normal.               ED Results / Procedures / Treatments   Labs (all labs ordered are listed, but only abnormal results are displayed) Labs Reviewed  COMPREHENSIVE METABOLIC PANEL - Abnormal; Notable for the following components:      Result Value   Sodium 133 (*)    Chloride 97 (*)    Glucose, Bld 138 (*)    AST 272 (*)    ALT 232 (*)    Total Bilirubin 1.3 (*)    All other components within normal limits  CBC WITH DIFFERENTIAL/PLATELET - Abnormal; Notable for the following components:   Eosinophils Absolute 1.3 (*)    All other components within normal limits  URINALYSIS, ROUTINE W REFLEX MICROSCOPIC - Abnormal; Notable for the following components:   Color, Urine AMBER (*)     Protein, ur 100 (*)    All other components within normal  limits  C-REACTIVE PROTEIN - Abnormal; Notable for the following components:   CRP 6.2 (*)    All other components within normal limits  GLUCOSE, CAPILLARY - Abnormal; Notable for the following components:   Glucose-Capillary 125 (*)    All other components within normal limits  CK - Abnormal; Notable for the following components:   Total CK 38 (*)    All other components within normal limits  COMPREHENSIVE METABOLIC PANEL - Abnormal; Notable for the following components:   Glucose, Bld 129 (*)    BUN 25 (*)    Calcium 8.6 (*)    Total Protein 6.4 (*)    Albumin 2.9 (*)    AST 90 (*)    ALT 136 (*)    All other components within normal limits  GLUCOSE, CAPILLARY - Abnormal; Notable for the following components:   Glucose-Capillary 152 (*)    All other components within normal limits  GLUCOSE, CAPILLARY - Abnormal; Notable for the following components:   Glucose-Capillary 129 (*)    All other components within normal limits  GLUCOSE, CAPILLARY - Abnormal; Notable for the following components:   Glucose-Capillary 153 (*)    All other components within normal limits  GLUCOSE, CAPILLARY - Abnormal; Notable for the following components:   Glucose-Capillary 149 (*)    All other components within normal limits  CULTURE, BLOOD (ROUTINE X 2)  CULTURE, BLOOD (ROUTINE X 2)  RESPIRATORY PANEL BY RT PCR (FLU A&B, COVID)  CULTURE, BLOOD (SINGLE)  LACTIC ACID, PLASMA  LACTIC ACID, PLASMA  PROTIME-INR  SEDIMENTATION RATE  CBC  CBC WITH DIFFERENTIAL/PLATELET  BASIC METABOLIC PANEL    EKG None  Radiology DG Chest 2 View  Result Date: 09/09/2020 CLINICAL DATA:  Suspected sepsis. Possible PICC infection in the right arm. Temperature to 103.5 last night. Patient does not feel well. EXAM: CHEST - 2 VIEW COMPARISON:  None. FINDINGS: The heart size and mediastinal contours are within normal limits. Both lungs are clear. No  pleural effusion or pneumothorax. The visualized skeletal structures are intact. IMPRESSION: No active cardiopulmonary disease. Electronically Signed   By: [name] M.D.   On: 09/09/2020 10:47   DG Knee Complete 4 Views Right  Result Date: 09/09/2020 CLINICAL DATA:  Fever to 103.5 last night. Reported right knee surgery with incision and drainage on 11/16/2019. Partial right knee arthroplasty on 07/29/2019. EXAM: RIGHT KNEE - COMPLETE 4+ VIEW COMPARISON:  None. FINDINGS: No fracture.  No bone lesion. There is a line of lucency adjacent to the tibial component of the medial compartment hemiarthroplasty, which could reflect loosening or infection. The prosthetic components are well aligned. Mild lateral joint space compartment narrowing. Minor marginal spurring from the patella. Small to moderate suprapatellar joint effusion. There is significant anterior knee soft tissue swelling, anterior to the patella and quadriceps and patellar tendons. IMPRESSION: 1. Linear lucency adjacent to the tibial component of the medial compartment hemiarthroplasty. This could reflect loosening or infection. 2. Small to moderate joint effusion with anterior soft tissue swelling. 3. No fractures.  No other acute finding. Electronically Signed   By: [name] M.D.   On: 09/09/2020 10:51    Procedures Procedures (including critical care time)  Medications Ordered in ED Medications  methocarbamol (ROBAXIN) tablet 500 mg (has no administration in time range)  polyethylene glycol (MIRALAX / GLYCOLAX) packet 17 g (has no administration in time range)  enoxaparin (LOVENOX) injection 40 mg (40 mg Subcutaneous Given 09/10/20 1635)  ketorolac (TORADOL) 15 MG/ML injection  15 mg (15 mg Intravenous Given 09/10/20 0458)  diphenhydrAMINE (BENADRYL) injection 25 mg (has no administration in time range)  Chlorhexidine Gluconate Cloth 2 % PADS 6 each (has no administration in time range)  insulin aspart (novoLOG) injection 0-9  Units (1 Units Subcutaneous Given 09/10/20 1636)  sodium chloride flush (NS) 0.9 % injection 10-40 mL (has no administration in time range)  DAPTOmycin (CUBICIN) 700 mg in sodium chloride 0.9 % IVPB (has no administration in time range)  methylPREDNISolone sodium succinate (SOLU-MEDROL) 125 mg/2 mL injection 125 mg (125 mg Intravenous Given 09/09/20 1120)  famotidine (PEPCID) IVPB 20 mg premix (0 mg Intravenous Stopped 09/09/20 1147)  diphenhydrAMINE (BENADRYL) injection 12.5 mg (12.5 mg Intravenous Given 09/09/20 1120)  ketorolac (TORADOL) 15 MG/ML injection 30 mg (30 mg Intravenous Given 09/09/20 1321)    ED Course  I have reviewed the triage vital signs and the nursing notes.  Pertinent labs & imaging results that were available during my care of the patient were reviewed by me and considered in my medical decision making (see chart for details).  Patient is 65 year old male seen by myself and Dr. Eulis Foster. He is brought into the emergency department today by family after he called his infectious disease doctor for his symptoms described in HPI.  Physical exam is notable for diffuse rash. He is not tachycardic nor does he have a leukocytosis his vital signs are within normal limits he is somewhat fatigued appearing but otherwise in no acute distress. I have low suspicion for bacteremia, endocarditis, sepsis. Because of the temporal relationship with his antibiotic infusions have a higher suspicion for antibiotic reaction. He did communicate to attending physician that recently started a new box of antibiotics no suppression of lab error contaminant/some substances in his antibiotic patch could be causing his reaction.  Blood cultures obtained including a single blood culture from the PICC line. A Covid panel negative. Sed rate negative. Lactic negative.  CMP without leukocytosis. Mild hyponatremia and there is associated AST elevation that is symmetric.He has not been taking any recent Tylenol no  significant alcohol intake.   CBC without leukocytosis or anemia. CRP is 6.2. Urinalysis without evidence of infection.  X-ray of chest is without any acute abnormality. Agree with radiology read. Clinical Course as of Sep 11 1755  Sat Sep 09, 2020  1052 AST and ALT symmetrically elevated. Last comparison 1 year ago WNLs.   Comprehensive metabolic panel(!) [WF]  9166 DG knee reviewed.  1. Linear lucency adjacent to the tibial component of the medial compartment hemiarthroplasty. This could reflect loosening or infection. 2. Small to moderate joint effusion with anterior soft tissue swelling. 3. No fractures. No other acute finding.   [WF]  1205 Discussed with Dr. [name] of internal medicine who will admit.   [WF]  1305 Discussed with orthopedics. No recommnedations. Dr. Delfino Lovett reviewed images. Will be available for consultation   [WF]    Clinical Course User Index [WF] Tedd Sias, [place]   MDM Rules/Calculators/A&P                          Patient noted to medicine. Consults with infectious disease and orthopedics have been called and discussed they are aware of patient's hospital stay. [name] will assess patient at bedside and make further recommendations.  Final Clinical Impression(s) / ED Diagnoses Final diagnoses:  Fever, unspecified fever cause    Rx / DC Orders ED Discharge Orders  Ordered    Home infusion instructions - [facility]        Pending    Method of administration may be changed at the discretion of home infusion pharmacist based upon assessment of the patient and/or caregiver's ability to self-administer the medication ordered        Pending    Anaphylaxis Kit: Provided to treat any anaphylactic reaction to the medication being provided to the patient if First Dose or when requested by physician        Pending    Change dressing on IV access line weekly and PRN        Pending    Flush IV access with Sodium Chloride 0.9% and  Heparin 10 units/ml or 100 units/ml        Pending    [facility] to provide Cath Flo 72m       Comments: Administer for PICC line occlusion and as ordered by physician for other access device issues.   Pending    [facility] pharmacist to adjust dose for Vancomycin, Aminoglycosides and other anti-infective therapies as requested by physician.        Pending    daptomycin (CUBICIN) IVPB  Every 24 hours        Pending           FTedd Sias PUtah10/31/21 1757    WDaleen Bo MD 09/11/20 [id number]

## 2020-09-09 NOTE — Progress Notes (Signed)
Pt stable at time of bedside rounding. No needs at this time. Pt denies pain. Family at bedside. Rn will continue to  Monitor.

## 2020-09-09 NOTE — H&P (Addendum)
History and Physical        Hospital Admission Note Date: 09/09/2020  Patient name: Jesus Mayhall Sr. Medical record number: [id number] Date of birth: 29-Mar-1956 Age: 64 y.o. Gender: male  PCP: [name], MD  Patient coming from: Home Lives with: Wife At baseline, ambulates: Currently crutches post surgery, typically independent  Chief Complaint    Chief Complaint  Patient presents with  . PICC Line Infection      HPI:    This is a 63 year old male with a past medical history of type 2 diabetes, OSA, right medial unicompartmental knee replacement (UKR) which was complicated by anterior knee erythema and treated with a course of cephalexin September 2020 and recent hospitalization from 10/4-10/7 for MSSA right knee joint infection s/p right UKR irrigation and debridement (08/15/20) and discharged on Cefazolin and Rifampin per PICC line who began having nausea, paresthesias, pruritis and fever (103.69F) on 09/08/20 after taking 2 doses of his Cefazolin.  Per patient, he was in his usual state of health and recovering at home after his recent surgery when he was to begin his a.m. dose of cefazolin yesterday.  Shortly after beginning his infusion he began having symptoms stated above without fever at the time.  This lasted about 2 hours.  He then took an [place] prior to his afternoon infusion and had recurrence of the symptoms plus fever up to 103.5.  Contacted his ID doctor who recommended hydroxyzine every 6 hours as needed for itching.  Patient skipped his evening dose for fear of having recurrence of this reaction.  When he awoke this morning he was feeling a bit better and took his a.m. dose of cefazolin with recurrence of symptoms. He reported the symptoms to the on-call ID physician, [name] who was concern for a PICC line infection given his high fevers and recommended  that he come to the ED. Reported these 3 doses were from a new box of antibiotics that he received and also stated that he has been also taking grapefruit seed oil while on the rifampin and cefazolin antibiotics.  Took rifampin yesterday but did not have his dose yet today.  ED Course: Afebrile, hemodynamically stable on room air. Notable labs: Na 133, K 6.1, AST 272, ALT 232, T bili 1.3, WBC 5.7, lactic acid 1.1, UA negative. CXR unremarkable. Right knee xray:  Small to moderate joint effusion with anterior soft tissue swelling and possibly loosening or infection of the joint. ID and Ortho were consulted. ID initially recommended changing antibiotics to Daptomycin and Cefepime.    Vitals:   09/09/20 1009 09/09/20 1306  BP: 136/72 (!) 143/77  Pulse: 80 85  Resp: 18 16  Temp:    SpO2: 100% 96%     Review of Systems:  Review of Systems  Constitutional: Positive for malaise/fatigue.  HENT:       Denying throat closure  Respiratory: Negative for shortness of breath and wheezing.   Skin: Positive for rash.  All other systems reviewed and are negative.   Medical/Social/Family History   Past Medical History: Past Medical History:  Diagnosis Date  . Arthritis    OA  . Diabetes mellitus without complication ([place])    TYPE 2  .  History of colon polyps 2015  . Lumbar stress fracture AGE 43   NO SX DONE, HEALED  . 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 He currently does not take nothing for it. He declines meds and is tired.     Past Surgical History:  Procedure Laterality Date  . FRACTURE SURGERY  1978   RIGHT SHOULDER PINNING  . HERNIA REPAIR     UMBILICAL 2017, GROIN FEB 1975  . I & D KNEE WITH POLY EXCHANGE Right 08/15/2020   Procedure: RIGHT UNI COMPARTMENTAL KNEE IRRIGATION AND DEBRIDEMENT KNEE WITH POLY EXCHANGE;  Surgeon: [name], MD;  Location: [facility];  Service: Orthopedics;  Laterality: Right;  . PARTIAL KNEE ARTHROPLASTY Right 07/29/2019   Procedure:  UNICOMPARTMENTAL KNEE Medially;  Surgeon: [name], MD;  Location: [facility];  Service: Orthopedics;  Laterality: Right;  90 mins  . RIGHT SHOULDER PINS REMOVED      Medications: Prior to Admission medications   Medication Sig Start Date End Date Taking? Authorizing Provider  aspirin (ASPIRIN CHILDRENS) 81 MG chewable tablet Chew 1 tablet (81 mg total) by mouth 2 (two) times daily. Take for 4 weeks, then resume regular dose. 08/18/20 09/17/20  Lanney Gins, PA-C  ceFAZolin (ANCEF) IVPB Inject 2 g into the vein every 8 (eight) hours. Indication:  Prosthetic knee infection First Dose: no Last Day of Therapy:  09/26/2020 Labs - Once weekly:  CBC/D and BMP, Labs - Every other week:  ESR and CRP Method of administration: IV Push Method of administration may be changed at the discretion of home infusion pharmacist based upon assessment of the patient and/or caregiver's ability to self-administer the medication ordered. 08/17/20 09/27/20  Lanney Gins, PA-C  docusate sodium (COLACE) 100 MG capsule Take 1 capsule (100 mg total) by mouth 2 (two) times daily. 08/17/20   Lanney Gins, PA-C  ferrous sulfate (FERROUSUL) 325 (65 FE) MG tablet Take 1 tablet (325 mg total) by mouth 3 (three) times daily with meals for 14 days. 08/17/20 08/31/20  Lanney Gins, PA-C  Glucosamine-Chondroitin (OSTEO BI-FLEX REGULAR STRENGTH PO) Take 2 tablets by mouth daily.    [provider]  HYDROcodone-acetaminophen (NORCO) 7.5-325 MG tablet Take 1-2 tablets by mouth every 4 (four) hours as needed for moderate pain. 08/17/20   Lanney Gins, PA-C  hydrOXYzine (ATARAX/VISTARIL) 25 MG tablet Take 1 tablet (25 mg total) by mouth every 6 (six) hours as needed for itching. 09/08/20   Judyann Munson, MD  Melatonin 5 MG CAPS Take 10 mg by mouth at bedtime.    [provider]  metFORMIN (GLUCOPHAGE-XR) 500 MG 24 hr tablet Take 1 tablet (500 mg total) by mouth daily with supper. 07/12/20   Mliss Sax, MD  methocarbamol (ROBAXIN) 500 MG tablet Take 1 tablet (500 mg total) by mouth every 6 (six) hours as needed for muscle spasms. 08/17/20   Lanney Gins, PA-C  Omega-3 Fatty Acids (FISH OIL ULTRA) 1400 MG CAPS Take 1,400 mg by mouth daily.    [provider]  OVER THE COUNTER MEDICATION Take 2 capsules by mouth daily. Cinsulin (Cinnamon Chromium Picolinate Vitamin D3)    [provider]  polyethylene glycol (MIRALAX / GLYCOLAX) 17 g packet Take 17 g by mouth 2 (two) times daily. 08/17/20   Lanney Gins, PA-C  rifampin (RIFADIN) 300 MG capsule Take 1 capsule (300 mg total) by mouth 2 (two) times daily. 08/17/20 09/26/20  [name], MD  TURMERIC PO Take 2,000 mg by  mouth daily. 1000 MG/CAPSULE    [provider]    Allergies:   Allergies  Allergen Reactions  . Benadryl [Diphenhydramine] Other (See Comments)    He cannot recall what happened. Maybe "took too much."    Social History:  reports that he has never smoked. He has never used smokeless tobacco. He reports that he does not drink alcohol and does not use drugs.  Family History: Family History  Problem Relation Age of Onset  . Hyperlipidemia Mother   . Mental illness Mother      Objective   Physical Exam: Blood pressure (!) 143/77, pulse 85, temperature 97.7 F (36.5 C), temperature source Oral, resp. rate 16, height $RemoveBe'5\' 8"'rXYBjemNd$  (1.727 m), weight 111.6 kg, SpO2 96 %.  Physical Exam Vitals and nursing note reviewed.  Constitutional:      Appearance: Normal appearance.  HENT:     Head: Normocephalic and atraumatic.  Eyes:     Conjunctiva/sclera: Conjunctivae normal.  Cardiovascular:     Rate and Rhythm: Normal rate and regular rhythm.  Pulmonary:     Effort: Pulmonary effort is normal.     Breath sounds: Normal breath sounds.  Abdominal:     General: Abdomen is flat.     Palpations: Abdomen is soft.  Musculoskeletal:     Comments: Right knee postoperative changes without  erythema or purulent drainage RUE PICC line without sign of infection  Skin:    Findings: Rash present.     Comments: See picture below  Neurological:     Mental Status: He is alert. Mental status is at baseline.  Psychiatric:        Mood and Affect: Mood normal.        Behavior: Behavior normal.           LABS on Admission: I have personally reviewed all the labs and imaging below    Basic Metabolic Panel: Recent Labs  Lab 09/09/20 0939  NA 133*  K 5.1  CL 97*  CO2 27  GLUCOSE 138*  BUN 22  CREATININE 1.11  CALCIUM 8.9   Liver Function Tests: Recent Labs  Lab 09/09/20 0939  AST 272*  ALT 232*  ALKPHOS 76  BILITOT 1.3*  PROT 7.7  ALBUMIN 3.5   No results for input(s): LIPASE, AMYLASE in the last 168 hours. No results for input(s): AMMONIA in the last 168 hours. CBC: Recent Labs  Lab 09/09/20 0939  WBC 5.7  NEUTROABS 3.2  HGB 14.4  HCT 43.9  MCV 89.6  PLT 168   Cardiac Enzymes: No results for input(s): CKTOTAL, CKMB, CKMBINDEX, TROPONINI in the last 168 hours. BNP: Invalid input(s): POCBNP CBG: No results for input(s): GLUCAP in the last 168 hours.  Radiological Exams on Admission:  DG Chest 2 View  Result Date: 09/09/2020 CLINICAL DATA:  Suspected sepsis. Possible PICC infection in the right arm. Temperature to 103.5 last night. Patient does not feel well. EXAM: CHEST - 2 VIEW COMPARISON:  None. FINDINGS: The heart size and mediastinal contours are within normal limits. Both lungs are clear. No pleural effusion or pneumothorax. The visualized skeletal structures are intact. IMPRESSION: No active cardiopulmonary disease. Electronically Signed   By: [name] M.D.   On: 09/09/2020 10:47   DG Knee Complete 4 Views Right  Result Date: 09/09/2020 CLINICAL DATA:  Fever to 103.5 last night. Reported right knee surgery with incision and drainage on 11/16/2019. Partial right knee arthroplasty on 07/29/2019. EXAM: RIGHT KNEE - COMPLETE 4+ VIEW  COMPARISON:  None. FINDINGS: No fracture.  No bone lesion. There is a line of lucency adjacent to the tibial component of the medial compartment hemiarthroplasty, which could reflect loosening or infection. The prosthetic components are well aligned. Mild lateral joint space compartment narrowing. Minor marginal spurring from the patella. Small to moderate suprapatellar joint effusion. There is significant anterior knee soft tissue swelling, anterior to the patella and quadriceps and patellar tendons. IMPRESSION: 1. Linear lucency adjacent to the tibial component of the medial compartment hemiarthroplasty. This could reflect loosening or infection. 2. Small to moderate joint effusion with anterior soft tissue swelling. 3. No fractures.  No other acute finding. Electronically Signed   By: [name] M.D.   On: 09/09/2020 10:51      EKG: Not done in the ED   A & P   Principal Problem:   Adverse reaction to antibiotic, initial encounter Active Problems:   Drug-induced liver injury   Prediabetes   1. Suspected adverse/allergic reaction to Cefazolin and/or Rifampin a. Hemodynamically stable on room air, not in anaphylaxis b. Not likely a PICC line infection c. Follow up blood cultures d. Will hold cefazolin and rifampin for now e. Advised against using grapefruit seed oil as this can alter the metabolism of these antibiotics f. Benadryl PRN g. Cubicin ordered by ID h. Appreciate further ID recommendations   2. Drug Induced Liver Injury a. Holding tylenol b. Trend LFTs, otherwise plan as above  3. Prediabetes a. Holding home metformin b. Carb modified diet    DVT prophylaxis: lovenox   Code Status: Prior  Diet: heart healthy/Carb modified Family Communication: Admission, patients condition and plan of care including tests being ordered have been discussed with the patient who indicates understanding and agrees with the plan and Code Status. Patient's wife was updated   Disposition Plan: The appropriate patient status for this patient is OBSERVATION. Observation status is judged to be reasonable and necessary in order to provide the required intensity of service to ensure the patient's safety. The patient's presenting symptoms, physical exam findings, and initial radiographic and laboratory data in the context of their medical condition is felt to place them at decreased risk for further clinical deterioration. Furthermore, it is anticipated that the patient will be medically stable for discharge from the hospital within 2 midnights of admission. The following factors support the patient status of observation.   " The patient's presenting symptoms include rash, pruritis, nausea, fever. " The physical exam findings include rash. " The initial radiographic and laboratory data are concerning for drug induced liver injury.   Consultants  . ID . EDP to discuss with ortho  Procedures  . none  Time Spent on Admission: 67 minutes    Harold Hedge, DO [facility]  09/09/2020, 1:22 PM

## 2020-09-09 NOTE — Progress Notes (Signed)
      INFECTIOUS DISEASE ATTENDING ADDENDUM:   Date: 09/09/2020  Patient name: Jesus Alsip Sr.  Medical record number: [id number]  Date of birth: 10/05/1956   I was called this am by patient.  Sounds like he has PICC line infection with fevers, systemic symptoms with infusion of antibiotics but also in time period therafter  I worry he could be bacteremic  I would recommend send blood cultures, get PIV, DC PICC line and broaden to cubicin and cefepime    Paulette Blanch [place] 09/09/2020, 11:01 AM

## 2020-09-09 NOTE — Plan of Care (Signed)

## 2020-09-09 NOTE — Plan of Care (Signed)
  Problem: Skin Integrity: Goal: Risk for impaired skin integrity will decrease Outcome: Progressing   Problem: Safety: Goal: Ability to remain free from injury will improve Outcome: Progressing   Problem: Pain Managment: Goal: General experience of comfort will improve Outcome: Progressing   

## 2020-09-09 NOTE — Consult Note (Signed)
Date of Admission:  09/09/2020          Reason for Consult: Drug rash, septic pji    Referring Provider: Whitney Post, MD   Assessment:  1. Drug rash likely from beta lactam vs rifampin 2. LIkely drug induced liver injury 3. Septic Prosthetic right knee sp polyexchange 4.   Plan:  1. Agree stop ancef and rifampin 2. Start cubicin 3. Blood cultures already taken  Principal Problem:   Adverse reaction to antibiotic, initial encounter Active Problems:   Drug-induced liver injury   Prediabetes   Scheduled Meds: . [START ON 09/10/2020] Chlorhexidine Gluconate Cloth  6 each Topical Q0600  . enoxaparin (LOVENOX) injection  40 mg Subcutaneous Q24H   Continuous Infusions: . DAPTOmycin (CUBICIN)  IV Stopped (09/09/20 1501)   PRN Meds:.diphenhydrAMINE, ketorolac, methocarbamol, polyethylene glycol  HPI: Jesus Nunez. is a 64 y.o. male  medical history of type 2 diabetes, OSA, right medial unicompartmental knee replacement (UKR) which was complicated by anterior knee erythema and treated with a course of cephalexin September 2020 and recent hospitalization from 10/4-10/7 for MSSA right knee joint infection s/p right UKR irrigation and debridement (08/15/20) and poly-exhange by Dr Charlann Boxer . He was initially on vancomycin but changed to Cefazolin and Rifampin per PICC line who began having nausea, paresthesias, pruritis and fever (103.61F) on 09/08/20 after taking 2 doses of his Cefazolin.    Shortly after beginning his infusion he began having symptoms stated above without fever at the time.  This lasted about 2 hours. He then took an [place] prior to his afternoon infusion and had recurrence of the symptoms plus fever up to 103.5.  Contacted his ID doctor who recommended hydroxyzine every 6 hours as needed for itching.  Patient skipped his evening dose for fear of having recurrence of this reaction.  When he awoke this morning he was feeling a bit better and took his a.m. dose of cefazolin  with recurrence of symptoms.   I was called by the patient and his wife and I was concerned for PICC Line infection +/- bacteremia and advised him to go to ER.  IN interim he had developed a macular rash with some petechIal aspects on legs, arms, chest and back.  BLood cultures taken in ER and PICC examined and not overtly infected.  His labs show transaminases up with bilirubin. HE received steroids, benadryl in ER as well.  Based on his rash I would be more concerned re beta lactam rash esp with relationship to infusions. His hepatitis could be related or separate process related to his rifampin. He also has been taking grapefruit extract which I have advised against.    Review of Systems: Review of Systems  Constitutional: Positive for fever and malaise/fatigue. Negative for chills, diaphoresis and weight loss.  HENT: Negative for congestion, hearing loss, sore throat and tinnitus.   Eyes: Negative for blurred vision and double vision.  Respiratory: Negative for cough, sputum production, shortness of breath and wheezing.   Cardiovascular: Negative for chest pain, palpitations and leg swelling.  Gastrointestinal: Negative for abdominal pain, blood in stool, constipation, diarrhea, heartburn, melena, nausea and vomiting.  Genitourinary: Negative for dysuria, flank pain and hematuria.  Musculoskeletal: Positive for myalgias. Negative for back pain, falls and joint pain.  Skin: Positive for itching and rash.  Neurological: Negative for dizziness, sensory change, focal weakness, loss of consciousness, weakness and headaches.  Endo/Heme/Allergies: Does not bruise/bleed easily.  Psychiatric/Behavioral: Negative for depression, memory loss and suicidal  ideas. The patient is not nervous/anxious.     Past Medical History:  Diagnosis Date  . Arthritis    OA  . Diabetes mellitus without complication (HCC)    TYPE 2  . History of colon polyps 2015  . Lumbar stress fracture AGE 52   NO SX  DONE, HEALED  . 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 He currently does not take nothing for it. He declines meds and is tired.     Social History   Tobacco Use  . Smoking status: Never Smoker  . Smokeless tobacco: Never Used  Vaping Use  . Vaping Use: Never used  Substance Use Topics  . Alcohol use: No  . Drug use: No    Family History  Problem Relation Age of Onset  . Hyperlipidemia Mother   . Mental illness Mother    Allergies  Allergen Reactions  . Benadryl [Diphenhydramine] Other (See Comments)    He cannot recall what happened. Maybe "took too much."    OBJECTIVE: Blood pressure 114/70, pulse 81, temperature 97.7 F (36.5 C), temperature source Oral, resp. rate (!) 22, height 5\' 8"  (1.727 m), weight 111.6 kg, SpO2 94 %.  Physical Exam Constitutional:      General: He is not in acute distress.    Appearance: Normal appearance. He is well-developed. He is not ill-appearing or diaphoretic.  HENT:     Head: Normocephalic and atraumatic.     Right Ear: Hearing and external ear normal.     Left Ear: Hearing and external ear normal.     Nose: No nasal deformity or rhinorrhea.  Eyes:     General: No scleral icterus.    Conjunctiva/sclera: Conjunctivae normal.     Right eye: Right conjunctiva is not injected.     Left eye: Left conjunctiva is not injected.  Neck:     Vascular: No JVD.  Cardiovascular:     Rate and Rhythm: Normal rate and regular rhythm.     Heart sounds: Normal heart sounds, S1 normal and S2 normal.  Abdominal:     General: There is no distension.     Palpations: Abdomen is soft.     Tenderness: There is no abdominal tenderness.  Musculoskeletal:        General: Normal range of motion.     Right shoulder: Normal.     Left shoulder: Normal.     Cervical back: Normal range of motion and neck supple.     Right hip: Normal.     Left hip: Normal.     Right knee: Normal.     Left knee: Normal.  Lymphadenopathy:     Head:      Right side of head: No submandibular, preauricular or posterior auricular adenopathy.     Left side of head: No submandibular, preauricular or posterior auricular adenopathy.     Cervical: No cervical adenopathy.     Right cervical: No superficial or deep cervical adenopathy.    Left cervical: No superficial or deep cervical adenopathy.  Skin:    General: Skin is warm and dry.     Coloration: Skin is not pale.     Findings: No abrasion, bruising, ecchymosis, erythema, lesion or rash.     Nails: There is no clubbing.  Neurological:     Mental Status: He is alert and oriented to person, place, and time. Mental status is at baseline.     Sensory: No sensory deficit.     Coordination:  Coordination normal.     Gait: Gait normal.  Psychiatric:        Attention and Perception: He is attentive.        Mood and Affect: Mood normal.        Speech: Speech normal.        Behavior: Behavior normal. Behavior is cooperative.        Thought Content: Thought content normal.        Judgment: Judgment normal.    PICC line 09/09/2020:     Rash 09/09/30:              Lab Results Lab Results  Component Value Date   WBC 5.7 09/09/2020   HGB 14.4 09/09/2020   HCT 43.9 09/09/2020   MCV 89.6 09/09/2020   PLT 168 09/09/2020    Lab Results  Component Value Date   CREATININE 1.11 09/09/2020   BUN 22 09/09/2020   NA 133 (L) 09/09/2020   K 5.1 09/09/2020   CL 97 (L) 09/09/2020   CO2 27 09/09/2020    Lab Results  Component Value Date   ALT 232 (H) 09/09/2020   AST 272 (H) 09/09/2020   ALKPHOS 76 09/09/2020   BILITOT 1.3 (H) 09/09/2020     Microbiology: Recent Results (from the past 240 hour(s))  Respiratory Panel by RT PCR (Flu A&B, Covid) - Nasopharyngeal Swab     Status: None   Collection Time: 09/09/20 11:34 AM   Specimen: Nasopharyngeal Swab  Result Value Ref Range Status   SARS Coronavirus 2 by RT PCR NEGATIVE NEGATIVE Final    Comment: (NOTE) SARS-CoV-2 target  nucleic acids are NOT DETECTED.  The SARS-CoV-2 RNA is generally detectable in upper respiratoy specimens during the acute phase of infection. The lowest concentration of SARS-CoV-2 viral copies this assay can detect is 131 copies/mL. A negative result does not preclude SARS-Cov-2 infection and should not be used as the sole basis for treatment or other patient management decisions. A negative result may occur with  improper specimen collection/handling, submission of specimen other than nasopharyngeal swab, presence of viral mutation(s) within the areas targeted by this assay, and inadequate number of viral copies (<131 copies/mL). A negative result must be combined with clinical observations, patient history, and epidemiological information. The expected result is Negative.  Fact Sheet for Patients:  https://www.moore.com/  Fact Sheet for Healthcare Providers:  https://www.young.biz/  This test is no t yet approved or cleared by the [place] FDA and  has been authorized for detection and/or diagnosis of SARS-CoV-2 by FDA under an Emergency Use Authorization (EUA). This EUA will remain  in effect (meaning this test can be used) for the duration of the COVID-19 declaration under Section 564(b)(1) of the Act, 21 U.S.C. section 360bbb-3(b)(1), unless the authorization is terminated or revoked sooner.     Influenza A by PCR NEGATIVE NEGATIVE Final   Influenza B by PCR NEGATIVE NEGATIVE Final    Comment: (NOTE) The Xpert Xpress SARS-CoV-2/FLU/RSV assay is intended as an aid in  the diagnosis of influenza from Nasopharyngeal swab specimens and  should not be used as a sole basis for treatment. Nasal washings and  aspirates are unacceptable for Xpert Xpress SARS-CoV-2/FLU/RSV  testing.  Fact Sheet for Patients: https://www.moore.com/  Fact Sheet for Healthcare  Providers: https://www.young.biz/  This test is not yet approved or cleared by the [place] FDA and  has been authorized for detection and/or diagnosis of SARS-CoV-2 by  FDA under an Emergency Use Authorization (EUA). This  EUA will remain  in effect (meaning this test can be used) for the duration of the  Covid-19 declaration under Section 564(b)(1) of the Act, 21  U.S.C. section 360bbb-3(b)(1), unless the authorization is  terminated or revoked. Performed at [facility], [address], [place], [place] [address]     Acey Lav, MD [facility] [phone] pager  09/09/2020, 7:03 PM

## 2020-09-09 NOTE — ED Triage Notes (Addendum)
Pt states he was sent due to ? PICC line infection in rt arm. Reports fever of 103.5 last night, states he does not feel well. Pt confused in conversation.

## 2020-09-09 NOTE — ED Notes (Signed)
Called report to Emily

## 2020-09-09 NOTE — Progress Notes (Signed)
Pharmacy Antibiotic Note  Jesus Luffman Sr. is a 64 y.o. male admitted on 09/09/2020 with probable bacteremia.  Pharmacy has been consulted for Daptomycin dosing.  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 ID MD contacted 10/29 with adverse rx, prescribed Hydroxyzine for possible allergic rx.  To ED 10/30 febrile, ID consulted > recommend Daptomycin, and Cefepime  Plan: Daptomycin 900mg  IVPB q24 CK ordered weekly starting 10/31 Cefepime not yet ordered Discontinue PICC line, use peripheral IV for antibiotics, blood cx obtained  Height: 5\' 8"  (172.7 cm) Weight: 111.6 kg (246 lb) IBW/kg (Calculated) : 68.4  Temp (24hrs), Avg:97.7 F (36.5 C), Min:97.7 F (36.5 C), Max:97.7 F (36.5 C)  Recent Labs  Lab 09/09/20 0937 09/09/20 0939 09/09/20 1128  WBC  --  5.7  --   CREATININE  --  1.11  --   LATICACIDVEN 1.1  --  1.1    Estimated Creatinine Clearance: 82.6 mL/min (by C-G formula based on SCr of 1.11 mg/dL).    Allergies  Allergen Reactions  . Benadryl [Diphenhydramine] Other (See Comments)    He cannot recall what happened. Maybe "took too much."    Antimicrobials this admission: 10/30 Daptomycin >>   Dose adjustments this admission:  Microbiology results: 10/30 BCx: sent  Prev Cx 10/5 R knee synovial fluid: MSSA (Gent resistant) otherwise pan-sens  Thank you for allowing pharmacy to be a part of this patient's care.  11/30 PharmD [facility] [phone] 09/09/2020 11:56 AM

## 2020-09-10 DIAGNOSIS — R7303 Prediabetes: Secondary | ICD-10-CM | POA: Diagnosis not present

## 2020-09-10 DIAGNOSIS — K719 Toxic liver disease, unspecified: Secondary | ICD-10-CM | POA: Diagnosis not present

## 2020-09-10 DIAGNOSIS — T3695XA Adverse effect of unspecified systemic antibiotic, initial encounter: Secondary | ICD-10-CM | POA: Diagnosis not present

## 2020-09-10 DIAGNOSIS — G4733 Obstructive sleep apnea (adult) (pediatric): Secondary | ICD-10-CM

## 2020-09-10 LAB — COMPREHENSIVE METABOLIC PANEL
ALT: 136 U/L — ABNORMAL HIGH (ref 0–44)
AST: 90 U/L — ABNORMAL HIGH (ref 15–41)
Albumin: 2.9 g/dL — ABNORMAL LOW (ref 3.5–5.0)
Alkaline Phosphatase: 60 U/L (ref 38–126)
Anion gap: 8 (ref 5–15)
BUN: 25 mg/dL — ABNORMAL HIGH (ref 8–23)
CO2: 25 mmol/L (ref 22–32)
Calcium: 8.6 mg/dL — ABNORMAL LOW (ref 8.9–10.3)
Chloride: 103 mmol/L (ref 98–111)
Creatinine, Ser: 0.88 mg/dL (ref 0.61–1.24)
GFR, Estimated: >60 mL/min (ref >60)
Glucose, Bld: 129 mg/dL — ABNORMAL HIGH (ref 70–99)
Potassium: 4.1 mmol/L (ref 3.5–5.1)
Sodium: 136 mmol/L (ref 135–145)
Total Bilirubin: 0.3 mg/dL (ref 0.3–1.2)
Total Protein: 6.4 g/dL — ABNORMAL LOW (ref 6.5–8.1)

## 2020-09-10 LAB — CBC
HCT: 40 % (ref 39.0–52.0)
Hemoglobin: 13 g/dL (ref 13.0–17.0)
MCH: 29.3 pg (ref 26.0–34.0)
MCHC: 32.5 g/dL (ref 30.0–36.0)
MCV: 90.1 fL (ref 80.0–100.0)
Platelets: 161 10*3/uL (ref 150–400)
RBC: 4.44 MIL/uL (ref 4.22–5.81)
RDW: 13.3 % (ref 11.5–15.5)
WBC: 5.2 10*3/uL (ref 4.0–10.5)
nRBC: 0 % (ref 0.0–0.2)

## 2020-09-10 LAB — GLUCOSE, CAPILLARY
Glucose-Capillary: 129 mg/dL — ABNORMAL HIGH (ref 70–99)
Glucose-Capillary: 153 mg/dL — ABNORMAL HIGH (ref 70–99)
Glucose-Capillary: 149 mg/dL — ABNORMAL HIGH (ref 70–99)
Glucose-Capillary: 105 mg/dL — ABNORMAL HIGH (ref 70–99)

## 2020-09-10 LAB — CK: Total CK: 38 U/L — ABNORMAL LOW (ref 49–397)

## 2020-09-10 MED ORDER — SODIUM CHLORIDE 0.9% FLUSH
10.0000 mL | INTRAVENOUS | Status: DC | PRN
  Administered 2020-09-11: 10 mL

## 2020-09-10 MED ORDER — INSULIN ASPART 100 UNIT/ML ~~LOC~~ SOLN
0.0000 [IU] | Freq: Three times a day (TID) | SUBCUTANEOUS | Status: DC
  Administered 2020-09-10: 1 [IU] via SUBCUTANEOUS

## 2020-09-10 MED ORDER — SODIUM CHLORIDE 0.9 % IV SOLN
700.0000 mg | Freq: Every day | INTRAVENOUS | Status: DC
  Administered 2020-09-10 – 2020-09-11 (×2): 700 mg via INTRAVENOUS
  Filled 2020-09-10 (×2): qty 14

## 2020-09-10 NOTE — Progress Notes (Signed)
Pt adamantly denies history of OSA and is upset that it is even listed in his medical hx.  Pt has refused CPAP QHS, RN aware.  RT to monitor and assess as needed.

## 2020-09-10 NOTE — Progress Notes (Addendum)
Requested to assess PICC line.  CXR appears to have PICC line in RIJ.  Also noted to be pulled out to 3-4 cm per photo in Dr Zenaida Niece Dam's progress note- was left at 0 on insertion.  Spoke with Dr Daiva Eves, new orders for PICC exchange for home ABT. Also states he does not think the PICC is a source of infection and plans to leave it for approximately 2 more weeks.  Recommend current placement use only for labs if blood return present and NS flushes until exchange can be done.

## 2020-09-10 NOTE — Plan of Care (Signed)
  Problem: Clinical Measurements: Goal: Respiratory complications will improve Outcome: Progressing   Problem: Clinical Measurements: Goal: Cardiovascular complication will be avoided Outcome: Progressing   Problem: Coping: Goal: Level of anxiety will decrease Outcome: Progressing   Problem: Elimination: Goal: Will not experience complications related to bowel motility Outcome: Progressing   Problem: Pain Managment: Goal: General experience of comfort will improve Outcome: Progressing   

## 2020-09-10 NOTE — Progress Notes (Signed)
Subjective: No new complaints   Antibiotics:  Anti-infectives (From admission, onward)   Start     Dose/Rate Route Frequency Ordered Stop   09/10/20 2000  DAPTOmycin (CUBICIN) 700 mg in sodium chloride 0.9 % IVPB        700 mg 228 mL/hr over 30 Minutes Intravenous Daily 09/10/20 1630     09/09/20 1300  DAPTOmycin (CUBICIN) 900 mg in sodium chloride 0.9 % IVPB  Status:  Discontinued        900 mg 236 mL/hr over 30 Minutes Intravenous Daily 09/09/20 1150 09/10/20 1630      Medications: Scheduled Meds: . Chlorhexidine Gluconate Cloth  6 each Topical Q0600  . enoxaparin (LOVENOX) injection  40 mg Subcutaneous Q24H  . insulin aspart  0-9 Units Subcutaneous TID WC   Continuous Infusions: . DAPTOmycin (CUBICIN)  IV     PRN Meds:.diphenhydrAMINE, ketorolac, methocarbamol, polyethylene glycol, sodium chloride flush    Objective: Weight change:   Intake/Output Summary (Last 24 hours) at 09/10/2020 1642 Last data filed at 09/10/2020 1324 Gross per 24 hour  Intake 2580 ml  Output --  Net 2580 ml   Blood pressure 123/75, pulse 75, temperature 97.7 F (36.5 C), temperature source Oral, resp. rate 17, height 5\' 8"  (1.727 m), weight 111.6 kg, SpO2 99 %. Temp:  [97.7 F (36.5 C)-98.2 F (36.8 C)] 97.7 F (36.5 C) (10/31 1414) Pulse Rate:  [72-81] 75 (10/31 1414) Resp:  [14-22] 17 (10/31 1414) BP: (114-128)/(65-75) 123/75 (10/31 1414) SpO2:  [94 %-99 %] 99 % (10/31 1414)  Physical Exam: General: Alert and awake, oriented x3, not in any acute distress. HEENT: anicteric sclera, EOMI CVS regular rate, normal  Chest: , no wheezing, no respiratory distress Abdomen: soft non-distended,  Rash improving Neuro: nonfocal  CBC:    BMET Recent Labs    09/09/20 0939 09/10/20 0410  NA 133* 136  K 5.1 4.1  CL 97* 103  CO2 27 25  GLUCOSE 138* 129*  BUN 22 25*  CREATININE 1.11 0.88  CALCIUM 8.9 8.6*     Liver Panel  Recent Labs    09/09/20 0939  09/10/20 0410  PROT 7.7 6.4*  ALBUMIN 3.5 2.9*  AST 272* 90*  ALT 232* 136*  ALKPHOS 76 60  BILITOT 1.3* 0.3       Sedimentation Rate Recent Labs    09/09/20 0939  ESRSEDRATE 15   C-Reactive Protein Recent Labs    09/09/20 0937  CRP 6.2*    Micro Results: Recent Results (from the past 720 hour(s))  SARS CORONAVIRUS 2 (TAT 6-24 HRS) Nasopharyngeal Nasopharyngeal Swab     Status: None   Collection Time: 08/14/20 11:41 AM   Specimen: Nasopharyngeal Swab  Result Value Ref Range Status   SARS Coronavirus 2 NEGATIVE NEGATIVE Final    Comment: (NOTE) SARS-CoV-2 target nucleic acids are NOT DETECTED.  The SARS-CoV-2 RNA is generally detectable in upper and lower respiratory specimens during the acute phase of infection. Negative results do not preclude SARS-CoV-2 infection, do not rule out co-infections with other pathogens, and should not be used as the sole basis for treatment or other patient management decisions. Negative results must be combined with clinical observations, patient history, and epidemiological information. The expected result is Negative.  Fact Sheet for Patients: 10/14/20  Fact Sheet for Healthcare Providers: HairSlick.no  This test is not yet approved or cleared by the [place]irigir.com FDA and  has been authorized for detection and/or diagnosis of  SARS-CoV-2 by FDA under an Emergency Use Authorization (EUA). This EUA will remain  in effect (meaning this test can be used) for the duration of the COVID-19 declaration under Se ction 564(b)(1) of the Act, 21 U.S.C. section 360bbb-3(b)(1), unless the authorization is terminated or revoked sooner.  Performed at [facility], [address] [place], [place] [address]   Surgical pcr screen     Status: Abnormal   Collection Time: 08/15/20 10:30 AM   Specimen: Nasal Mucosa; Nasal Swab  Result Value Ref Range Status   MRSA, PCR  NEGATIVE NEGATIVE Final   Staphylococcus aureus POSITIVE (A) NEGATIVE Final    Comment: (NOTE) The Xpert SA Assay (FDA approved for NASAL specimens in patients 23 years of age and older), is one component of a comprehensive surveillance program. It is not intended to diagnose infection nor to guide or monitor treatment. Performed at [facility], [address], [place], [place] [address]   Aerobic/Anaerobic Culture (surgical/deep wound)     Status: None   Collection Time: 08/15/20 12:49 PM   Specimen: Synovial, Right Knee; Body Fluid  Result Value Ref Range Status   Specimen Description   Final    SYNOVIAL RIGHT KNEE Performed at [facility], [address] [place], [place] [address]    Special Requests   Final    NONE Performed at [facility], [address], [place], [place] [address]    Gram Stain   Final    ABUNDANT WBC PRESENT,BOTH PMN AND MONONUCLEAR NO ORGANISMS SEEN    Culture   Final    RARE STAPHYLOCOCCUS AUREUS NO ANAEROBES ISOLATED CRITICAL RESULT CALLED TO, READ BACK BY AND VERIFIED WITH: DR Lequita Halt [id number] AT 1238 BY CM Performed at [facility], [address] [place], [place] [address]    Report Status 08/19/2020 FINAL  Final   Organism ID, Bacteria STAPHYLOCOCCUS AUREUS  Final      Susceptibility   Staphylococcus aureus - MIC*    CIPROFLOXACIN <=0.5 SENSITIVE Sensitive     ERYTHROMYCIN >=8 RESISTANT Resistant     GENTAMICIN <=0.5 SENSITIVE Sensitive     OXACILLIN <=0.25 SENSITIVE Sensitive     TETRACYCLINE <=1 SENSITIVE Sensitive     VANCOMYCIN 1 SENSITIVE Sensitive     TRIMETH/SULFA <=10 SENSITIVE Sensitive     CLINDAMYCIN <=0.25 SENSITIVE Sensitive     RIFAMPIN <=0.5 SENSITIVE Sensitive     Inducible Clindamycin NEGATIVE Sensitive     * RARE STAPHYLOCOCCUS AUREUS  Culture, blood (Routine x 2)     Status: None (Preliminary result)   Collection Time: 09/09/20  9:37 AM   Specimen: BLOOD   Result Value Ref Range Status   Specimen Description   Final    BLOOD LEFT ARM Performed at [facility], [address], [place], [place] [address]    Special Requests   Final    BOTTLES DRAWN AEROBIC AND ANAEROBIC Blood Culture adequate volume Performed at [facility], [address] [place], [place] [address]    Culture   Final    NO GROWTH < 24 HOURS Performed at [facility], [address], [place], [place] [address]    Report Status PENDING  Incomplete  Culture, blood (Routine x 2)     Status: None (Preliminary result)   Collection Time: 09/09/20 10:19 AM   Specimen: BLOOD LEFT HAND  Result Value Ref Range Status   Specimen Description   Final  BLOOD LEFT HAND Performed at [facility], [address], [place], [place] [address]    Special Requests   Final    BOTTLES DRAWN AEROBIC AND ANAEROBIC Blood Culture results may not be optimal due to an inadequate volume of blood received in culture bottles Performed at [facility], [address], [place], [place] [address]    Culture   Final    NO GROWTH < 24 HOURS Performed at [facility], [address] [place], [place] [address]    Report Status PENDING  Incomplete  Respiratory Panel by RT PCR (Flu A&B, Covid) - Nasopharyngeal Swab     Status: None   Collection Time: 09/09/20 11:34 AM   Specimen: Nasopharyngeal Swab  Result Value Ref Range Status   SARS Coronavirus 2 by RT PCR NEGATIVE NEGATIVE Final    Comment: (NOTE) SARS-CoV-2 target nucleic acids are NOT DETECTED.  The SARS-CoV-2 RNA is generally detectable in upper respiratoy specimens during the acute phase of infection. The lowest concentration of SARS-CoV-2 viral copies this assay can detect is 131 copies/mL. A negative result does not preclude SARS-Cov-2 infection and should not be used as the sole basis for treatment or other patient management decisions. A  negative result may occur with  improper specimen collection/handling, submission of specimen other than nasopharyngeal swab, presence of viral mutation(s) within the areas targeted by this assay, and inadequate number of viral copies (<131 copies/mL). A negative result must be combined with clinical observations, patient history, and epidemiological information. The expected result is Negative.  Fact Sheet for Patients:  https://www.moore.com/  Fact Sheet for Healthcare Providers:  https://www.young.biz/  This test is no t yet approved or cleared by the [place] FDA and  has been authorized for detection and/or diagnosis of SARS-CoV-2 by FDA under an Emergency Use Authorization (EUA). This EUA will remain  in effect (meaning this test can be used) for the duration of the COVID-19 declaration under Section 564(b)(1) of the Act, 21 U.S.C. section 360bbb-3(b)(1), unless the authorization is terminated or revoked sooner.     Influenza A by PCR NEGATIVE NEGATIVE Final   Influenza B by PCR NEGATIVE NEGATIVE Final    Comment: (NOTE) The Xpert Xpress SARS-CoV-2/FLU/RSV assay is intended as an aid in  the diagnosis of influenza from Nasopharyngeal swab specimens and  should not be used as a sole basis for treatment. Nasal washings and  aspirates are unacceptable for Xpert Xpress SARS-CoV-2/FLU/RSV  testing.  Fact Sheet for Patients: https://www.moore.com/  Fact Sheet for Healthcare Providers: https://www.young.biz/  This test is not yet approved or cleared by the [place] FDA and  has been authorized for detection and/or diagnosis of SARS-CoV-2 by  FDA under an Emergency Use Authorization (EUA). This EUA will remain  in effect (meaning this test can be used) for the duration of the  Covid-19 declaration under Section 564(b)(1) of the Act, 21  U.S.C. section 360bbb-3(b)(1), unless the authorization  is  terminated or revoked. Performed at [facility], [address] [place], [place] [address]   Culture, blood (single)     Status: None (Preliminary result)   Collection Time: 09/09/20 11:41 AM   Specimen: BLOOD  Result Value Ref Range Status   Specimen Description   Final    BLOOD PORTA CATH RIGHT ARM Performed at [facility], [address], [place], [place] [address]    Special Requests   Final    BOTTLES DRAWN AEROBIC AND  ANAEROBIC Blood Culture adequate volume Performed at [facility], [address] [place], [place] [address]    Culture   Final    NO GROWTH < 24 HOURS Performed at [facility], [address] [place], [place] [address]    Report Status PENDING  Incomplete    Studies/Results: DG Chest 2 View  Result Date: 09/09/2020 CLINICAL DATA:  Suspected sepsis. Possible PICC infection in the right arm. Temperature to 103.5 last night. Patient does not feel well. EXAM: CHEST - 2 VIEW COMPARISON:  None. FINDINGS: The heart size and mediastinal contours are within normal limits. Both lungs are clear. No pleural effusion or pneumothorax. The visualized skeletal structures are intact. IMPRESSION: No active cardiopulmonary disease. Electronically Signed   By: Amie Portlandavid  Ormond M.D.   On: 09/09/2020 10:47   DG Knee Complete 4 Views Right  Result Date: 09/09/2020 CLINICAL DATA:  Fever to 103.5 last night. Reported right knee surgery with incision and drainage on 11/16/2019. Partial right knee arthroplasty on 07/29/2019. EXAM: RIGHT KNEE - COMPLETE 4+ VIEW COMPARISON:  None. FINDINGS: No fracture.  No bone lesion. There is a line of lucency adjacent to the tibial component of the medial compartment hemiarthroplasty, which could reflect loosening or infection. The prosthetic components are well aligned. Mild lateral joint space compartment narrowing. Minor marginal spurring from the patella. Small to moderate  suprapatellar joint effusion. There is significant anterior knee soft tissue swelling, anterior to the patella and quadriceps and patellar tendons. IMPRESSION: 1. Linear lucency adjacent to the tibial component of the medial compartment hemiarthroplasty. This could reflect loosening or infection. 2. Small to moderate joint effusion with anterior soft tissue swelling. 3. No fractures.  No other acute finding. Electronically Signed   By: Amie Portlandavid  Ormond M.D.   On: 09/09/2020 10:51      Assessment/Plan:  INTERVAL HISTORY: rash improving, PICC not in correct position   Principal Problem:   Adverse reaction to antibiotic, initial encounter Active Problems:   Drug-induced liver injury   Prediabetes   OSA (obstructive sleep apnea)    Jesus Lalan Holstein Sr. is a 64 y.o. 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 Drue SecondSnider is back tomorrow.   LOS: 0 days   Acey LavCornelius Van Dam 09/10/2020, 4:42 PM

## 2020-09-10 NOTE — TOC Progression Note (Signed)
Transition of Care (TOC) - Progression Note    Patient Details  Name: Jesus Corpus Sr. MRN: [id number] Date of Birth: 1955-11-26  Transition of Care [facility]) CM/SW Contact  Armanda Heritage, RN Phone Number: 09/10/2020, 1:29 PM  Clinical Narrative:    Referral made to [employer] rep Jeri Modena for IV home antibiotics.     Expected Discharge Plan: Home w Home Health Services Barriers to Discharge: Continued Medical Work up  Expected Discharge Plan and Services Expected Discharge Plan: Home w Home Health Services   Discharge Planning Services: CM Consult   Living arrangements for the past 2 months: Single Family Home                           HH Arranged: IV Antibiotics HH Agency: Other - See comment Quarry manager) Date HH Agency Contacted: 09/10/20 Time HH Agency Contacted: 1328 Representative spoke with at [facility] Agency: Jeri Modena   Social Determinants of Health (SDOH) Interventions    Readmission Risk Interventions Readmission Risk Prevention Plan 08/16/2020  Post Dischage Appt Complete  Medication Screening Complete  Transportation Screening Complete  Some recent data might be hidden

## 2020-09-10 NOTE — Progress Notes (Signed)
Pharmacy Antibiotic Note  Jesus Gindlesperger Sr. is a 63 y.o. male admitted on 09/09/2020 with probable bacteremia.  Pharmacy has been consulted for Daptomycin dosing.  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 ID MD contacted 10/29 with adverse rx, prescribed Hydroxyzine for possible allergic reaction. Pt presented to ED on 10/30 with fever and rash.  ID is consulted.  Due to obesity, will use adjusted weight for Daptomycin dosing. Weight 111.6 kg, BMI 37.4 Ideal body weight: 68.4 kg (150 lb 12.7 oz) Adjusted ideal body weight: 85.7 kg (188 lb 14 oz)  Plan:  Daptomycin 700 mg (~8 mg/kg) IV q24h   CK ordered weekly starting 10/31   Height: 5\' 8"  (172.7 cm) Weight: 111.6 kg (246 lb) IBW/kg (Calculated) : 68.4  Temp (24hrs), Avg:98 F (36.7 C), Min:97.7 F (36.5 C), Max:98.2 F (36.8 C)  Recent Labs  Lab 09/09/20 0937 09/09/20 0939 09/09/20 1128 09/10/20 0410  WBC  --  5.7  --  5.2  CREATININE  --  1.11  --  0.88  LATICACIDVEN 1.1  --  1.1  --     Estimated Creatinine Clearance: 104.1 mL/min (by C-G formula based on SCr of 0.88 mg/dL).    Allergies  Allergen Reactions  . Benadryl [Diphenhydramine] Other (See Comments)    He cannot recall what happened. Maybe "took too much."    Antimicrobials this admission: 10/30 Daptomycin >>   Microbiology results: 10/30 BCx: sent Prev Cx 10/5 R knee synovial fluid: MSSA (Gent resistant) otherwise pan-sens  Thank you for allowing pharmacy to be a part of this patient's care.  11/30 PharmD, BCPS Clinical Pharmacist [facility] main pharmacy [phone] 09/10/2020 4:29 PM

## 2020-09-10 NOTE — Evaluation (Signed)
Physical Therapy Evaluation Patient Details Name: Jesus Nunez. MRN: [id number] DOB: 05-17-1956 Today's Date: 09/10/2020   History of Present Illness  64 year old male with a past medical history of type 2 diabetes, OSA, right medial unicompartmental knee replacement (UKR) which was complicated by anterior knee erythema and treated with a course of cephalexin September 2020 and recent hospitalization from 10/4-10/7 for MSSA right knee joint infection s/p right UKR irrigation and debridement (08/15/20) and discharged on Cefazolin and Rifampin per PICC line who began having nausea, paresthesias, pruritis and fever  Clinical Impression  Patient evaluated by Physical Therapy with no further acute PT needs identified. All education has been completed and the patient has no further questions.  Recommend pt resume OPPT per ortho MD, pt reports Dr Alvan Dame told him to wait on resuming physical therapy.  (he states he does not want HHPT).  Advised pt on use of cane to incr safety and diminish gait deviations. Reviewed TKA HEP, importance of ROM as tolerated. Pt has crutches with him but reports not using them despite having to furniture walk in the room and amb with significant deviations. Pt does not feel he needs PT in acute setting.  See below for any follow-up Physical Therapy or equipment needs. PT is signing off. Thank you for this referral.     Follow Up Recommendations Outpatient PT (return to OPPT (pt report  Dr Alvan Dame said he could hold on PT for now))    Equipment Recommendations  None recommended by PT    Recommendations for Other Services       Precautions / Restrictions Precautions Precautions: Knee;Fall Restrictions Weight Bearing Restrictions: No      Mobility  Bed Mobility Overal bed mobility: Modified Independent                  Transfers   Equipment used: None Transfers: Sit to/from Stand Sit to Stand: Supervision         General transfer comment: for  safety  Ambulation/Gait Ambulation/Gait assistance: Min guard;Supervision Gait Distance (Feet): 40 Feet Assistive device: None Gait Pattern/deviations: Antalgic;Decreased stance time - right;Step-through pattern;Decreased stride length     General Gait Details: reviewed correct gait pattern, avoiding excessive RLE, TKE on heel strike, equal wt shift and use of cane or single crutch to in safety and avoid furniture amb/decr risk of fall  Stairs            Wheelchair Mobility    Modified Rankin (Stroke Patients Only)       Balance Overall balance assessment: Needs assistance   Sitting balance-Leahy Scale: Good     Standing balance support: No upper extremity supported;During functional activity Standing balance-Leahy Scale: Fair                               Pertinent Vitals/Pain Pain Assessment: Faces Faces Pain Scale: Hurts a little bit Pain Location: right knee Pain Descriptors / Indicators: Sore;Tightness Pain Intervention(s): Limited activity within patient's tolerance;Monitored during session    Home Living Family/patient expects to be discharged to:: Private residence Living Arrangements: Spouse/significant other;Children   Type of Home: House Home Access: Stairs to enter Entrance Stairs-Rails: Right Entrance Stairs-Number of Steps: 2 Home Layout: One level Home Equipment: Walker - 2 wheels;Crutches;Cane - single point      Prior Function Level of Independence: Independent;Independent with assistive device(s)         Comments: amb with crutches while knee was "jacked  up"     Hand Dominance        Extremity/Trunk Assessment   Upper Extremity Assessment Upper Extremity Assessment: Overall WFL for tasks assessed    Lower Extremity Assessment RLE Deficits / Details: knee strength grossly 3/5; knee AROM ~12 degrees to 75 degrees flexion. knee edematous       Communication   Communication: No difficulties  Cognition  Arousal/Alertness: Awake/alert Behavior During Therapy: WFL for tasks assessed/performed Overall Cognitive Status: Within Functional Limits for tasks assessed                                        General Comments      Exercises Total Joint Exercises Ankle Circles/Pumps: AROM;Both;10 reps Quad Sets: 5 reps;Right;AROM Heel Slides: AROM;Right;5 reps Knee Flexion: AROM;Right (3 reps sitting)   Assessment/Plan    PT Assessment All further PT needs can be met in the next venue of care  PT Problem List         PT Treatment Interventions      PT Goals (Current goals can be found in the Care Plan section)  Acute Rehab PT Goals Patient Stated Goal: knee not to be jacked up anymore PT Goal Formulation: All assessment and education complete, DC therapy    Frequency     Barriers to discharge        Co-evaluation               AM-PAC PT "6 Clicks" Mobility  Outcome Measure Help needed turning from your back to your side while in a flat bed without using bedrails?: None Help needed moving from lying on your back to sitting on the side of a flat bed without using bedrails?: None Help needed moving to and from a bed to a chair (including a wheelchair)?: A Little Help needed standing up from a chair using your arms (e.g., wheelchair or bedside chair)?: A Little Help needed to walk in hospital room?: A Little Help needed climbing 3-5 steps with a railing? : A Little 6 Click Score: 20    End of Session   Activity Tolerance: Patient tolerated treatment well Patient left: in bed;with call bell/phone within reach;with family/visitor present   PT Visit Diagnosis: Other abnormalities of gait and mobility (R26.89)    Time: 0175-1025 PT Time Calculation (min) (ACUTE ONLY): 20 min   Charges:   PT Evaluation $PT Eval Low Complexity: [place], PT  Acute Rehab Dept ([place]) [phone] Pager  [phone]  09/10/2020   [facility] 09/10/2020, 12:18 PM

## 2020-09-10 NOTE — Progress Notes (Signed)
Rn paged Dr. Daiva Eves to seek clarification of use of pt picc line. Rn awaiting a call back. Pt picc line continues to be normal with no signs of visible infection.

## 2020-09-10 NOTE — Progress Notes (Signed)
Pt is requesting an xray to verify placement of picc line exchange tomorrow. Pt is concerned for continued issues with picc line.

## 2020-09-10 NOTE — Progress Notes (Signed)
Notified primary RN Victorino Dike of malpositioned right SL PICC.Not able to use for IVF's/IV medicines until PICC exchanged.

## 2020-09-10 NOTE — Progress Notes (Addendum)
PROGRESS NOTE    Jesus Cofer Sr.  CBJ:[id number] DOB: 08-31-1956 DOA: 09/09/2020 PCP: Mliss Sax, MD    Chief Complaint  Patient presents with  . PICC Line Infection    Brief Narrative:  HPI per Dr. Dairl Ponder This is a 64 year old male with a past medical history of type 2 diabetes, OSA, right medial unicompartmental knee replacement (UKR) which was complicated by anterior knee erythema and treated with a course of cephalexin September 2020 and recent hospitalization from 10/4-10/7 for MSSA right knee joint infection s/p right UKR irrigation and debridement (08/15/20) and discharged on Cefazolin and Rifampin per PICC line who began having nausea, paresthesias, pruritis and fever (103.54F) on 09/08/20 after taking 2 doses of his Cefazolin.  Per patient, he was in his usual state of health and recovering at home after his recent surgery when he was to begin his a.m. dose of cefazolin yesterday.  Shortly after beginning his infusion he began having symptoms stated above without fever at the time.  This lasted about 2 hours.  He then took an Allegra prior to his afternoon infusion and had recurrence of the symptoms plus fever up to 103.5.  Contacted his ID doctor who recommended hydroxyzine every 6 hours as needed for itching.  Patient skipped his evening dose for fear of having recurrence of this reaction.  When he awoke this morning he was feeling a bit better and took his a.m. dose of cefazolin with recurrence of symptoms. He reported the symptoms to the on-call ID physician, Dr. Algis Liming who was concern for a PICC line infection given his high fevers and recommended that he come to the ED. Reported these 3 doses were from a new box of antibiotics that he received and also stated that he has been also taking grapefruit seed oil while on the rifampin and cefazolin antibiotics.  Took rifampin yesterday but did not have his dose yet today.  ED Course: Afebrile, hemodynamically stable on room air.  Notable labs: Na 133, K 6.1, AST 272, ALT 232, T bili 1.3, WBC 5.7, lactic acid 1.1, UA negative. CXR unremarkable. Right knee xray:  Small to moderate joint effusion with anterior soft tissue swelling and possibly loosening or infection of the joint. ID and Ortho were consulted. ID initially recommended changing antibiotics to Daptomycin and Cefepime.    Assessment & Plan:   Principal Problem:   Adverse reaction to antibiotic, initial encounter Active Problems:   Drug-induced liver injury   Prediabetes  1 suspected allergic/adverse reaction to cefazolin and/or rifampin Patient presented with nausea, paresthesias, pruritus, fever associated with IV antibiotics of cefazolin that patient was discharged home on.  Currently afebrile.  Concern for adverse reaction to cefazolin and oral rifampin.  Patient also noted to have an elevated LFTs.  LFTs trending down.  Clinical improvement.  Patient pancultured and cultures pending.  IV antibiotics have been changed to IV daptomycin.  ID following and recommending PICC exchange.  Continue IV daptomycin.  Per ID.  2.  Drug-induced liver injury Could likely be secondary to rifampin in the setting of grapefruit extract which patient was taking prior to admission.  Currently asymptomatic.  Tylenol hold.  LFTs trending down.  Supportive care.  Follow.  3.  Prediabetes Hemoglobin A1c 6.3.  Sliding scale insulin.  4.  Recent MSSA right knee joint infection status post right U KR irrigated and debridement (08/15/2020) Patient currently afebrile.  Patient discharged on cefazolin and rifampin per PICC line however patient presented with nausea, paresthesias, pruritus, fever  concern for allergic reaction to antibiotics.  Antibiotics discontinued.  Patient pancultured.  IV antibiotics have been changed to IV daptomycin per ID recommendations.  ID following and appreciate input and recommendations.  Outpatient follow-up with orthopedics.   DVT prophylaxis:  Lovenox Code Status: Full Family Communication: Updated patient.  No family at bedside. Disposition:   Status is: Observation    Dispo: The patient is from: Home              Anticipated d/c is to: Home              Anticipated d/c date is: Hopefully 1 to 2 days, when cleared by ID.              Patient currently on IV antibiotics, being followed by ID.  Not stable for discharge.       Consultants:   Infectious diseases: Dr. Daiva EvesVan Dam 09/09/2020  Procedures:   Chest x-ray 09/09/2020  Plain films of the right knee 09/09/2020  Antimicrobials:  IV daptomycin 09/09/2020>>>>>   Subjective: Patient sitting up on the side of the bed.  Patient stated ambulated in hallway today.  Denies any chest pain.  No shortness of breath.  Feels rash is improving.  Afebrile.  Hoping to be discharged today.  Objective: Vitals:   09/09/20 1754 09/09/20 2104 09/10/20 0143 09/10/20 0504  BP: 114/70 124/65 128/75 127/74  Pulse: 81 75 78 72  Resp: (!) 22 20 16 14   Temp: 97.7 F (36.5 C) 98.1 F (36.7 C) 98.2 F (36.8 C) 98.2 F (36.8 C)  TempSrc: Oral Oral Oral Oral  SpO2: 94% 97% 96% 94%  Weight:      Height:        Intake/Output Summary (Last 24 hours) at 09/10/2020 1135 Last data filed at 09/10/2020 0908 Gross per 24 hour  Intake 2020.56 ml  Output --  Net 2020.56 ml   Filed Weights   09/09/20 0921 09/09/20 1009  Weight: 111.1 kg 111.6 kg    Examination:  General exam: Appears calm and comfortable  Respiratory system: Clear to auscultation. Respiratory effort normal. Cardiovascular system: S1 & S2 heard, RRR. No JVD, murmurs, rubs, gallops or clicks. No pedal edema. Gastrointestinal system: Abdomen is nondistended, soft and nontender. No organomegaly or masses felt. Normal bowel sounds heard. Central nervous system: Alert and oriented. No focal neurological deficits. Extremities: Right knee with some slight erythema, nontender to palpation, slight swelling.  Skin: Rash  on torso and lower extremities slowly improving.  Psychiatry: Judgement and insight appear normal. Mood & affect appropriate.     Data Reviewed: I have personally reviewed following labs and imaging studies  CBC: Recent Labs  Lab 09/09/20 0939 09/10/20 0410  WBC 5.7 5.2  NEUTROABS 3.2  --   HGB 14.4 13.0  HCT 43.9 40.0  MCV 89.6 90.1  PLT 168 161    Basic Metabolic Panel: Recent Labs  Lab 09/09/20 0939 09/10/20 0410  NA 133* 136  K 5.1 4.1  CL 97* 103  CO2 27 25  GLUCOSE 138* 129*  BUN 22 25*  CREATININE 1.11 0.88  CALCIUM 8.9 8.6*    GFR: Estimated Creatinine Clearance: 104.1 mL/min (by C-G formula based on SCr of 0.88 mg/dL).  Liver Function Tests: Recent Labs  Lab 09/09/20 0939 09/10/20 0410  AST 272* 90*  ALT 232* 136*  ALKPHOS 76 60  BILITOT 1.3* 0.3  PROT 7.7 6.4*  ALBUMIN 3.5 2.9*    CBG: Recent Labs  Lab 09/09/20 1646  09/09/20 1943 09/10/20 0746  GLUCAP 125* 152* 129*     Recent Results (from the past 240 hour(s))  Culture, blood (Routine x 2)     Status: None (Preliminary result)   Collection Time: 09/09/20  9:37 AM   Specimen: BLOOD  Result Value Ref Range Status   Specimen Description   Final    BLOOD LEFT ARM Performed at [facility], [address], [place], [place] [address]    Special Requests   Final    BOTTLES DRAWN AEROBIC AND ANAEROBIC Blood Culture adequate volume Performed at [facility], [address], [place], [place] [address]    Culture   Final    NO GROWTH < 24 HOURS Performed at [facility], [address], [place], [place] [address]    Report Status PENDING  Incomplete  Culture, blood (Routine x 2)     Status: None (Preliminary result)   Collection Time: 09/09/20 10:19 AM   Specimen: BLOOD LEFT HAND  Result Value Ref Range Status   Specimen Description   Final    BLOOD LEFT HAND Performed at [facility], [address], [place],  [place] [address]    Special Requests   Final    BOTTLES DRAWN AEROBIC AND ANAEROBIC Blood Culture results may not be optimal due to an inadequate volume of blood received in culture bottles Performed at [facility], [address] [place], [place] [address]    Culture   Final    NO GROWTH < 24 HOURS Performed at [facility], [address], [place], [place] [address]    Report Status PENDING  Incomplete  Respiratory Panel by RT PCR (Flu A&B, Covid) - Nasopharyngeal Swab     Status: None   Collection Time: 09/09/20 11:34 AM   Specimen: Nasopharyngeal Swab  Result Value Ref Range Status   SARS Coronavirus 2 by RT PCR NEGATIVE NEGATIVE Final    Comment: (NOTE) SARS-CoV-2 target nucleic acids are NOT DETECTED.  The SARS-CoV-2 RNA is generally detectable in upper respiratoy specimens during the acute phase of infection. The lowest concentration of SARS-CoV-2 viral copies this assay can detect is 131 copies/mL. A negative result does not preclude SARS-Cov-2 infection and should not be used as the sole basis for treatment or other patient management decisions. A negative result may occur with  improper specimen collection/handling, submission of specimen other than nasopharyngeal swab, presence of viral mutation(s) within the areas targeted by this assay, and inadequate number of viral copies (<131 copies/mL). A negative result must be combined with clinical observations, patient history, and epidemiological information. The expected result is Negative.  Fact Sheet for Patients:  https://www.moore.com/  Fact Sheet for Healthcare Providers:  https://www.young.biz/  This test is no t yet approved or cleared by the [place] FDA and  has been authorized for detection and/or diagnosis of SARS-CoV-2 by FDA under an Emergency Use Authorization (EUA). This EUA will remain  in effect (meaning this test can be used) for the  duration of the COVID-19 declaration under Section 564(b)(1) of the Act, 21 U.S.C. section 360bbb-3(b)(1), unless the authorization is terminated or revoked sooner.     Influenza A by PCR NEGATIVE NEGATIVE Final   Influenza B by PCR NEGATIVE NEGATIVE Final    Comment: (NOTE) The Xpert Xpress SARS-CoV-2/FLU/RSV assay is intended as an aid in  the diagnosis of influenza from Nasopharyngeal swab specimens and  should not be used as a  sole basis for treatment. Nasal washings and  aspirates are unacceptable for Xpert Xpress SARS-CoV-2/FLU/RSV  testing.  Fact Sheet for Patients: https://www.moore.com/  Fact Sheet for Healthcare Providers: https://www.young.biz/  This test is not yet approved or cleared by the [place] FDA and  has been authorized for detection and/or diagnosis of SARS-CoV-2 by  FDA under an Emergency Use Authorization (EUA). This EUA will remain  in effect (meaning this test can be used) for the duration of the  Covid-19 declaration under Section 564(b)(1) of the Act, 21  U.S.C. section 360bbb-3(b)(1), unless the authorization is  terminated or revoked. Performed at [facility], [address], [place], [place] [address]   Culture, blood (single)     Status: None (Preliminary result)   Collection Time: 09/09/20 11:41 AM   Specimen: BLOOD  Result Value Ref Range Status   Specimen Description   Final    BLOOD PORTA CATH RIGHT ARM Performed at [facility], [address], [place], [place] [address]    Special Requests   Final    BOTTLES DRAWN AEROBIC AND ANAEROBIC Blood Culture adequate volume Performed at [facility], [address] [place], [place] [address]    Culture   Final    NO GROWTH < 24 HOURS Performed at [facility], [address], [place], [place] [address]    Report Status PENDING  Incomplete         Radiology Studies: DG Chest 2  View  Result Date: 09/09/2020 CLINICAL DATA:  Suspected sepsis. Possible PICC infection in the right arm. Temperature to 103.5 last night. Patient does not feel well. EXAM: CHEST - 2 VIEW COMPARISON:  None. FINDINGS: The heart size and mediastinal contours are within normal limits. Both lungs are clear. No pleural effusion or pneumothorax. The visualized skeletal structures are intact. IMPRESSION: No active cardiopulmonary disease. Electronically Signed   By: Amie Portland M.D.   On: 09/09/2020 10:47   DG Knee Complete 4 Views Right  Result Date: 09/09/2020 CLINICAL DATA:  Fever to 103.5 last night. Reported right knee surgery with incision and drainage on 11/16/2019. Partial right knee arthroplasty on 07/29/2019. EXAM: RIGHT KNEE - COMPLETE 4+ VIEW COMPARISON:  None. FINDINGS: No fracture.  No bone lesion. There is a line of lucency adjacent to the tibial component of the medial compartment hemiarthroplasty, which could reflect loosening or infection. The prosthetic components are well aligned. Mild lateral joint space compartment narrowing. Minor marginal spurring from the patella. Small to moderate suprapatellar joint effusion. There is significant anterior knee soft tissue swelling, anterior to the patella and quadriceps and patellar tendons. IMPRESSION: 1. Linear lucency adjacent to the tibial component of the medial compartment hemiarthroplasty. This could reflect loosening or infection. 2. Small to moderate joint effusion with anterior soft tissue swelling. 3. No fractures.  No other acute finding. Electronically Signed   By: Amie Portland M.D.   On: 09/09/2020 10:51        Scheduled Meds: . Chlorhexidine Gluconate Cloth  6 each Topical Q0600  . enoxaparin (LOVENOX) injection  40 mg Subcutaneous Q24H   Continuous Infusions: . DAPTOmycin (CUBICIN)  IV Stopped (09/09/20 1501)     LOS: 0 days    Time spent: 35 minutes    Ramiro Harvest, MD [facility]   To contact the  attending provider between 7A-7P or the covering provider during after hours 7P-7A, please log into the web site [email or web address] and access using universal [place]  password for that web site. If you do not have the password, please call the hospital operator.  09/10/2020, 11:35 AM

## 2020-09-10 NOTE — Progress Notes (Signed)
PHARMACY CONSULT NOTE FOR:  OUTPATIENT  PARENTERAL ANTIBIOTIC THERAPY (OPAT)  Indication: PJI Regimen: Daptomycin End date: 09/26/2020  IV antibiotic discharge orders are pended. To discharging provider:  please sign these orders via discharge navigator,  Select New Orders & click on the button choice - Manage This Unsigned Work.   Thank you for allowing pharmacy to be a part of this patient's care.  Lynann Beaver PharmD, BCPS Clinical Pharmacist [facility] main pharmacy [phone] 09/10/2020 4:33 PM

## 2020-09-11 ENCOUNTER — Telehealth: Payer: 59 | Admitting: Family Medicine

## 2020-09-11 DIAGNOSIS — D7212 Drug rash with eosinophilia and systemic symptoms syndrome: Secondary | ICD-10-CM

## 2020-09-11 DIAGNOSIS — E119 Type 2 diabetes mellitus without complications: Secondary | ICD-10-CM | POA: Diagnosis not present

## 2020-09-11 DIAGNOSIS — T8453XD Infection and inflammatory reaction due to internal right knee prosthesis, subsequent encounter: Secondary | ICD-10-CM

## 2020-09-11 DIAGNOSIS — T3695XA Adverse effect of unspecified systemic antibiotic, initial encounter: Secondary | ICD-10-CM | POA: Diagnosis not present

## 2020-09-11 DIAGNOSIS — K719 Toxic liver disease, unspecified: Secondary | ICD-10-CM | POA: Diagnosis not present

## 2020-09-11 DIAGNOSIS — T50905A Adverse effect of unspecified drugs, medicaments and biological substances, initial encounter: Secondary | ICD-10-CM

## 2020-09-11 LAB — HEPATIC FUNCTION PANEL
ALT: 103 U/L — ABNORMAL HIGH (ref 0–44)
AST: 47 U/L — ABNORMAL HIGH (ref 15–41)
Albumin: 3.2 g/dL — ABNORMAL LOW (ref 3.5–5.0)
Alkaline Phosphatase: 63 U/L (ref 38–126)
Bilirubin, Direct: 0.1 mg/dL (ref 0.0–0.2)
Indirect Bilirubin: 0.4 mg/dL (ref 0.3–0.9)
Total Bilirubin: 0.5 mg/dL (ref 0.3–1.2)
Total Protein: 6.8 g/dL (ref 6.5–8.1)

## 2020-09-11 LAB — CBC WITH DIFFERENTIAL/PLATELET
Abs Immature Granulocytes: 0.02 10*3/uL (ref 0.00–0.07)
Basophils Absolute: 0 10*3/uL (ref 0.0–0.1)
Basophils Relative: 1 %
Eosinophils Absolute: 1.7 10*3/uL — ABNORMAL HIGH (ref 0.0–0.5)
Eosinophils Relative: 26 %
HCT: 42.5 % (ref 39.0–52.0)
Hemoglobin: 13.6 g/dL (ref 13.0–17.0)
Immature Granulocytes: 0 %
Lymphocytes Relative: 23 %
Lymphs Abs: 1.5 10*3/uL (ref 0.7–4.0)
MCH: 28.8 pg (ref 26.0–34.0)
MCHC: 32 g/dL (ref 30.0–36.0)
MCV: 89.9 fL (ref 80.0–100.0)
Monocytes Absolute: 0.7 10*3/uL (ref 0.1–1.0)
Monocytes Relative: 10 %
Neutro Abs: 2.6 10*3/uL (ref 1.7–7.7)
Neutrophils Relative %: 40 %
Platelets: 223 10*3/uL (ref 150–400)
RBC: 4.73 MIL/uL (ref 4.22–5.81)
RDW: 13.3 % (ref 11.5–15.5)
WBC: 6.6 10*3/uL (ref 4.0–10.5)
nRBC: 0 % (ref 0.0–0.2)

## 2020-09-11 LAB — BASIC METABOLIC PANEL
Anion gap: 8 (ref 5–15)
BUN: 24 mg/dL — ABNORMAL HIGH (ref 8–23)
CO2: 27 mmol/L (ref 22–32)
Calcium: 8.9 mg/dL (ref 8.9–10.3)
Chloride: 102 mmol/L (ref 98–111)
Creatinine, Ser: 0.97 mg/dL (ref 0.61–1.24)
GFR, Estimated: >60 mL/min (ref >60)
Glucose, Bld: 131 mg/dL — ABNORMAL HIGH (ref 70–99)
Potassium: 4.3 mmol/L (ref 3.5–5.1)
Sodium: 137 mmol/L (ref 135–145)

## 2020-09-11 LAB — GLUCOSE, CAPILLARY
Glucose-Capillary: 119 mg/dL — ABNORMAL HIGH (ref 70–99)
Glucose-Capillary: 85 mg/dL (ref 70–99)

## 2020-09-11 MED ORDER — HEPARIN SOD (PORK) LOCK FLUSH 100 UNIT/ML IV SOLN
250.0000 [IU] | INTRAVENOUS | Status: DC | PRN
  Administered 2020-09-11: 250 [IU]
  Filled 2020-09-11: qty 2.5

## 2020-09-11 MED ORDER — HEPARIN SOD (PORK) LOCK FLUSH 100 UNIT/ML IV SOLN
250.0000 [IU] | Freq: Every day | INTRAVENOUS | Status: DC
  Filled 2020-09-11: qty 2.5

## 2020-09-11 MED ORDER — DAPTOMYCIN IV (FOR PTA / DISCHARGE USE ONLY): 700.0000 mg | INTRAVENOUS | 0 refills | Status: AC

## 2020-09-11 NOTE — Progress Notes (Signed)
Peripherally Inserted Central Catheter Placement  The IV Nurse has discussed with the patient and/or persons authorized to consent for the patient, the purpose of this procedure and the potential benefits and risks involved with this procedure.  The benefits include less needle sticks, lab draws from the catheter, and the patient may be discharged home with the catheter. Risks include, but not limited to, infection, bleeding, blood clot (thrombus formation), and puncture of an artery; nerve damage and irregular heartbeat and possibility to perform a PICC exchange if needed/ordered by physician.  Alternatives to this procedure were also discussed.  Bard Power PICC patient education guide, fact sheet on infection prevention and patient information card has been provided to patient /or left at bedside.  PICC exchanged.   PICC Placement Documentation  PICC Single Lumen 08/15/20 PICC Right Brachial 39 cm 0 cm (Active)  Indication for Insertion or Continuance of Line Home intravenous therapies (PICC only) 09/11/20 0316  Exposed Catheter (cm) 4 cm 09/11/20 0316  Site Assessment Clean;Dry;Intact 09/11/20 0825  Line Status Flushed;Saline locked;Blood return noted 09/11/20 0316  Dressing Type Transparent;Securing device 09/11/20 0316  Dressing Status Clean;Dry;Intact 09/11/20 0316  Antimicrobial disc in place? Yes 09/11/20 0316  Safety Lock Not Applicable 09/11/20 0316  Line Care Connections checked and tightened 09/11/20 0316  Line Adjustment (NICU/IV Team Only) No 09/11/20 0316  Dressing Intervention New dressing 08/15/20 2033  Dressing Change Due 09/12/20 09/11/20 0316     PICC Single Lumen 09/11/20 PICC Right  39 cm 0 cm (Active)  Indication for Insertion or Continuance of Line Home intravenous therapies (PICC only) 09/11/20 1224  Exposed Catheter (cm) 0 cm 09/11/20 1224  Site Assessment Clean;Dry;Intact 09/11/20 1224  Line Status Flushed;Saline locked;Blood return noted 09/11/20 1224  Dressing  Type Transparent 09/11/20 1224  Dressing Status Clean;Dry;Intact 09/11/20 1224  Antimicrobial disc in place? Yes 09/11/20 1224  Safety Lock Not Applicable 09/11/20 1224  Line Care Connections checked and tightened 09/11/20 1224  Line Adjustment (NICU/IV Team Only) No 09/11/20 1224  Dressing Intervention New dressing 09/11/20 1224  Dressing Change Due 09/18/20 09/11/20 1224       Jesus Nunez, [name] 09/11/2020, 12:25 PM

## 2020-09-11 NOTE — TOC Transition Note (Signed)
Transition of Care (TOC) - CM/SW Discharge Note   Patient Details  Name: Jesus Koffman Sr. MRN: [id number] Date of Birth: 03/03/1956  Transition of Care (TOC) CM/SW Contact:  HOYLE, LUCY, LCSW Phone Number: 09/11/2020, 2:24 PM   Clinical Narrative:    Met with pt with anticipation he will be medically cleared for dc today.  Pt aware/ understands that [facility] and [facility] will continue to follow for IV abx and nursing services.  No further TOC needs.   Final next level of care: Home w Home Health Services Barriers to Discharge: Continued Medical Work up   Patient Goals and CMS Choice Patient states their goals for this hospitalization and ongoing recovery are:: to go home with IV antibiotics      Discharge Placement                       Discharge Plan and Services   Discharge Planning Services: CM Consult            DME Arranged: N/A DME Agency: NA       HH Arranged: RN, IV Antibiotics HH Agency: [facility], Other - See comment (and [facility]) Date HH Agency Contacted: 09/10/20 Time HH Agency Contacted: 1328 Representative spoke with at HH Agency: Pam Chandler  Social Determinants of Health (SDOH) Interventions     Readmission Risk Interventions Readmission Risk Prevention Plan 08/16/2020  Post Dischage Appt Complete  Medication Screening Complete  Transportation Screening Complete  Some recent data might be hidden      

## 2020-09-11 NOTE — Progress Notes (Addendum)
[facility]    Date of Admission:  09/09/2020   Total days of antibiotics 27           ID: Jesus La Sr. is a 64 y.o. male with MSSA PJI admitted for drug induced rash, and drug induced transaminitis Principal Problem:   Adverse reaction to antibiotic, initial encounter Active Problems:   Drug-induced liver injury   Prediabetes    Subjective: Feels better, rash improved, and right knee swelling decreased. Upset about his care he is receiving, he feels many of his care team are miscommunicating  He reports mistrusting the medical care, also mentions that his brother recently died of unknown GI causes after getting medical care.   Ros: denies fever, rash, diarrhea,  Medications:  . Chlorhexidine Gluconate Cloth  6 each Topical Q0600  . enoxaparin (LOVENOX) injection  40 mg Subcutaneous Q24H  . heparin lock flush  250 Units Intracatheter Daily  . insulin aspart  0-9 Units Subcutaneous TID WC    Objective: Vital signs in last 24 hours: Temp:  [97.6 F (36.4 C)-98.5 F (36.9 C)] 97.6 F (36.4 C) (11/01 0548) Pulse Rate:  [73-77] 73 (11/01 0548) Resp:  [16-18] 16 (11/01 0548) BP: (137-145)/(77-79) 145/77 (11/01 0548) SpO2:  [96 %] 96 % (11/01 0548) Physical Exam  Constitutional: He is oriented to person, place, and time. He appears well-developed and well-nourished. No distress.  HENT:  Mouth/Throat: Oropharynx is clear and moist. No oropharyngeal exudate.  Cardiovascular: Normal rate, regular rhythm and normal heart sounds. Exam reveals no gallop and no friction rub.  No murmur heard.  Pulmonary/Chest: Effort normal and breath sounds normal. No respiratory distress. He has no wheezes.  Abdominal: Soft. Bowel sounds are normal. He exhibits no distension. There is no tenderness.  Ext: right knee warm to touch, slight swelling. Right arm picc line is c/d/i Neurological: moves extremities without difficulty Skin: Skin is warm and dry.only  trace erythema remaining.(improved from photos on admit)No erythema.  Psychiatric: He has a normal mood and affect. His behavior is normal.     Lab Results Recent Labs    09/10/20 0410 09/11/20 0318  WBC 5.2 6.6  HGB 13.0 13.6  HCT 40.0 42.5  NA 136 137  K 4.1 4.3  CL 103 102  CO2 25 27  BUN 25* 24*  CREATININE 0.88 0.97   Liver Panel Recent Labs    09/10/20 0410 09/11/20 0318  PROT 6.4* 6.8  ALBUMIN 2.9* 3.2*  AST 90* 47*  ALT 136* 103*  ALKPHOS 60 63  BILITOT 0.3 0.5  BILIDIR  --  0.1  IBILI  --  0.4   Sedimentation Rate Recent Labs    09/09/20 0939  ESRSEDRATE 15   C-Reactive Protein Recent Labs    09/09/20 0937  CRP 6.2*    Microbiology: Hx of MSSA on 10/05 Staphylococcus aureus      MIC    CIPROFLOXACIN <=0.5 SENSI... Sensitive    CLINDAMYCIN <=0.25 SENS... Sensitive    ERYTHROMYCIN >=8 RESISTANT  Resistant    GENTAMICIN <=0.5 SENSI... Sensitive    Inducible Clindamycin NEGATIVE  Sensitive    OXACILLIN <=0.25 SENS... Sensitive    RIFAMPIN <=0.5 SENSI... Sensitive    TETRACYCLINE <=1 SENSITIVE  Sensitive    TRIMETH/SULFA <=10 SENSIT... Sensitive    VANCOMYCIN 1 SENSITIVE  Sensitive     Studies/Results: No results found.   Assessment/Plan: 64yo M with right partial knee PJI with MSSA, previously on cefazolin plus  rifampin who developed drug rash plus transaminitis( in the setting of also consuming grapefruit juice). Labs revealed eosinophilia  Drug rash/allergy = will place cefazolin and rifampin on his allergy list; now resolved  MSSA pji = will continue on daptomycin for remaining course through 11/14. We will discontinue rifampin since he had drug induced transaminitis. I have informed Dr Charlann Boxer of the plan  Eosinophilia= anticipate to resolve since abtx offending agents he is no longer taking  transaminitis = now improving and trending down, thought to be due to rifampin side effect  Chest pain with abtx infusion = unclear if his picc  line was cause, but appears that he states that it has completely resolved.  Needs weekly cmp, ck, and cbc, gave him anticipatory guidance for some of the side effects with daptomycin (muscle aches, and rash)  Will have him follow up with Dr Daiva Eves before 11/14 the conclusion of his abtx.   [facility] Cell: [phone] Pager: [phone]  09/11/2020, 3:11 PM

## 2020-09-11 NOTE — Progress Notes (Signed)
OT Cancellation/Screen Note  Patient Details Name: Jesus Bleier Sr. MRN: [id number] DOB: 11/14/1955   Cancelled Treatment:    Reason Eval/Treat Not Completed: OT screened, no needs identified, will sign off (Pt ambulating in room with use of singular crutch, fully dressed, and denied any needs related to OT services. Pt pleasantly declined OT Evaluation and asked for OT to sign off.)  Theodoro Clock 09/11/2020, 11:37 AM

## 2020-09-11 NOTE — Discharge Summary (Signed)
Physician Discharge Summary  Jesus Kotlyar Sr. ZDG:[id number] DOB: 1956-09-02 DOA: 09/09/2020  PCP: Jesus Maw, MD  Admit date: 09/09/2020 Discharge date: 09/11/2020  Time spent: 50 minutes  Recommendations for Outpatient Follow-up:  1. Follow-up in ID clinic, Dr. Tommy Nunez in 2 weeks.  Office will call with appointment time.  2. Follow-up with Dr. Alvan Nunez, orthopedics as previously scheduled. 3. Follow-up with Jesus Maw, MD in 2 weeks.  On follow-up patient will need a comprehensive metabolic profile done to follow-up on electrolytes, renal function, LFTs.    Discharge Diagnoses:  Principal Problem:   Adverse reaction to antibiotic, initial encounter Active Problems:   Drug-induced liver injury   Prediabetes   Discharge Condition: Stable and improved  Diet recommendation: Carb modified diet  Filed Weights   09/09/20 0921 09/09/20 1009  Weight: 111.1 kg 111.6 kg    History of present illness:  HPI per Jesus Nunez This is a 64 year old male with a past medical history of type 2 diabetes, OSA, right medial unicompartmental knee replacement(UKR)which was complicated by anterior knee erythema and treated with a course of cephalexin September 2020 and recent hospitalization from 10/4-10/7 forMSSAright knee jointinfections/p right UKR irrigation and debridement (08/15/20) and discharged on Cefazolin and Rifampin per PICC line who began having nausea, paresthesias, pruritis and fever (103.7F) on 09/08/20 after taking 2 doses of his Cefazolin.Per patient, he was in his usual state of health and recovering at home after his recent surgery when he was to begin his a.m. dose of cefazolin yesterday. Shortly after beginning his infusion he began having symptoms stated above without fever at the time. This lasted about 2 hours. He then took an [place] prior to his afternoon infusion and had recurrence of the symptoms plus fever up to 103.5. Contacted his ID doctor who  recommended hydroxyzine every 6 hours as needed for itching. Patient skipped his evening dose for fear of having recurrence of this reaction. When he awoke this morning he was feeling a bit better and took his a.m. dose of cefazolin with recurrence of symptoms.He reported the symptoms to the on-call ID physician, Jesus Nunez who was concern for a PICC line infection given his high fevers and recommended that he come to the ED.Reported these3doses were from a new boxof antibiotics that he received and also stated that he has been also taking grapefruit seed oil while on the rifampin and cefazolin antibiotics. Took rifampin yesterday but did not have his dose yet today.  ED Course:Afebrile, hemodynamically stable on room air. Notable labs: Na 133, K 6.1, AST 272, ALT 232, T bili 1.3, WBC 5.7, lactic acid 1.1, UA negative. CXR unremarkable. Right knee xray: Small to moderate joint effusion with anterior soft tissue swelling and possibly loosening or infection of the joint. ID and Ortho were consulted. IDinitiallyrecommended changing antibiotics to Daptomycin and Cefepime.   Hospital Course:  1 suspected allergic/adverse reaction to cefazolin and/or rifampin Patient presented with nausea, paresthesias, pruritus, fever associated with IV antibiotics of cefazolin that patient was discharged home on.    Patient was admitted and monitored.  IV antibiotics of cefepime discontinued.  Patient remained afebrile for the rest of the hospitalization.   Concern for adverse reaction to cefazolin and oral rifampin.  Patient also noted to have an elevated LFTs.  LFTs trended down.  Patient improved clinically.  Patient was pancultured with no growth to date by day of discharge.  IV antibiotics were changed to IV daptomycin per ID recommendations.  PICC line was exchanged  per ID recommendations.  ID followed the patient throughout the hospitalization.  Patient will be discharged home in stable and improved  condition.   2.  Drug-induced liver injury Could likely be secondary to rifampin in the setting of grapefruit extract which patient was taking prior to admission.    Remained asymptomatic during the hospitalization.  Antibiotics of rifampin discontinued.  Tylenol held.  LFTs trended down.  Outpatient follow-up.   3.  Prediabetes Hemoglobin A1c 6.3.    Patient's oral hypoglycemic agents were held.  Patient maintained on sliding scale insulin during the hospitalization.  Oral hypoglycemic agents will be resumed on discharge.    4.  Recent MSSA right knee joint infection status post right U KR irrigated and debridement (08/15/2020) Patient remained afebrile during the hospitalization. Patient discharged on cefazolin and rifampin per PICC line however patient presented with nausea, paresthesias, pruritus, fever concern for allergic reaction to antibiotics.  Antibiotics discontinued.  Patient pancultured with no growth to date.  IV antibiotics have been changed to IV daptomycin per ID recommendations.  Patient improved clinically.  Patient will be discharged home on IV daptomycin per ID recommendations to complete course of antibiotic treatment.  Outpatient follow-up with ID and orthopedics.   Procedures:  Chest x-ray 09/09/2020  Plain films of the right knee 09/09/2020  Consultations:  Infectious diseases: Dr. Tommy Nunez 09/09/2020   Discharge Exam: Vitals:   09/10/20 2112 09/11/20 0548  BP: 137/79 (!) 145/77  Pulse: 77 73  Resp: 18 16  Temp: 98.5 F (36.9 C) 97.6 F (36.4 C)  SpO2: 96% 96%    General: NAD Cardiovascular: RRR Respiratory: CTAB Skin: Rash improved.  Discharge Instructions   Discharge Instructions    [facility] pharmacist to adjust dose for Vancomycin, Aminoglycosides and other anti-infective therapies as requested by physician.   Complete by: As directed    [facility] to provide Cath Flo 2mg    Complete by: As directed    Administer for  PICC line occlusion and as ordered by physician for other access device issues.   Anaphylaxis Kit: Provided to treat any anaphylactic reaction to the medication being provided to the patient if First Dose or when requested by physician   Complete by: As directed    Epinephrine 1mg /ml vial / amp: Administer 0.3mg  (0.27ml) subcutaneously once for moderate to severe anaphylaxis, nurse to call physician and pharmacy when reaction occurs and call 911 if needed for immediate care   Diphenhydramine 50mg /ml IV vial: Administer 25-50mg  IV/IM PRN for first dose reaction, rash, itching, mild reaction, nurse to call physician and pharmacy when reaction occurs   Sodium Chloride 0.9% NS 550ml IV: Administer if needed for hypovolemic blood pressure drop or as ordered by physician after call to physician with anaphylactic reaction   Change dressing on IV access line weekly and PRN   Complete by: As directed    Diet Carb Modified   Complete by: As directed    Discharge wound care:   Complete by: As directed    As above   Discharge wound care:   Complete by: As directed    As above   Flush IV access with Sodium Chloride 0.9% and Heparin 10 units/ml or 100 units/ml   Complete by: As directed    Home infusion instructions - [facility]   Complete by: As directed    Instructions: Flush IV access with Sodium Chloride 0.9% and Heparin 10units/ml or 100units/ml   Change dressing on IV access line: Weekly  and PRN   Instructions Cath Flo $Remove'2mg'uJhucxr$ : Administer for PICC Line occlusion and as ordered by physician for other access device   [facility] pharmacist to adjust dose for: Vancomycin, Aminoglycosides and other anti-infective therapies as requested by physician   Increase activity slowly   Complete by: As directed    Method of administration may be changed at the discretion of home infusion pharmacist based upon assessment of the patient and/or caregiver's ability to self-administer the medication  ordered   Complete by: As directed      Allergies as of 09/11/2020      Reactions   Benadryl [diphenhydramine] Other (See Comments)   He cannot recall what happened. Maybe "took too much."      Medication List    STOP taking these medications   ceFAZolin  IVPB Commonly known as: ANCEF   Grapefruit Flavor Oil   rifampin 300 MG capsule Commonly known as: Rifadin     TAKE these medications   aspirin 81 MG chewable tablet Commonly known as: Aspirin Childrens Chew 1 tablet (81 mg total) by mouth 2 (two) times daily. Take for 4 weeks, then resume regular dose.   daptomycin  IVPB Commonly known as: CUBICIN Inject 700 mg into the vein daily for 16 days. Indication:  Prosthetic joint infection First Dose: yes Last Day of Therapy:  09/26/2020 Labs - Once weekly:  CBC/D, BMP, and CPK Labs - Every other week:  ESR and CRP Method of administration: IV Push Method of administration may be changed at the discretion of home infusion pharmacist based upon assessment of the patient and/or caregiver's ability to self-administer the medication ordered.   docusate sodium 100 MG capsule Commonly known as: Colace Take 1 capsule (100 mg total) by mouth 2 (two) times daily.   ferrous sulfate 325 (65 FE) MG tablet Commonly known as: FerrouSul Take 1 tablet (325 mg total) by mouth 3 (three) times daily with meals for 14 days.   Fish Oil Ultra 1400 MG Caps Take 1,400 mg by mouth daily.   HYDROcodone-acetaminophen 7.5-325 MG tablet Commonly known as: Norco Take 1-2 tablets by mouth every 4 (four) hours as needed for moderate pain.   hydrOXYzine 25 MG tablet Commonly known as: ATARAX/VISTARIL Take 1 tablet (25 mg total) by mouth every 6 (six) hours as needed for itching.   Melatonin 5 MG Caps Take 10 mg by mouth at bedtime.   metFORMIN 500 MG 24 hr tablet Commonly known as: GLUCOPHAGE-XR Take 1 tablet (500 mg total) by mouth daily with supper.   methocarbamol 500 MG tablet Commonly  known as: Robaxin Take 1 tablet (500 mg total) by mouth every 6 (six) hours as needed for muscle spasms.   OSTEO BI-FLEX REGULAR STRENGTH PO Take 2 tablets by mouth daily.   OVER THE COUNTER MEDICATION Take 2 capsules by mouth daily. Cinsulin (Cinnamon Chromium Picolinate Vitamin D3)   polyethylene glycol 17 g packet Commonly known as: MIRALAX / GLYCOLAX Take 17 g by mouth 2 (two) times daily. What changed:   when to take this  reasons to take this   TURMERIC PO Take 2,000 mg by mouth daily. 1000 MG/CAPSULE            Discharge Care Instructions  (From admission, onward)         Start     Ordered   09/11/20 0000  Change dressing on IV access line weekly and PRN  (Home infusion instructions - [facility] )  09/11/20 1040   09/11/20 0000  Discharge wound care:       Comments: As above   09/11/20 1242   09/11/20 0000  Discharge wound care:       Comments: As above   09/11/20 1528         Allergies  Allergen Reactions  . Benadryl [Diphenhydramine] Other (See Comments)    He cannot recall what happened. Maybe "took too much."    Follow-up Information    Jesus Maw, MD. Schedule an appointment as soon as possible for a visit in 2 week(s).   Specialty: Family Medicine Contact information: [place] [address] [phone]        Paralee Cancel, MD Follow up.   Specialty: Orthopedic Surgery Why: as previously scheduled Contact information: [address] [place] [address] [phone]        Jesus Nunez, Lavell Islam, MD. Schedule an appointment as soon as possible for a visit in 2 week(s).   Specialty: Infectious Diseases Why: Office will call with appointment time. Contact information: [address]  [address] [phone]                The results of significant diagnostics from this hospitalization (including imaging, microbiology, ancillary and  laboratory) are listed below for reference.    Significant Diagnostic Studies: DG Chest 2 View  Result Date: 09/09/2020 CLINICAL DATA:  Suspected sepsis. Possible PICC infection in the right arm. Temperature to 103.5 last night. Patient does not feel well. EXAM: CHEST - 2 VIEW COMPARISON:  None. FINDINGS: The heart size and mediastinal contours are within normal limits. Both lungs are clear. No pleural effusion or pneumothorax. The visualized skeletal structures are intact. IMPRESSION: No active cardiopulmonary disease. Electronically Signed   By: [name] M.D.   On: 09/09/2020 10:47   DG Knee Complete 4 Views Right  Result Date: 09/09/2020 CLINICAL DATA:  Fever to 103.5 last night. Reported right knee surgery with incision and drainage on 11/16/2019. Partial right knee arthroplasty on 07/29/2019. EXAM: RIGHT KNEE - COMPLETE 4+ VIEW COMPARISON:  None. FINDINGS: No fracture.  No bone lesion. There is a line of lucency adjacent to the tibial component of the medial compartment hemiarthroplasty, which could reflect loosening or infection. The prosthetic components are well aligned. Mild lateral joint space compartment narrowing. Minor marginal spurring from the patella. Small to moderate suprapatellar joint effusion. There is significant anterior knee soft tissue swelling, anterior to the patella and quadriceps and patellar tendons. IMPRESSION: 1. Linear lucency adjacent to the tibial component of the medial compartment hemiarthroplasty. This could reflect loosening or infection. 2. Small to moderate joint effusion with anterior soft tissue swelling. 3. No fractures.  No other acute finding. Electronically Signed   By: [name] M.D.   On: 09/09/2020 10:51   [place] EKG SITE RITE  Result Date: 08/15/2020 If Site Rite image not attached, placement could not be confirmed due to current cardiac rhythm.  [place] EKG SITE RITE  Result Date: 08/15/2020 If Site Rite image not attached, placement could not be  confirmed due to current cardiac rhythm.   Microbiology: Recent Results (from the past 240 hour(s))  Culture, blood (Routine x 2)     Status: None (Preliminary result)   Collection Time: 09/09/20  9:37 AM   Specimen: BLOOD  Result Value Ref Range Status   Specimen Description   Final    BLOOD LEFT ARM Performed at [facility],  [place] [address], [place], [place] [address]    Special Requests   Final    BOTTLES DRAWN AEROBIC AND ANAEROBIC Blood Culture adequate volume Performed at [facility] [address] [place], [place] [address]    Culture   Final    NO GROWTH 2 DAYS Performed at [facility] [address], [place], [place] [address]    Report Status PENDING  Incomplete  Culture, blood (Routine x 2)     Status: None (Preliminary result)   Collection Time: 09/09/20 10:19 AM   Specimen: BLOOD LEFT HAND  Result Value Ref Range Status   Specimen Description   Final    BLOOD LEFT HAND Performed at [facility] [address], [place], [place] [address]    Special Requests   Final    BOTTLES DRAWN AEROBIC AND ANAEROBIC Blood Culture results may not be optimal due to an inadequate volume of blood received in culture bottles Performed at [facility] [address], [place], [place] [address]    Culture   Final    NO GROWTH 2 DAYS Performed at [facility] [address], [place], [place] [address]    Report Status PENDING  Incomplete  Respiratory Panel by RT PCR (Flu A&B, Covid) - Nasopharyngeal Swab     Status: None   Collection Time: 09/09/20 11:34 AM   Specimen: Nasopharyngeal Swab  Result Value Ref Range Status   SARS Coronavirus 2 by RT PCR NEGATIVE NEGATIVE Final    Comment: (NOTE) SARS-CoV-2 target nucleic acids are NOT DETECTED.  The SARS-CoV-2 RNA is generally detectable in upper respiratoy specimens during the acute phase of infection. The lowest concentration of  SARS-CoV-2 viral copies this assay can detect is 131 copies/mL. A negative result does not preclude SARS-Cov-2 infection and should not be used as the sole basis for treatment or other patient management decisions. A negative result may occur with  improper specimen collection/handling, submission of specimen other than nasopharyngeal swab, presence of viral mutation(s) within the areas targeted by this assay, and inadequate number of viral copies (<131 copies/mL). A negative result must be combined with clinical observations, patient history, and epidemiological information. The expected result is Negative.  Fact Sheet for Patients:  PinkCheek.be  Fact Sheet for Healthcare Providers:  GravelBags.it  This test is no t yet approved or cleared by the [place] FDA and  has been authorized for detection and/or diagnosis of SARS-CoV-2 by FDA under an Emergency Use Authorization (EUA). This EUA will remain  in effect (meaning this test can be used) for the duration of the COVID-19 declaration under Section 564(b)(1) of the Act, 21 U.S.C. section 360bbb-3(b)(1), unless the authorization is terminated or revoked sooner.     Influenza A by PCR NEGATIVE NEGATIVE Final   Influenza B by PCR NEGATIVE NEGATIVE Final    Comment: (NOTE) The Xpert Xpress SARS-CoV-2/FLU/RSV assay is intended as an aid in  the diagnosis of influenza from Nasopharyngeal swab specimens and  should not be used as a sole basis for treatment. Nasal washings and  aspirates are unacceptable for Xpert Xpress SARS-CoV-2/FLU/RSV  testing.  Fact Sheet for Patients: PinkCheek.be  Fact Sheet for Healthcare Providers: GravelBags.it  This test is not yet approved or cleared by the [place] FDA and  has been authorized for detection and/or diagnosis of SARS-CoV-2 by  FDA under an Emergency Use  Authorization (EUA). This EUA will remain  in effect (meaning this  test can be used) for the duration of the  Covid-19 declaration under Section 564(b)(1) of the Act, 21  U.S.C. section 360bbb-3(b)(1), unless the authorization is  terminated or revoked. Performed at [facility], [place] [address] [place], [place] [address]   Culture, blood (single)     Status: None (Preliminary result)   Collection Time: 09/09/20 11:41 AM   Specimen: BLOOD  Result Value Ref Range Status   Specimen Description   Final    BLOOD PORTA CATH RIGHT ARM Performed at [facility] [address], [place], [place] [address]    Special Requests   Final    BOTTLES DRAWN AEROBIC AND ANAEROBIC Blood Culture adequate volume Performed at [facility] [address], [place], [place] [address]    Culture   Final    NO GROWTH 2 DAYS Performed at [facility] [address], [place],  [address]    Report Status PENDING  Incomplete     Labs: Basic Metabolic Panel: Recent Labs  Lab 09/09/20 0939 09/10/20 0410 09/11/20 0318  NA 133* 136 137  K 5.1 4.1 4.3  CL 97* 103 102  CO2 $Re'27 25 27  'ePM$ GLUCOSE 138* 129* 131*  BUN 22 25* 24*  CREATININE 1.11 0.88 0.97  CALCIUM 8.9 8.6* 8.9   Liver Function Tests: Recent Labs  Lab 09/09/20 0939 09/10/20 0410 09/11/20 0318  AST 272* 90* 47*  ALT 232* 136* 103*  ALKPHOS 76 60 63  BILITOT 1.3* 0.3 0.5  PROT 7.7 6.4* 6.8  ALBUMIN 3.5 2.9* 3.2*   No results for input(s): LIPASE, AMYLASE in the last 168 hours. No results for input(s): AMMONIA in the last 168 hours. CBC: Recent Labs  Lab 09/09/20 0939 09/10/20 0410 09/11/20 0318  WBC 5.7 5.2 6.6  NEUTROABS 3.2  --  2.6  HGB 14.4 13.0 13.6  HCT 43.9 40.0 42.5  MCV 89.6 90.1 89.9  PLT 168 161 223   Cardiac Enzymes: Recent Labs  Lab 09/10/20 0410  CKTOTAL 38*   BNP: BNP (last 3 results) No results for input(s): BNP in the last 8760  hours.  ProBNP (last 3 results) No results for input(s): PROBNP in the last 8760 hours.  CBG: Recent Labs  Lab 09/10/20 1159 09/10/20 1623 09/10/20 2115 09/11/20 0733 09/11/20 1307  GLUCAP 153* 149* 105* 119* 85       Signed:  Irine Seal MD.  [facility] 09/11/2020, 3:29 PM

## 2020-09-12 ENCOUNTER — Telehealth: Payer: 59 | Admitting: Family Medicine

## 2020-09-12 ENCOUNTER — Telehealth: Payer: Self-pay

## 2020-09-12 NOTE — Telephone Encounter (Signed)
I know.  Have been following his hospital notes.  He may need to come in to see another provider because I will be on medical leave for the next month and a half.

## 2020-09-12 NOTE — Telephone Encounter (Signed)
TCM call attempted. Patient declined to make an appt at this time. He says he has a lot going on & will call us back when he figures things out.

## 2020-09-13 ENCOUNTER — Telehealth (INDEPENDENT_AMBULATORY_CARE_PROVIDER_SITE_OTHER): Payer: 59 | Admitting: Family Medicine

## 2020-09-13 ENCOUNTER — Encounter: Payer: Self-pay | Admitting: Family Medicine

## 2020-09-13 VITALS — Ht 68.0 in

## 2020-09-13 DIAGNOSIS — Z0000 Encounter for general adult medical examination without abnormal findings: Secondary | ICD-10-CM | POA: Diagnosis not present

## 2020-09-13 DIAGNOSIS — E119 Type 2 diabetes mellitus without complications: Secondary | ICD-10-CM

## 2020-09-13 DIAGNOSIS — T50905D Adverse effect of unspecified drugs, medicaments and biological substances, subsequent encounter: Secondary | ICD-10-CM | POA: Diagnosis not present

## 2020-09-13 MED ORDER — METFORMIN HCL ER 500 MG PO TB24: 500.0000 mg | ORAL_TABLET | Freq: Every day | ORAL | 1 refills | Status: DC

## 2020-09-13 NOTE — Progress Notes (Signed)
Established Patient Office Visit  Subjective:  Patient ID: Jesus Nunez., male    DOB: January 18, 1956  Age: 64 y.o. MRN: [id number]  CC:  Chief Complaint  Patient presents with  . Follow-up    Medication follow up visit, Metformin.  Pt has not had Flu vaccine as of yet.    HPI Jesus Pine Sr. 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  Liver enzymes also became elevated.  Fortunately they trended down after about 2 medications were discontinued as well as Tylenol.  They will be followed up today.  Has been taking Metformin for prediabetes without issue.  He has scheduled follow-up with orthopedic surgery in the next few weeks.  He is refusing the Covid vaccine as well as the flu vaccine.  Hemoglobin A1c checked in the hospital was 6.3.  He needs a refill of his Glucophage.  Past Medical History:  Diagnosis Date  . Arthritis    OA  . Diabetes mellitus without complication ([place])    TYPE 2  . History of colon polyps 2015  . Lumbar stress fracture AGE 29   NO SX DONE, HEALED  . 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 He currently does not take nothing for it. He declines meds and is tired.     Past Surgical History:  Procedure Laterality Date  . FRACTURE SURGERY  1978   RIGHT SHOULDER PINNING  . HERNIA REPAIR     UMBILICAL 2683, GROIN FEB 1975  . I & D KNEE WITH POLY EXCHANGE Right 08/15/2020   Procedure: RIGHT UNI COMPARTMENTAL KNEE IRRIGATION AND DEBRIDEMENT KNEE WITH POLY EXCHANGE;  Surgeon: Paralee Cancel, MD;  Location: [facility];  Service: Orthopedics;  Laterality: Right;  . PARTIAL KNEE ARTHROPLASTY Right 07/29/2019   Procedure: UNICOMPARTMENTAL KNEE Medially;  Surgeon: Paralee Cancel, MD;  Location: [facility];  Service: Orthopedics;  Laterality: Right;  90 mins  . RIGHT SHOULDER PINS REMOVED      Family History    Problem Relation Age of Onset  . Hyperlipidemia Mother   . Mental illness Mother     Social History   Socioeconomic History  . Marital status: Married    Spouse name: Not on file  . Number of children: Not on file  . Years of education: Not on file  . Highest education level: Not on file  Occupational History  . Not on file  Tobacco Use  . Smoking status: Never Smoker  . Smokeless tobacco: Never Used  Vaping Use  . Vaping Use: Never used  Substance and Sexual Activity  . Alcohol use: No  . Drug use: No  . Sexual activity: Not on file  Other Topics Concern  . Not on file  Social History Narrative  . Not on file   Social Determinants of Health   Financial Resource Strain:   . Difficulty of Paying Living Expenses: Not on file  Food Insecurity:   . Worried About Charity fundraiser in the Last Year: Not on file  . Ran Out of Food in the Last Year: Not on file  Transportation Needs:   . Lack of Transportation (Medical): Not on file  . Lack of Transportation (Non-Medical): Not on file  Physical Activity:   . Days of Exercise per Week: Not on file  . Minutes of Exercise per Session: Not on  file  Stress:   . Feeling of Stress : Not on file  Social Connections:   . Frequency of Communication with Friends and Family: Not on file  . Frequency of Social Gatherings with Friends and Family: Not on file  . Attends Religious Services: Not on file  . Active Member of Clubs or Organizations: Not on file  . Attends Archivist Meetings: Not on file  . Marital Status: Not on file  Intimate Partner Violence:   . Fear of Current or Ex-Partner: Not on file  . Emotionally Abused: Not on file  . Physically Abused: Not on file  . Sexually Abused: Not on file    Outpatient Medications Prior to Visit  Medication Sig Dispense Refill  . aspirin (ASPIRIN CHILDRENS) 81 MG chewable tablet Chew 1 tablet (81 mg total) by mouth 2 (two) times daily. Take for 4 weeks, then resume  regular dose. 60 tablet 0  . daptomycin (CUBICIN) IVPB Inject 700 mg into the vein daily for 16 days. Indication:  Prosthetic joint infection First Dose: yes Last Day of Therapy:  09/26/2020 Labs - Once weekly:  CBC/D, BMP, and CPK Labs - Every other week:  ESR and CRP Method of administration: IV Push Method of administration may be changed at the discretion of home infusion pharmacist based upon assessment of the patient and/or caregiver's ability to self-administer the medication ordered. 16 Units 0  . docusate sodium (COLACE) 100 MG capsule Take 1 capsule (100 mg total) by mouth 2 (two) times daily. 28 capsule 0  . Omega-3 Fatty Acids (FISH OIL ULTRA) 1400 MG CAPS Take 1,400 mg by mouth daily.    Marland Kitchen OVER THE COUNTER MEDICATION Take 2 capsules by mouth daily. Cinsulin (Cinnamon Chromium Picolinate Vitamin D3)    . TURMERIC PO Take 2,000 mg by mouth daily. 1000 MG/CAPSULE    . metFORMIN (GLUCOPHAGE-XR) 500 MG 24 hr tablet Take 1 tablet (500 mg total) by mouth daily with supper. 90 tablet 0  . ferrous sulfate (FERROUSUL) 325 (65 FE) MG tablet Take 1 tablet (325 mg total) by mouth 3 (three) times daily with meals for 14 days. (Patient not taking: Reported on 09/09/2020) 42 tablet 0  . Glucosamine-Chondroitin (OSTEO BI-FLEX REGULAR STRENGTH PO) Take 2 tablets by mouth daily. (Patient not taking: Reported on 09/13/2020)    . HYDROcodone-acetaminophen (NORCO) 7.5-325 MG tablet Take 1-2 tablets by mouth every 4 (four) hours as needed for moderate pain. (Patient not taking: Reported on 09/13/2020) 60 tablet 0  . hydrOXYzine (ATARAX/VISTARIL) 25 MG tablet Take 1 tablet (25 mg total) by mouth every 6 (six) hours as needed for itching. (Patient not taking: Reported on 09/13/2020) 30 tablet 0  . Melatonin 5 MG CAPS Take 10 mg by mouth at bedtime. (Patient not taking: Reported on 09/13/2020)    . methocarbamol (ROBAXIN) 500 MG tablet Take 1 tablet (500 mg total) by mouth every 6 (six) hours as needed for muscle  spasms. (Patient not taking: Reported on 09/13/2020) 40 tablet 0  . polyethylene glycol (MIRALAX / GLYCOLAX) 17 g packet Take 17 g by mouth 2 (two) times daily. (Patient not taking: Reported on 09/13/2020) 28 packet 0   No facility-administered medications prior to visit.    Allergies  Allergen Reactions  . Cefazolin Shortness Of Breath and Rash    Diffuse rash, chest pain,   . Benadryl [Diphenhydramine] Other (See Comments)    He cannot recall what happened. Maybe "took too much."  . Rifampin  LFT elevations    ROS Review of Systems  Constitutional: Negative.   HENT: Negative.   Eyes: Negative for photophobia and visual disturbance.  Respiratory: Negative.   Cardiovascular: Negative.   Genitourinary: Negative.   Musculoskeletal: Positive for arthralgias and joint swelling.  Neurological: Negative.   Hematological: Does not bruise/bleed easily.  Psychiatric/Behavioral: Negative.       Objective:    Physical Exam Vitals and nursing note reviewed.  Constitutional:      General: He is not in acute distress.    Appearance: Normal appearance. He is not ill-appearing, toxic-appearing or diaphoretic.  HENT:     Head: Normocephalic and atraumatic.     Right Ear: External ear normal.     Left Ear: External ear normal.  Pulmonary:     Effort: Pulmonary effort is normal.  Musculoskeletal:     Right knee: Swelling and laceration present. No erythema.       Legs:  Neurological:     Mental Status: He is alert and oriented to person, place, and time.  Psychiatric:        Mood and Affect: Mood normal.        Behavior: Behavior normal.     Ht 5\' 8"  (1.727 m)   BMI 37.40 kg/m  Wt Readings from Last 3 Encounters:  09/09/20 246 lb (111.6 kg)  08/15/20 252 lb 9.6 oz (114.6 kg)  10/05/19 250 lb (113.4 kg)     Health Maintenance Due  Topic Date Due  . PNEUMOCOCCAL POLYSACCHARIDE VACCINE AGE 1-64 HIGH RISK  Never done  . FOOT EXAM  Never done  . TETANUS/TDAP  Never done   . URINE MICROALBUMIN  10/10/2020    There are no preventive care reminders to display for this patient.  No results found for: TSH Lab Results  Component Value Date   WBC 6.6 09/11/2020   HGB 13.6 09/11/2020   HCT 42.5 09/11/2020   MCV 89.9 09/11/2020   PLT 223 09/11/2020   Lab Results  Component Value Date   NA 137 09/11/2020   K 4.3 09/11/2020   CO2 27 09/11/2020   GLUCOSE 131 (H) 09/11/2020   BUN 24 (H) 09/11/2020   CREATININE 0.97 09/11/2020   BILITOT 0.5 09/11/2020   ALKPHOS 63 09/11/2020   AST 47 (H) 09/11/2020   ALT 103 (H) 09/11/2020   PROT 6.8 09/11/2020   ALBUMIN 3.2 (L) 09/11/2020   CALCIUM 8.9 09/11/2020   ANIONGAP 8 09/11/2020   GFR 84.13 10/11/2019   Lab Results  Component Value Date   CHOL 140 10/11/2019   Lab Results  Component Value Date   HDL 41.70 10/11/2019   Lab Results  Component Value Date   LDLCALC 84 10/11/2019   Lab Results  Component Value Date   TRIG 71.0 10/11/2019   Lab Results  Component Value Date   CHOLHDL 3 10/11/2019   Lab Results  Component Value Date   HGBA1C 6.3 (H) 08/15/2020      Assessment & Plan:   Problem List Items Addressed This Visit      Endocrine   Controlled type 2 diabetes mellitus without complication, without long-term current use of insulin (HCC) - Primary   Relevant Medications   metFORMIN (GLUCOPHAGE-XR) 500 MG 24 hr tablet   Other Relevant Orders   Basic metabolic panel     Other   Healthcare maintenance   Relevant Orders   Lipid panel   Drug reaction   Relevant Orders   CBC   Hepatic  function panel      Meds ordered this encounter  Medications  . metFORMIN (GLUCOPHAGE-XR) 500 MG 24 hr tablet    Sig: Take 1 tablet (500 mg total) by mouth daily with supper.    Dispense:  90 tablet    Refill:  1    Follow-up: Return in 2 weeks and in 3 months to see me..   We will follow-up fasting for today's ordered blood work.  Needs to follow-up with orthopedic surgery for recheck of  his knee.  Needs to come into our clinic to be seen by a provider for follow-up as well.  Strongly encouraged him to have the Covid and the flu vaccines. [name], MD   Virtual Visit via Video Note  I connected with Tramayne Sebesta Sr. on 09/13/20 at 10:30 AM EDT by a video enabled telemedicine application and verified that I am speaking with the correct person using two identifiers.  Location: Patient: home with daughters Provider: home    I discussed the limitations of evaluation and management by telemedicine and the availability of in person appointments. The patient expressed understanding and agreed to proceed.  History of Present Illness:    Observations/Objective:   Assessment and Plan:   Follow Up Instructions:    I discussed the assessment and treatment plan with the patient. The patient was provided an opportunity to ask questions and all were answered. The patient agreed with the plan and demonstrated an understanding of the instructions.   The patient was advised to call back or seek an in-person evaluation if the symptoms worsen or if the condition fails to improve as anticipated.  I provided 30 minutes of non-face-to-face time during this encounter.   [name], MD

## 2020-09-14 ENCOUNTER — Other Ambulatory Visit (INDEPENDENT_AMBULATORY_CARE_PROVIDER_SITE_OTHER): Payer: 59

## 2020-09-14 ENCOUNTER — Other Ambulatory Visit: Payer: Self-pay

## 2020-09-14 DIAGNOSIS — Z0000 Encounter for general adult medical examination without abnormal findings: Secondary | ICD-10-CM

## 2020-09-14 LAB — CULTURE, BLOOD (ROUTINE X 2)
Culture: NO GROWTH
Culture: NO GROWTH
Special Requests: ADEQUATE

## 2020-09-14 LAB — LIPID PANEL
Cholesterol: 281 mg/dL — ABNORMAL HIGH (ref 0–200)
HDL: 71.7 mg/dL (ref >39.00)
LDL Cholesterol: 197 mg/dL — ABNORMAL HIGH (ref 0–99)
NonHDL: 208.94
Total CHOL/HDL Ratio: 4
Triglycerides: 61 mg/dL (ref 0.0–149.0)
VLDL: 12.2 mg/dL (ref 0.0–40.0)

## 2020-09-14 LAB — CULTURE, BLOOD (SINGLE)
Culture: NO GROWTH
Special Requests: ADEQUATE

## 2020-09-19 ENCOUNTER — Ambulatory Visit (INDEPENDENT_AMBULATORY_CARE_PROVIDER_SITE_OTHER): Payer: 59 | Admitting: Infectious Disease

## 2020-09-19 ENCOUNTER — Encounter: Payer: Self-pay | Admitting: Infectious Disease

## 2020-09-19 ENCOUNTER — Other Ambulatory Visit: Payer: Self-pay

## 2020-09-19 ENCOUNTER — Telehealth: Payer: Self-pay

## 2020-09-19 VITALS — BP 128/82 | HR 76 | Wt 241.0 lb

## 2020-09-19 DIAGNOSIS — T8453XD Infection and inflammatory reaction due to internal right knee prosthesis, subsequent encounter: Secondary | ICD-10-CM | POA: Diagnosis not present

## 2020-09-19 DIAGNOSIS — T50905D Adverse effect of unspecified drugs, medicaments and biological substances, subsequent encounter: Secondary | ICD-10-CM

## 2020-09-19 DIAGNOSIS — G4733 Obstructive sleep apnea (adult) (pediatric): Secondary | ICD-10-CM

## 2020-09-19 DIAGNOSIS — Z23 Encounter for immunization: Secondary | ICD-10-CM | POA: Diagnosis not present

## 2020-09-19 DIAGNOSIS — T3695XA Adverse effect of unspecified systemic antibiotic, initial encounter: Secondary | ICD-10-CM

## 2020-09-19 DIAGNOSIS — T361X5A Adverse effect of cephalosporins and other beta-lactam antibiotics, initial encounter: Secondary | ICD-10-CM

## 2020-09-19 DIAGNOSIS — K719 Toxic liver disease, unspecified: Secondary | ICD-10-CM | POA: Diagnosis not present

## 2020-09-19 DIAGNOSIS — E119 Type 2 diabetes mellitus without complications: Secondary | ICD-10-CM

## 2020-09-19 DIAGNOSIS — L271 Localized skin eruption due to drugs and medicaments taken internally: Secondary | ICD-10-CM

## 2020-09-19 DIAGNOSIS — B9561 Methicillin susceptible Staphylococcus aureus infection as the cause of diseases classified elsewhere: Secondary | ICD-10-CM

## 2020-09-19 MED ORDER — DOXYCYCLINE HYCLATE 100 MG PO TABS: 100.0000 mg | ORAL_TABLET | Freq: Two times a day (BID) | ORAL | 11 refills | Status: DC

## 2020-09-19 NOTE — Telephone Encounter (Signed)
Verbal orders given to [facility] at [facility] for CRP, Sed Rate and hepatic panel to be drawn this week and next week in addition to other labs. Ok to pull PICC after last dose 11/16. Patient made aware of plan and oral abx sent to preferred pharmacy.   Tysen Roesler Loyola Mast, RN

## 2020-09-19 NOTE — Progress Notes (Signed)
Subjective:  Chief complaint some itching generalized    Patient ID: Jesus Hyson Sr., male    DOB: Jan 11, 1956, 64 y.o.   MRN: [id number]  HPI   Jesus Nunez. is a 64 y.o. male  medical history of type 2 diabetes, OSA, right medial unicompartmental knee replacement(UKR)which was complicated by anterior knee erythema and treated with a course of cephalexin September 2020 and recent hospitalization from 10/4-10/7 forMSSAright knee jointinfections/p right UKR irrigation and debridement (08/15/20) and poly-exhange by Dr Alvan Dame . He was initially on vancomycin but changed to Cefazolin and Rifampin per PICC line who began having nausea, paresthesias, pruritis and fever (103.23F) on 09/08/20 after taking 2 doses of his Cefazolin. Shortly after beginning his infusion he began having symptoms stated above without fever at the time. This lasted about 2 hours. He then took an [place] prior to his afternoon infusion and had recurrence of the symptoms plus fever up to 103.5. Contacted his ID doctor who recommended hydroxyzine every 6 hours as needed for itching. Patient skipped his evening dose for fear of having recurrence of this reaction. When he awoke this morning he was feeling a bit better and took his a.m. dose of cefazolin with recurrence of symptoms.   I was called by the patient and his wife and I was concerned for PICC Line infection +/- bacteremia and advised him to go to ER.  IN interim he had developed a macular rash with some petechIal aspects on legs, arms, chest and back.  Blood cultures were negative and cefazolin was stopped along with rifampin.  He also had evidence of a drug-induced liver injury.  We switched him over to daptomycin and his rash has been resolving and now is resolved.  His hepatitis also has been resolving when last checked in hospital.  Knee pain is dramatically improved he did walk 5 miles yesterday and it did stiffen up a bit during that time.  But it  otherwise is not hurting like it was before when he was having pain at rest.    Past Medical History:  Diagnosis Date  . Arthritis    OA  . Diabetes mellitus without complication ([place])    TYPE 2  . History of colon polyps 2015  . Lumbar stress fracture AGE 50   NO SX DONE, HEALED  . 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 He currently does not take nothing for it. He declines meds and is tired.     Past Surgical History:  Procedure Laterality Date  . FRACTURE SURGERY  1978   RIGHT SHOULDER PINNING  . HERNIA REPAIR     UMBILICAL 9024, GROIN FEB 1975  . I & D KNEE WITH POLY EXCHANGE Right 08/15/2020   Procedure: RIGHT UNI COMPARTMENTAL KNEE IRRIGATION AND DEBRIDEMENT KNEE WITH POLY EXCHANGE;  Surgeon: Paralee Cancel, MD;  Location: [facility];  Service: Orthopedics;  Laterality: Right;  . PARTIAL KNEE ARTHROPLASTY Right 07/29/2019   Procedure: UNICOMPARTMENTAL KNEE Medially;  Surgeon: Paralee Cancel, MD;  Location: [facility];  Service: Orthopedics;  Laterality: Right;  90 mins  . RIGHT SHOULDER PINS REMOVED      Family History  Problem Relation Age of Onset  . Hyperlipidemia Mother   . Mental illness Mother       Social History   Socioeconomic History  . Marital status: Married    Spouse name: Not on file  . Number of children: Not on file  . Years of education: Not on  file  . Highest education level: Not on file  Occupational History  . Not on file  Tobacco Use  . Smoking status: Never Smoker  . Smokeless tobacco: Never Used  Vaping Use  . Vaping Use: Never used  Substance and Sexual Activity  . Alcohol use: No  . Drug use: No  . Sexual activity: Not on file  Other Topics Concern  . Not on file  Social History Narrative  . Not on file   Social Determinants of Health   Financial Resource Strain:   . Difficulty of Paying Living Expenses: Not on file  Food Insecurity:   . Worried About Charity fundraiser in the Last Year: Not on file  . Ran Out of  Food in the Last Year: Not on file  Transportation Needs:   . Lack of Transportation (Medical): Not on file  . Lack of Transportation (Non-Medical): Not on file  Physical Activity:   . Days of Exercise per Week: Not on file  . Minutes of Exercise per Session: Not on file  Stress:   . Feeling of Stress : Not on file  Social Connections:   . Frequency of Communication with Friends and Family: Not on file  . Frequency of Social Gatherings with Friends and Family: Not on file  . Attends Religious Services: Not on file  . Active Member of Clubs or Organizations: Not on file  . Attends Archivist Meetings: Not on file  . Marital Status: Not on file    Allergies  Allergen Reactions  . Cefazolin Shortness Of Breath and Rash    Diffuse rash, chest pain,   . Benadryl [Diphenhydramine] Other (See Comments)    He cannot recall what happened. Maybe "took too much."  . Rifampin     LFT elevations     Current Outpatient Medications:  .  daptomycin (CUBICIN) IVPB, Inject 700 mg into the vein daily for 16 days. Indication:  Prosthetic joint infection First Dose: yes Last Day of Therapy:  09/26/2020 Labs - Once weekly:  CBC/D, BMP, and CPK Labs - Every other week:  ESR and CRP Method of administration: IV Push Method of administration may be changed at the discretion of home infusion pharmacist based upon assessment of the patient and/or caregiver's ability to self-administer the medication ordered., Disp: 16 Units, Rfl: 0 .  docusate sodium (COLACE) 100 MG capsule, Take 1 capsule (100 mg total) by mouth 2 (two) times daily., Disp: 28 capsule, Rfl: 0 .  ferrous sulfate (FERROUSUL) 325 (65 FE) MG tablet, Take 1 tablet (325 mg total) by mouth 3 (three) times daily with meals for 14 days. (Patient not taking: Reported on 09/09/2020), Disp: 42 tablet, Rfl: 0 .  Glucosamine-Chondroitin (OSTEO BI-FLEX REGULAR STRENGTH PO), Take 2 tablets by mouth daily. (Patient not taking: Reported on 09/13/2020),  Disp: , Rfl:  .  HYDROcodone-acetaminophen (NORCO) 7.5-325 MG tablet, Take 1-2 tablets by mouth every 4 (four) hours as needed for moderate pain. (Patient not taking: Reported on 09/13/2020), Disp: 60 tablet, Rfl: 0 .  hydrOXYzine (ATARAX/VISTARIL) 25 MG tablet, Take 1 tablet (25 mg total) by mouth every 6 (six) hours as needed for itching. (Patient not taking: Reported on 09/13/2020), Disp: 30 tablet, Rfl: 0 .  Melatonin 5 MG CAPS, Take 10 mg by mouth at bedtime. (Patient not taking: Reported on 09/13/2020), Disp: , Rfl:  .  metFORMIN (GLUCOPHAGE-XR) 500 MG 24 hr tablet, Take 1 tablet (500 mg total) by mouth daily with supper., Disp:  90 tablet, Rfl: 1 .  methocarbamol (ROBAXIN) 500 MG tablet, Take 1 tablet (500 mg total) by mouth every 6 (six) hours as needed for muscle spasms. (Patient not taking: Reported on 09/13/2020), Disp: 40 tablet, Rfl: 0 .  Omega-3 Fatty Acids (FISH OIL ULTRA) 1400 MG CAPS, Take 1,400 mg by mouth daily., Disp: , Rfl:  .  OVER THE COUNTER MEDICATION, Take 2 capsules by mouth daily. Cinsulin (Cinnamon Chromium Picolinate Vitamin D3), Disp: , Rfl:  .  polyethylene glycol (MIRALAX / GLYCOLAX) 17 g packet, Take 17 g by mouth 2 (two) times daily. (Patient not taking: Reported on 09/13/2020), Disp: 28 packet, Rfl: 0 .  TURMERIC PO, Take 2,000 mg by mouth daily. 1000 MG/CAPSULE, Disp: , Rfl:   Review of Systems  Constitutional: Negative for chills and fever.  HENT: Negative for congestion and sore throat.   Eyes: Negative for photophobia.  Respiratory: Negative for cough, shortness of breath and wheezing.   Cardiovascular: Negative for chest pain, palpitations and leg swelling.  Gastrointestinal: Negative for abdominal pain, blood in stool, constipation, diarrhea, nausea and vomiting.  Genitourinary: Negative for dysuria, flank pain and hematuria.  Musculoskeletal: Negative for back pain and myalgias.  Skin: Positive for wound. Negative for rash.  Neurological: Negative for  dizziness, weakness and headaches.  Hematological: Does not bruise/bleed easily.  Psychiatric/Behavioral: Negative for agitation, confusion, hallucinations and suicidal ideas.       Objective:   Physical Exam Constitutional:      General: He is not in acute distress.    Appearance: He is well-developed. He is not diaphoretic.  HENT:     Head: Normocephalic and atraumatic.     Mouth/Throat:     Pharynx: No oropharyngeal exudate.  Eyes:     General: No scleral icterus.    Conjunctiva/sclera: Conjunctivae normal.  Cardiovascular:     Rate and Rhythm: Normal rate and regular rhythm.  Pulmonary:     Effort: Pulmonary effort is normal. No respiratory distress.     Breath sounds: No wheezing.  Abdominal:     General: There is no distension.  Musculoskeletal:        General: No tenderness.     Cervical back: Normal range of motion and neck supple.  Skin:    General: Skin is warm and dry.     Coloration: Skin is not pale.     Findings: No erythema or rash.  Neurological:     General: No focal deficit present.     Mental Status: He is alert and oriented to person, place, and time.     Motor: No abnormal muscle tone.     Coordination: Coordination normal.  Psychiatric:        Mood and Affect: Mood normal.        Behavior: Behavior normal.        Thought Content: Thought content normal.        Judgment: Judgment normal.   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  At that point in time we will start  doxycycline twice daily and try to push for at least 1/2-year worth  of therapy.  I did counsel him on potential sun induced phototoxic rash with doxycycline and also other symptoms potentially of nausea or queasiness while taking the antibiotic which can symptoms be aggregated by having the antibiotics taken with food.  Drug rash: This is resolved.  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  For vaccination he wanted influenza vaccine which was given today.

## 2020-09-20 ENCOUNTER — Ambulatory Visit: Payer: 59 | Admitting: Internal Medicine

## 2020-09-25 ENCOUNTER — Inpatient Hospital Stay: Payer: 59 | Admitting: Nurse Practitioner

## 2020-09-26 ENCOUNTER — Encounter: Payer: Self-pay | Admitting: Infectious Disease

## 2020-10-02 ENCOUNTER — Ambulatory Visit: Payer: 59 | Admitting: Internal Medicine

## 2020-10-04 ENCOUNTER — Encounter: Payer: Self-pay | Admitting: Physical Therapy

## 2020-10-04 ENCOUNTER — Ambulatory Visit: Payer: 59 | Attending: Orthopedic Surgery | Admitting: Physical Therapy

## 2020-10-04 ENCOUNTER — Other Ambulatory Visit: Payer: Self-pay

## 2020-10-04 DIAGNOSIS — M25561 Pain in right knee: Secondary | ICD-10-CM | POA: Diagnosis present

## 2020-10-04 DIAGNOSIS — M25661 Stiffness of right knee, not elsewhere classified: Secondary | ICD-10-CM | POA: Diagnosis present

## 2020-10-04 DIAGNOSIS — R2689 Other abnormalities of gait and mobility: Secondary | ICD-10-CM | POA: Diagnosis present

## 2020-10-04 DIAGNOSIS — R600 Localized edema: Secondary | ICD-10-CM | POA: Diagnosis present

## 2020-10-04 DIAGNOSIS — M6281 Muscle weakness (generalized): Secondary | ICD-10-CM | POA: Insufficient documentation

## 2020-10-04 NOTE — Therapy (Addendum)
[facility] [place] [address]  [address] [place], [place], [address] Phone: [phone]   Fax:  [phone]  Physical Therapy Evaluation  Patient Details  Name: Dickie Lalan Drew Sr. MRN: [id number] Date of Birth: 02/14/1956 Referring Provider (PT): Durene RomansMatthew Olin, MD   Encounter Date: 10/04/2020   PT End of Session - 10/04/20 1102    Visit Number 1    Number of Visits 8    Date for PT Re-Evaluation 11/08/20    Authorization Type [employer]    PT Start Time 1102    PT Stop Time 1155    PT Time Calculation (min) 53 min    Activity Tolerance Patient tolerated treatment well    Behavior During Therapy [facility] for tasks assessed/performed           Past Medical History:  Diagnosis Date  . Arthritis    OA  . Diabetes mellitus without complication (HCC)    TYPE 2  . History of colon polyps 2015  . Lumbar stress fracture AGE 45   NO SX DONE, HEALED  . 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 He currently does not take nothing for it. He declines meds and is tired.     Past Surgical History:  Procedure Laterality Date  . FRACTURE SURGERY  1978   RIGHT SHOULDER PINNING  . HERNIA REPAIR     UMBILICAL 2017, GROIN FEB 1975  . I & D KNEE WITH POLY EXCHANGE Right 08/15/2020   Procedure: RIGHT UNI COMPARTMENTAL KNEE IRRIGATION AND DEBRIDEMENT KNEE WITH POLY EXCHANGE;  Surgeon: Durene Romanslin, Matthew, MD;  Location: [facility];  Service: Orthopedics;  Laterality: Right;  . PARTIAL KNEE ARTHROPLASTY Right 07/29/2019   Procedure: UNICOMPARTMENTAL KNEE Medially;  Surgeon: Durene Romanslin, Matthew, MD;  Location: [facility];  Service: Orthopedics;  Laterality: Right;  90 mins  . RIGHT SHOULDER PINS REMOVED      There were no vitals filed for this visit.    Subjective Assessment - 10/04/20 1108    Subjective Pt with h/o R medial unicompartmental knee replacement (UKR) on 07/29/19.  He reports sudden onset of inflammation/erythema in R knee in Sept and found to  have infection in knee joint. Started on antibiotics and ended up having surgery 5 days later (hospitalized 10/4-10/7 for MSSA right knee joint infection with right UKR irrigation and debridement with poly exchange on 08/15/20). He was continued on antibiotics after discharge but developed an adverse reaction and was re-hospitalized 10/30-11/1 for adverse reaction to antibiotic and elevated LFT. He reports he has been told he will be on chronic antibiotics. Since surgery has been gradually increasing activity and was able to walk over 107,000 steps last week. He notes continued stiffness/tightness in R knee esp after periods of inactivity or prolonged sitting or driving and states limited flexion ROM makes it difficult for him to ride his bike.    Limitations Sitting;House hold activities    How long can you sit comfortably? 30 minutes    Patient Stated Goals "to be able to bend my knee more and have more agility in the knee"    Currently in Pain? No/denies    Pain Score 0-No pain   up to 10/10 at times   Pain Location Knee    Pain Orientation Right    Pain Descriptors / Indicators Sharp;Tightness   stiffness   Pain Type Acute pain;Surgical pain    Pain Onset More than a month ago    Pain  Frequency Intermittent    Aggravating Factors  prolonged sitting or inactivity, driving    Pain Relieving Factors ibuprofen PRN    Effect of Pain on Daily Activities stiff getting moving after sitting or driving for longer periods              [facility] PT Assessment - 10/04/20 1102      Assessment   Medical Diagnosis s/p I & D of infected R UKA (MSSA joint infection) with poly exchange    Referring Provider (PT) [name], MD    Onset Date/Surgical Date 08/15/20    Next MD Visit uncertain of date    Prior Therapy PT in Sept/Oct 2020 s/p R UKA      Balance Screen   Has the patient fallen in the past 6 months No    Has the patient had a decrease in activity level because of a fear of falling?  No    Is  the patient reluctant to leave their home because of a fear of falling?  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    Leisure bathroom remodel; working in yard      Cognition   Overall Cognitive Status Within Functional Limits for tasks assessed      ROM / Strength   AROM / PROM / Strength AROM;PROM;Strength      AROM   Right Knee Extension 5    Right Knee Flexion 94      PROM   Right Knee Extension 2    Right Knee Flexion 95      Strength   Strength Assessment Site Hip;Knee;Ankle    Right Hip Flexion 4+/5    Right Hip Extension 4+/5    Right Hip ABduction 4+/5    Right Hip ADduction 4+/5    Left Hip Flexion 5/5    Left Hip Extension 5/5    Left Hip ABduction 5/5    Left Hip ADduction 5/5    Right Knee Flexion 4+/5    Right Knee Extension 4+/5    Left Knee Flexion 5/5    Left Knee Extension 5/5    Right Ankle Dorsiflexion 5/5    Left Ankle Dorsiflexion 5/5      Flexibility   Soft Tissue Assessment /Muscle Length yes    Hamstrings mild tight L>R    Quadriceps mod tight R    ITB mod tight B    Piriformis mild/mod tight B      Palpation   Patella mobility R patella - limited all directions    Palpation comment mild/mod pitting edema t/o R knee upper calf & lower thigh with mottled appearance, increased muscle tension/tightness in quads & ITB      Ambulation/Gait   Ambulation/Gait Assistance 7: Independent    Assistive device None    Gait Pattern Step-through pattern;Decreased hip/knee flexion - right;Decreased weight shift to right;Decreased stance time - right     Ambulation Surface Level;Indoor  Objective measurements completed on examination: See above findings.       [name] Treatment/Exercise - 10/04/20 1102      Self-Care   Self-Care Heat/Ice Application    Heat/Ice Application Encouraged ice and elevation for edema management.      Exercises   Exercises Knee/Hip      Knee/Hip Exercises: Stretches   Quad Stretch Right;30 seconds;3 reps;1 rep    Lobbyist Limitations prone with strap with rolled pillow under knee to target RF    Hip Flexor Stretch Right;30 seconds;1 rep    Hip Flexor Stretch Limitations mod thomas position with strap     ITB Stretch Right;30 seconds;1 rep    ITB Stretch Limitations supine cross-body with strap      Knee/Hip Exercises: Supine   Patellar Mobs Instructed pt in self mobilization for R patella      Manual Therapy   Manual Therapy Joint mobilization    Joint Mobilization R patellar mobs - all directions                  PT Education - 10/04/20 1155    Education Details PT eval findings, anticipated POC, initial HEP & edema management    Person(s) Educated Patient    Methods Explanation;Demonstration;Verbal cues;Handout    Comprehension Verbalized understanding;Verbal cues required;Returned demonstration;Need further instruction               PT Long Term Goals - 10/04/20 1155      PT LONG TERM GOAL #1   Title Patient will be independent with ongoing/advanced HEP for self-management at home    Status New    Target Date 11/08/20      PT LONG TERM GOAL #2   Title R knee AROM >/= 2-120 dg to promote normal gait and stair mechanics    Status New    Target Date 11/08/20      PT LONG TERM GOAL #3   Title Patient will demonstrate improved R LE strength to >/= 5-/5 for improved stability and ease of mobility    Status New    Target Date 11/08/20      PT LONG TERM GOAL #4   Title Patient will ambulate without AD with normal gait pattern on  all surfaces    Status New    Target Date 11/08/20      PT LONG TERM GOAL #5   Title Patient wil negotiate stairs with normal reciprocal step pattern to increase ease of access to all level of home    Status New    Target Date 11/08/20      PT LONG TERM GOAL #6   Title Patient to report ability to perform ADLs, household, and work-related tasks without limitation due to R knee pain or LOM    Status New    Target Date 11/08/20                  Plan - 10/04/20 1155    Clinical Impression    Xzavior is a 64 y/o male who presents to OP PT s/p I & D of infected R unicompartmental knee replacement (MSSA joint infection) with poly exchange on 08/15/20. Initial R medial UKR was on 07/29/19 with good return of function and R knee ROM (AROM 0-124 dg) following OP PT. Since I & D pt reports he will remain on chronic antibiotics but required re-hospitalization 10/30-11/1 for adverse reaction to antibiotic and elevated LFT. Current deficits include intermittent pain, mild/mod pitting  edema t/o R knee upper calf & lower thigh with mottled appearance, limited R knee AROM (5-94)/PROM (2-95), decreased proximal LE flexibility with increased muscle tension/tightness in quads/hip flexors and ITB, mildly decreased R LE strength and abnormal gait pattern. Mattew will benefit from skilled PT intervention to address the above listed deficits, reduce pain, and restore functional ROM and strength to allow for improved knee stability for improved balance and gait to maximize function and mobility.   Personal Factors and Comorbidities Comorbidity 3+;Past/Current Experience;Time since onset of injury/illness/exacerbation    Comorbidities s/p I & D of infected R UKR (MSSA joint infection) with poly exchange - 08/15/20; R knee medial UKR - 07/29/19, OA, DM, h/o lumbar compression fracture, R shoulder fracture s/p pinning, h/o seizures    Examination-Activity Limitations Bend;Caring for Others;Sit;Sleep;Squat;Stairs     Examination-Participation Restrictions Community Activity;Driving;Occupation;Yard Work    Conservation officer, historic buildings Evolving/Moderate complexity    Clinical Decision Making Moderate    Rehab Potential Good    PT Frequency 2x / week    PT Duration 4 weeks    PT Treatment/Interventions ADLs/Self Care Home Management;Cryotherapy;Electrical Stimulation;Gait training;Stair training;Functional mobility training;Therapeutic activities;Therapeutic exercise;Balance training;Neuromuscular re-education;Patient/family education;Manual techniques;Scar mobilization;Passive range of motion;Dry needling;Taping;Vasopneumatic Device;Joint Manipulations;Moist Heat    PT Next Visit Plan Review initial HEP stretches, R LE flexibility & knee ROM, R LE stretching    Consulted and Agree with Plan of Care Patient           Patient will benefit from skilled therapeutic intervention in order to improve the following deficits and impairments:  Abnormal gait, Decreased activity tolerance, Decreased balance, Decreased endurance, Decreased mobility, Decreased range of motion, Decreased safety awareness, Decreased scar mobility, Decreased strength, Difficulty walking, Increased edema, Increased fascial restricitons, Increased muscle spasms, Impaired perceived functional ability, Impaired flexibility, Improper body mechanics, Postural dysfunction, Pain  Visit Diagnosis: Acute pain of right knee - Plan: PT plan of care cert/re-cert  Stiffness of right knee, not elsewhere classified - Plan: PT plan of care cert/re-cert  Localized edema - Plan: PT plan of care cert/re-cert  Muscle weakness (generalized) - Plan: PT plan of care cert/re-cert  Other abnormalities of gait and mobility - Plan: PT plan of care cert/re-cert     Problem List Patient Active Problem List   Diagnosis Date Noted  . Drug reaction   . Adverse reaction to antibiotic, initial encounter 09/09/2020  . 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  Prediabetes 09/09/2020  . Infected right UKR 08/15/2020  . Morbid obesity (HCC) 10/05/2019  . S/P right UKR 07/29/2019  . Healthcare maintenance 07/09/2018  . Benign prostatic hyperplasia with urinary frequency 07/09/2018  . Controlled type 2 diabetes mellitus without complication, without long-term current use of insulin (HCC) 07/09/2018    Marry Guan, PT, MPT 10/04/2020, 1:28 PM  [facility] [address]  [address] [place], [place], [address] Phone: [phone]   Fax:  [phone]  Name: Derreck Wiltsey Sr. MRN: [id number] Date of Birth: 02-08-1956

## 2020-10-04 NOTE — Patient Instructions (Signed)
    Home exercise program created by Braylea Brancato, PT.  For questions, please contact Demari Kropp via phone at [phone] or email at [email or web address]  [facility] [place] [address]  [address] [place], [place], [address] Phone: [phone]   Fax:  [phone]    

## 2020-10-09 ENCOUNTER — Other Ambulatory Visit: Payer: Self-pay

## 2020-10-10 ENCOUNTER — Encounter: Payer: Self-pay | Admitting: Family

## 2020-10-10 ENCOUNTER — Ambulatory Visit (INDEPENDENT_AMBULATORY_CARE_PROVIDER_SITE_OTHER): Payer: 59 | Admitting: Family

## 2020-10-10 VITALS — BP 132/76 | HR 79 | Temp 97.6°F | Ht 68.0 in | Wt 240.0 lb

## 2020-10-10 DIAGNOSIS — Z0000 Encounter for general adult medical examination without abnormal findings: Secondary | ICD-10-CM | POA: Diagnosis not present

## 2020-10-10 NOTE — Progress Notes (Signed)
Established Patient Office Visit  Subjective:  Patient ID: Jesus Nagy., male    DOB: 20-Jan-1956  Age: 64 y.o. MRN: [id number]  CC:  Chief Complaint  Patient presents with  . Annual Exam    CPE, no concerns.     HPI Jesus Fant Sr. presents for CPE with no concerns. Patient had a colonoscopy that was normal. Has to return in 7 years for a repeat. Exercises daily by walking several mile. Married. Works daily. Recently has a knee surgery with complications but recovering better now  Past Medical History:  Diagnosis Date  . Arthritis    OA  . Diabetes mellitus without complication (HCC)    TYPE 2  . History of colon polyps 2015  . Lumbar stress fracture AGE 18   NO SX DONE, HEALED  . 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 He currently does not take nothing for it. He declines meds and is tired.     Past Surgical History:  Procedure Laterality Date  . FRACTURE SURGERY  1978   RIGHT SHOULDER PINNING  . HERNIA REPAIR     UMBILICAL 2017, GROIN FEB 1975  . I & D KNEE WITH POLY EXCHANGE Right 08/15/2020   Procedure: RIGHT UNI COMPARTMENTAL KNEE IRRIGATION AND DEBRIDEMENT KNEE WITH POLY EXCHANGE;  Surgeon: [name], MD;  Location: [facility];  Service: Orthopedics;  Laterality: Right;  . PARTIAL KNEE ARTHROPLASTY Right 07/29/2019   Procedure: UNICOMPARTMENTAL KNEE Medially;  Surgeon: [name], MD;  Location: [facility];  Service: Orthopedics;  Laterality: Right;  90 mins  . RIGHT SHOULDER PINS REMOVED      Family History  Problem Relation Age of Onset  . Hyperlipidemia Mother   . Mental illness Mother     Social History   Socioeconomic History  . Marital status: Married    Spouse name: Not on file  . Number of children: Not on file  . Years of education: Not on file  . Highest education level: Not on file  Occupational History  . Not on file  Tobacco Use  . Smoking status: Never Smoker  . Smokeless tobacco: Never Used  Vaping Use  . Vaping Use:  Never used  Substance and Sexual Activity  . Alcohol use: No  . Drug use: No  . Sexual activity: Not on file  Other Topics Concern  . Not on file  Social History Narrative  . Not on file   Social Determinants of Health   Financial Resource Strain:   . Difficulty of Paying Living Expenses: Not on file  Food Insecurity:   . Worried About Programme researcher, broadcasting/film/video in the Last Year: Not on file  . Ran Out of Food in the Last Year: Not on file  Transportation Needs:   . Lack of Transportation (Medical): Not on file  . Lack of Transportation (Non-Medical): Not on file  Physical Activity:   . Days of Exercise per Week: Not on file  . Minutes of Exercise per Session: Not on file  Stress:   . Feeling of Stress : Not on file  Social Connections:   . Frequency of Communication with Friends and Family: Not on file  . Frequency of Social Gatherings with Friends and Family: Not on file  . Attends Religious Services: Not on file  . Active Member of Clubs or Organizations: Not on file  . Attends Banker Meetings: Not on file  . Marital Status: Not on file  Intimate Partner Violence:   . Fear of Current or Ex-Partner: Not on file  . Emotionally Abused: Not on file  . Physically Abused: Not on file  . Sexually Abused: Not on file    Outpatient Medications Prior to Visit  Medication Sig Dispense Refill  . aspirin (ASPIRIN ADULT LOW DOSE) 81 MG EC tablet Take 81 mg by mouth daily. Swallow whole.    Marland Kitchen doxycycline (VIBRA-TABS) 100 MG tablet Take 1 tablet (100 mg total) by mouth 2 (two) times daily. 60 tablet 11  . Glucosamine-Chondroitin (OSTEO BI-FLEX REGULAR STRENGTH PO) Take 2 tablets by mouth daily.     Marland Kitchen ibuprofen (ADVIL) 200 MG tablet Take 200 mg by mouth every 6 (six) hours as needed.    . Melatonin 5 MG CAPS Take 10 mg by mouth at bedtime.     . metFORMIN (GLUCOPHAGE-XR) 500 MG 24 hr tablet Take 1 tablet (500 mg total) by mouth daily with supper. 90 tablet 1  . Omega-3 Fatty  Acids (FISH OIL ULTRA) 1400 MG CAPS Take 1,400 mg by mouth daily.    Marland Kitchen OVER THE COUNTER MEDICATION Take 2 capsules by mouth daily. Cinsulin (Cinnamon Chromium Picolinate Vitamin D3)    . TURMERIC PO Take 2,000 mg by mouth daily. 1000 MG/CAPSULE    . docusate sodium (COLACE) 100 MG capsule Take 1 capsule (100 mg total) by mouth 2 (two) times daily. (Patient not taking: Reported on 09/19/2020) 28 capsule 0  . ferrous sulfate (FERROUSUL) 325 (65 FE) MG tablet Take 1 tablet (325 mg total) by mouth 3 (three) times daily with meals for 14 days. (Patient not taking: Reported on 09/09/2020) 42 tablet 0  . hydrOXYzine (ATARAX/VISTARIL) 25 MG tablet Take 1 tablet (25 mg total) by mouth every 6 (six) hours as needed for itching. (Patient not taking: Reported on 09/13/2020) 30 tablet 0  . methocarbamol (ROBAXIN) 500 MG tablet Take 1 tablet (500 mg total) by mouth every 6 (six) hours as needed for muscle spasms. (Patient not taking: Reported on 09/13/2020) 40 tablet 0  . polyethylene glycol (MIRALAX / GLYCOLAX) 17 g packet Take 17 g by mouth 2 (two) times daily. (Patient not taking: Reported on 10/10/2020) 28 packet 0  . HYDROcodone-acetaminophen (NORCO) 7.5-325 MG tablet Take 1-2 tablets by mouth every 4 (four) hours as needed for moderate pain. (Patient not taking: Reported on 09/13/2020) 60 tablet 0   No facility-administered medications prior to visit.    Allergies  Allergen Reactions  . Cefazolin Shortness Of Breath and Rash    Diffuse rash, chest pain,   . Benadryl [Diphenhydramine] Other (See Comments)    He cannot recall what happened. Maybe "took too much."  . Rifampin     LFT elevations    ROS Review of Systems  All other systems reviewed and are negative.     Objective:    Physical Exam Vitals reviewed.  Constitutional:      Appearance: Normal appearance. He is normal weight.  HENT:     Head: Normocephalic and atraumatic.     Right Ear: Tympanic membrane, ear canal and external ear  normal.     Left Ear: Tympanic membrane, ear canal and external ear normal.     Nose: Nose normal.     Mouth/Throat:     Mouth: Mucous membranes are dry.  Cardiovascular:     Rate and Rhythm: Normal rate and regular rhythm.     Pulses: Normal pulses.     Heart sounds: Normal heart sounds.  Pulmonary:     Effort: Pulmonary effort is normal.     Breath sounds: Normal breath sounds.  Abdominal:     General: Abdomen is flat.     Palpations: Abdomen is soft.  Genitourinary:    Penis: Normal.   Musculoskeletal:        General: Normal range of motion.     Cervical back: Normal range of motion and neck supple.  Skin:    General: Skin is warm and dry.  Neurological:     General: No focal deficit present.     Mental Status: He is alert and oriented to person, place, and time.     BP 132/76   Pulse 79   Temp 97.6 F (36.4 C) (Tympanic)   Ht 5\' 8"  (1.727 m)   Wt 240 lb (108.9 kg)   SpO2 95%   BMI 36.49 kg/m  Wt Readings from Last 3 Encounters:  10/10/20 240 lb (108.9 kg)  09/19/20 241 lb (109.3 kg)  09/09/20 246 lb (111.6 kg)     Health Maintenance Due  Topic Date Due  . PNEUMOCOCCAL POLYSACCHARIDE VACCINE AGE 52-64 HIGH RISK  Never done  . FOOT EXAM  Never done  . TETANUS/TDAP  Never done  . URINE MICROALBUMIN  10/10/2020    There are no preventive care reminders to display for this patient.  No results found for: TSH Lab Results  Component Value Date   WBC 6.6 09/11/2020   HGB 13.6 09/11/2020   HCT 42.5 09/11/2020   MCV 89.9 09/11/2020   PLT 223 09/11/2020   Lab Results  Component Value Date   NA 137 09/11/2020   K 4.3 09/11/2020   CO2 27 09/11/2020   GLUCOSE 131 (H) 09/11/2020   BUN 24 (H) 09/11/2020   CREATININE 0.97 09/11/2020   BILITOT 0.5 09/11/2020   ALKPHOS 63 09/11/2020   AST 47 (H) 09/11/2020   ALT 103 (H) 09/11/2020   PROT 6.8 09/11/2020   ALBUMIN 3.2 (L) 09/11/2020   CALCIUM 8.9 09/11/2020   ANIONGAP 8 09/11/2020   GFR 84.13 10/11/2019    Lab Results  Component Value Date   CHOL 281 (H) 09/14/2020   Lab Results  Component Value Date   HDL 71.70 09/14/2020   Lab Results  Component Value Date   LDLCALC 197 (H) 09/14/2020   Lab Results  Component Value Date   TRIG 61.0 09/14/2020   Lab Results  Component Value Date   CHOLHDL 4 09/14/2020   Lab Results  Component Value Date   HGBA1C 6.3 (H) 08/15/2020      Assessment & Plan:   Problem List Items Addressed This Visit    None    Visit Diagnoses    Routine adult health maintenance    -  Primary      No orders of the defined types were placed in this encounter.   Follow-up: No follow-ups on file.    10/15/2020, FNP

## 2020-10-10 NOTE — Patient Instructions (Signed)
Exercising to Stay Healthy To become healthy and stay healthy, it is recommended that you do moderate-intensity and vigorous-intensity exercise. You can tell that you are exercising at a moderate intensity if your heart starts beating faster and you start breathing faster but can still hold a conversation. You can tell that you are exercising at a vigorous intensity if you are breathing much harder and faster and cannot hold a conversation while exercising. Exercising regularly is important. It has many health benefits, such as:  Improving overall fitness, flexibility, and endurance.  Increasing bone density.  Helping with weight control.  Decreasing body fat.  Increasing muscle strength.  Reducing stress and tension.  Improving overall health. How often should I exercise? Choose an activity that you enjoy, and set realistic goals. Your health care provider can help you make an activity plan that works for you. Exercise regularly as told by your health care provider. This may include:  Doing strength training two times a week, such as: ? Lifting weights. ? Using resistance bands. ? Push-ups. ? Sit-ups. ? Yoga.  Doing a certain intensity of exercise for a given amount of time. Choose from these options: ? A total of 150 minutes of moderate-intensity exercise every week. ? A total of 75 minutes of vigorous-intensity exercise every week. ? A mix of moderate-intensity and vigorous-intensity exercise every week. Children, pregnant women, people who have not exercised regularly, people who are overweight, and older adults may need to talk with a health care provider about what activities are safe to do. If you have a medical condition, be sure to talk with your health care provider before you start a new exercise program. What are some exercise ideas? Moderate-intensity exercise ideas include:  Walking 1 mile (1.6 km) in about 15  minutes.  Biking.  Hiking.  Golfing.  Dancing.  Water aerobics. Vigorous-intensity exercise ideas include:  Walking 4.5 miles (7.2 km) or more in about 1 hour.  Jogging or running 5 miles (8 km) in about 1 hour.  Biking 10 miles (16.1 km) or more in about 1 hour.  Lap swimming.  Roller-skating or in-line skating.  Cross-country skiing.  Vigorous competitive sports, such as football, basketball, and soccer.  Jumping rope.  Aerobic dancing. What are some everyday activities that can help me to get exercise?  Yard work, such as: ? Pushing a lawn mower. ? Raking and bagging leaves.  Washing your car.  Pushing a stroller.  Shoveling snow.  Gardening.  Washing windows or floors. How can I be more active in my day-to-day activities?  Use stairs instead of an elevator.  Take a walk during your lunch break.  If you drive, park your car farther away from your work or school.  If you take public transportation, get off one stop early and walk the rest of the way.  Stand up or walk around during all of your indoor phone calls.  Get up, stretch, and walk around every 30 minutes throughout the day.  Enjoy exercise with a friend. Support to continue exercising will help you keep a regular routine of activity. What guidelines can I follow while exercising?  Before you start a new exercise program, talk with your health care provider.  Do not exercise so much that you hurt yourself, feel dizzy, or get very short of breath.  Wear comfortable clothes and wear shoes with good support.  Drink plenty of water while you exercise to prevent dehydration or heat stroke.  Work out until your breathing   and your heartbeat get faster. Where to find more information  [place] Department of Health and Human Services: www.hhs.gov  Centers for Disease Control and Prevention (CDC): www.cdc.gov Summary  Exercising regularly is important. It will improve your overall fitness,  flexibility, and endurance.  Regular exercise also will improve your overall health. It can help you control your weight, reduce stress, and improve your bone density.  Do not exercise so much that you hurt yourself, feel dizzy, or get very short of breath.  Before you start a new exercise program, talk with your health care provider. This information is not intended to replace advice given to you by your health care provider. Make sure you discuss any questions you have with your health care provider. Document Revised: 10/10/2017 Document Reviewed: 09/18/2017 Elsevier Patient Education  2020 Elsevier Inc.  

## 2020-10-18 ENCOUNTER — Ambulatory Visit: Payer: 59 | Attending: Orthopedic Surgery | Admitting: Physical Therapy

## 2020-10-18 ENCOUNTER — Encounter: Payer: Self-pay | Admitting: Physical Therapy

## 2020-10-18 ENCOUNTER — Other Ambulatory Visit: Payer: Self-pay

## 2020-10-18 DIAGNOSIS — M25661 Stiffness of right knee, not elsewhere classified: Secondary | ICD-10-CM | POA: Diagnosis present

## 2020-10-18 DIAGNOSIS — M25561 Pain in right knee: Secondary | ICD-10-CM | POA: Diagnosis not present

## 2020-10-18 DIAGNOSIS — R600 Localized edema: Secondary | ICD-10-CM | POA: Insufficient documentation

## 2020-10-18 DIAGNOSIS — M6281 Muscle weakness (generalized): Secondary | ICD-10-CM | POA: Insufficient documentation

## 2020-10-18 DIAGNOSIS — R2689 Other abnormalities of gait and mobility: Secondary | ICD-10-CM | POA: Insufficient documentation

## 2020-10-18 NOTE — Therapy (Signed)
[facility] Outpatient Rehabilitation [facility] [address]  [address] [place], [place], [address] Phone: [phone]   Fax:  [phone]  Physical Therapy Treatment  Patient Details  Name: Jesus Gange Sr. MRN: [id number] Date of Birth: 1956-07-08 Referring Provider (PT): [name], MD   Encounter Date: 10/18/2020   PT End of Session - 10/18/20 1018    Visit Number 2    Number of Visits 8    Date for PT Re-Evaluation 11/08/20    Authorization Type [employer]    PT Start Time 1018    PT Stop Time 1112    PT Time Calculation (min) 54 min    Activity Tolerance Patient tolerated treatment well    Behavior During Therapy [facility] for tasks assessed/performed           Past Medical History:  Diagnosis Date  . Arthritis    OA  . Diabetes mellitus without complication (HCC)    TYPE 2  . History of colon polyps 2015  . Lumbar stress fracture AGE 64   NO SX DONE, HEALED  . 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 He currently does not take nothing for it. He declines meds and is tired.     Past Surgical History:  Procedure Laterality Date  . FRACTURE SURGERY  1978   RIGHT SHOULDER PINNING  . HERNIA REPAIR     UMBILICAL 2017, GROIN FEB 1975  . I & D KNEE WITH POLY EXCHANGE Right 08/15/2020   Procedure: RIGHT UNI COMPARTMENTAL KNEE IRRIGATION AND DEBRIDEMENT KNEE WITH POLY EXCHANGE;  Surgeon: [name], MD;  Location: [facility];  Service: Orthopedics;  Laterality: Right;  . PARTIAL KNEE ARTHROPLASTY Right 07/29/2019   Procedure: UNICOMPARTMENTAL KNEE Medially;  Surgeon: [name], MD;  Location: [facility];  Service: Orthopedics;  Laterality: Right;  90 mins  . RIGHT SHOULDER PINS REMOVED      There were no vitals filed for this visit.   Subjective Assessment - 10/18/20 1023    Subjective Pt reports he has been working on his bathroom remodel himself recently as well as walking over 103,000 steps last week so he has been very busy - has not been  fully consistent with HEP.    Currently in Pain? Yes    Pain Score 2    1-2/10 normally but up to 7-8/10 when he "tweaks" the knee   Pain Location Knee    Pain Orientation Right    Pain Descriptors / Indicators Discomfort    Pain Type Acute pain;Surgical pain    Pain Frequency Intermittent                             [name] Treatment/Exercise - 10/18/20 1018      Exercises   Exercises Knee/Hip      Knee/Hip Exercises: Aerobic   Recumbent Bike Rocking to intermittent full revolutions x 6 min      Knee/Hip Exercises: Standing   Heel Raises Both;10 reps;5 seconds    Heel Raises Limitations + quad/glute set    Terminal Knee Extension Right;10 reps;Strengthening   5" hold   Terminal Knee Extension Limitations TKE with ball into wall    Functional Squat 10 reps;5 seconds    Functional Squat Limitations counter squat      Knee/Hip Exercises: Supine   Short Arc Quad Sets Right;10 reps;Strengthening    Short Arc Quad Sets Limitations 2#, 8"  FR    Bridges Both;10 reps;Strengthening   5" hold   Straight Leg Raises Right;10 reps;Strengthening    Straight Leg Raises Limitations 2#, cues to initiate lift with quad set    Knee Extension Right;10 reps;Strengthening    Knee Extension Limitations isometric quad/glute set with heel pressing into peanut ball    Knee Flexion Both;10 reps;2 sets;AROM;AAROM    Knee Flexion Limitations HS curls with heels on peanut ball; 2nd set + strap to encourage stetch for increased flexion AROM      Vasopneumatic   Number Minutes Vasopneumatic  10 minutes    Vasopnuematic Location  Knee   R   Vasopneumatic Pressure Low    Vasopneumatic Temperature  34                       PT Long Term Goals - 10/18/20 1026      PT LONG TERM GOAL #1   Title Patient will be independent with ongoing/advanced HEP for self-management at home    Status On-going    Target Date 11/08/20      PT LONG TERM GOAL #2   Title R knee AROM  >/= 2-120 dg to promote normal gait and stair mechanics    Status On-going    Target Date 11/08/20      PT LONG TERM GOAL #3   Title Patient will demonstrate improved R LE strength to >/= 5-/5 for improved stability and ease of mobility    Status On-going    Target Date 11/08/20      PT LONG TERM GOAL #4   Title Patient will ambulate without AD with normal gait pattern on all surfaces    Status On-going    Target Date 11/08/20      PT LONG TERM GOAL #5   Title Patient wil negotiate stairs with normal reciprocal step pattern to increase ease of access to all level of home    Status On-going    Target Date 11/08/20      PT LONG TERM GOAL #6   Title Patient to report ability to perform ADLs, household, and work-related tasks without limitation due to R knee pain or LOM    Status On-going    Target Date 11/08/20                 Plan - 10/18/20 1027    Clinical Impression Statement Nussen remains very active, walking over 103,000 steps and completing plumbing for his bathroom remodel. Increased old bruising apparent in anterior knee but no signs/symptoms of infection. Due to busy schedule, he admits to limited performance of HEP stretches but denies need for review. Therapeutic exercises focusing on increasing R knee AROM and quad control/LE strength with pt cautioned to avoid forcing knee into painful motions. Session concluded with vasopnuematic compression to reduce post-exercise pain and edema.    Comorbidities s/p I & D of infected R UKR (MSSA joint infection) with poly exchange - 08/15/20; R knee medial UKR - 07/29/19, OA, DM, h/o lumbar compression fracture, R shoulder fracture s/p pinning, h/o seizures    PT Frequency 2x / week    PT Duration 4 weeks    PT Treatment/Interventions ADLs/Self Care Home Management;Cryotherapy;Electrical Stimulation;Gait training;Stair training;Functional mobility training;Therapeutic activities;Therapeutic exercise;Balance training;Neuromuscular  re-education;Patient/family education;Manual techniques;Scar mobilization;Passive range of motion;Dry needling;Taping;Vasopneumatic Device;Joint Manipulations;Moist Heat    PT Next Visit Plan R LE flexibility & knee ROM, R LE stretching    Consulted and Agree with Plan of  Care Patient           Patient will benefit from skilled therapeutic intervention in order to improve the following deficits and impairments:  Abnormal gait, Decreased activity tolerance, Decreased balance, Decreased endurance, Decreased mobility, Decreased range of motion, Decreased safety awareness, Decreased scar mobility, Decreased strength, Difficulty walking, Increased edema, Increased fascial restricitons, Increased muscle spasms, Impaired perceived functional ability, Impaired flexibility, Improper body mechanics, Postural dysfunction, Pain  Visit Diagnosis: Acute pain of right knee  Stiffness of right knee, not elsewhere classified  Localized edema  Muscle weakness (generalized)  Other abnormalities of gait and mobility     Problem List Patient Active Problem List   Diagnosis Date Noted  . Drug reaction   . Adverse reaction to antibiotic, initial encounter 09/09/2020  . 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 Prediabetes 09/09/2020  . Infected right UKR 08/15/2020  . Morbid obesity (HCC) 10/05/2019  . S/P right UKR 07/29/2019  . Healthcare maintenance 07/09/2018  . Benign prostatic hyperplasia with urinary frequency 07/09/2018  . Controlled type 2 diabetes mellitus without complication, without long-term current use of insulin (HCC) 07/09/2018    Marry Guan, PT, MPT 10/18/2020, 1:50 PM  [facility] [address]  [address] [place], [place], [address] Phone: [phone]   Fax:  [phone]  Name: Curley Hogen Sr. MRN: [id number] Date of Birth: [birth year]

## 2020-10-23 ENCOUNTER — Ambulatory Visit: Payer: 59

## 2020-10-23 ENCOUNTER — Other Ambulatory Visit: Payer: Self-pay

## 2020-10-23 DIAGNOSIS — M25561 Pain in right knee: Secondary | ICD-10-CM | POA: Diagnosis not present

## 2020-10-23 DIAGNOSIS — R600 Localized edema: Secondary | ICD-10-CM

## 2020-10-23 DIAGNOSIS — M25661 Stiffness of right knee, not elsewhere classified: Secondary | ICD-10-CM

## 2020-10-23 DIAGNOSIS — M6281 Muscle weakness (generalized): Secondary | ICD-10-CM

## 2020-10-23 DIAGNOSIS — R2689 Other abnormalities of gait and mobility: Secondary | ICD-10-CM

## 2020-10-23 NOTE — Therapy (Signed)
[facility] Outpatient Rehabilitation [facility] [address]  [address] [place], [place], [address] Phone: [phone]   Fax:  [phone]  Physical Therapy Treatment  Patient Details  Name: Jesus Barren Sr. MRN: [id number] Date of Birth: [birth year] Referring Provider (PT): [name], MD   Encounter Date: 10/23/2020   PT End of Session - 10/23/20 1133    Visit Number 3    Number of Visits 8    Date for PT Re-Evaluation 11/08/20    Authorization Type [employer]    PT Start Time 1105    PT Stop Time 1159    PT Time Calculation (min) 54 min    Activity Tolerance Patient tolerated treatment well    Behavior During Therapy [facility] for tasks assessed/performed           Past Medical History:  Diagnosis Date  . Arthritis    OA  . Diabetes mellitus without complication (HCC)    TYPE 2  . History of colon polyps 2015  . Lumbar stress fracture AGE 63   NO SX DONE, HEALED  . 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 He currently does not take nothing for it. He declines meds and is tired.     Past Surgical History:  Procedure Laterality Date  . FRACTURE SURGERY  1978   RIGHT SHOULDER PINNING  . HERNIA REPAIR     UMBILICAL 2017, GROIN FEB 1975  . I & D KNEE WITH POLY EXCHANGE Right 08/15/2020   Procedure: RIGHT UNI COMPARTMENTAL KNEE IRRIGATION AND DEBRIDEMENT KNEE WITH POLY EXCHANGE;  Surgeon: [name], MD;  Location: [facility];  Service: Orthopedics;  Laterality: Right;  . PARTIAL KNEE ARTHROPLASTY Right 07/29/2019   Procedure: UNICOMPARTMENTAL KNEE Medially;  Surgeon: [name], MD;  Location: [facility];  Service: Orthopedics;  Laterality: Right;  90 mins  . RIGHT SHOULDER PINS REMOVED      There were no vitals filed for this visit.   Subjective Assessment - 10/23/20 1114    Subjective Pt. noting his R knee has been "popping" without pain.    Patient Stated Goals "to be able to bend my knee more and have more agility in the knee"     Currently in Pain? Yes    Pain Score 3     Pain Location Knee    Pain Orientation Right    Pain Descriptors / Indicators Discomfort    Pain Type Acute pain;Surgical pain    Pain Onset More than a month ago    Pain Frequency Intermittent    Aggravating Factors  prolonged sitting    Pain Relieving Factors ibuprofen    Multiple Pain Sites No                             [name] Treatment/Exercise - 10/23/20 0001      Knee/Hip Exercises: Stretches   Passive Hamstring Stretch Right;1 rep;30 seconds    Passive Hamstring Stretch Limitations supine with strap '    Hip Flexor Stretch Right;30 seconds;1 rep    Hip Flexor Stretch Limitations mod thomas position with strap     Knee: Self-Stretch Limitations 5" x 15    ITB Stretch Right;30 seconds;1 rep    ITB Stretch Limitations supine cross-body with strap      Knee/Hip Exercises: Aerobic   Recumbent Bike Rocking to intermittent full revolutions x 6 min      Knee/Hip Exercises:  Standing   Heel Raises Both;15 reps    Heel Raises Limitations cues for quad set      Knee/Hip Exercises: Seated   Long Arc Quad Right;10 reps    Long Arc Quad Weight 2 lbs.    Long Arc Quad Limitations TKE      Knee/Hip Exercises: Supine   Straight Leg Raises Right;10 reps;Strengthening    Straight Leg Raises Limitations 2# - cues for slow pace      Vasopneumatic   Number Minutes Vasopneumatic  10 minutes    Vasopnuematic Location  Knee   R   Vasopneumatic Pressure Low    Vasopneumatic Temperature  34                  PT Education - 10/23/20 1202    Education Details HEP update    Person(s) Educated Patient    Methods Explanation;Demonstration;Verbal cues;Handout    Comprehension Verbalized understanding;Returned demonstration;Verbal cues required               PT Long Term Goals - 10/18/20 1026      PT LONG TERM GOAL #1   Title Patient will be independent with ongoing/advanced HEP for self-management at  home    Status On-going    Target Date 11/08/20      PT LONG TERM GOAL #2   Title R knee AROM >/= 2-120 dg to promote normal gait and stair mechanics    Status On-going    Target Date 11/08/20      PT LONG TERM GOAL #3   Title Patient will demonstrate improved R LE strength to >/= 5-/5 for improved stability and ease of mobility    Status On-going    Target Date 11/08/20      PT LONG TERM GOAL #4   Title Patient will ambulate without AD with normal gait pattern on all surfaces    Status On-going    Target Date 11/08/20      PT LONG TERM GOAL #5   Title Patient wil negotiate stairs with normal reciprocal step pattern to increase ease of access to all level of home    Status On-going    Target Date 11/08/20      PT LONG TERM GOAL #6   Title Patient to report ability to perform ADLs, household, and work-related tasks without limitation due to R knee pain or LOM    Status On-going    Target Date 11/08/20                 Plan - 10/23/20 1134    Clinical Impression Statement Pt. with no new complaints.  Progressed knee flexion ROM and LE strengthening with 6" step-ups today without issue.  Pt. did require cueing to avoid "pushing too hard" into LE stretching.  HEP updated.  Ended visit with ice/compression to R knee to reduce post-exercise swelling and pain.    Comorbidities s/p I & D of infected R UKR (MSSA joint infection) with poly exchange - 08/15/20; R knee medial UKR - 07/29/19, OA, DM, h/o lumbar compression fracture, R shoulder fracture s/p pinning, h/o seizures    Rehab Potential Good    PT Frequency 2x / week    PT Duration 4 weeks    PT Treatment/Interventions ADLs/Self Care Home Management;Cryotherapy;Electrical Stimulation;Gait training;Stair training;Functional mobility training;Therapeutic activities;Therapeutic exercise;Balance training;Neuromuscular re-education;Patient/family education;Manual techniques;Scar mobilization;Passive range of motion;Dry  needling;Taping;Vasopneumatic Device;Joint Manipulations;Moist Heat    PT Next Visit Plan R LE flexibility & knee ROM, R LE  stretching    Consulted and Agree with Plan of Care Patient           Patient will benefit from skilled therapeutic intervention in order to improve the following deficits and impairments:  Abnormal gait,Decreased activity tolerance,Decreased balance,Decreased endurance,Decreased mobility,Decreased range of motion,Decreased safety awareness,Decreased scar mobility,Decreased strength,Difficulty walking,Increased edema,Increased fascial restricitons,Increased muscle spasms,Impaired perceived functional ability,Impaired flexibility,Improper body mechanics,Postural dysfunction,Pain  Visit Diagnosis: Acute pain of right knee  Stiffness of right knee, not elsewhere classified  Localized edema  Muscle weakness (generalized)  Other abnormalities of gait and mobility     Problem List Patient Active Problem List   Diagnosis Date Noted  . Drug reaction   . Adverse reaction to antibiotic, initial encounter 09/09/2020  . 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 Prediabetes 09/09/2020  . Infected right UKR 08/15/2020  . Morbid obesity (HCC) 10/05/2019  . S/P right UKR 07/29/2019  . Healthcare maintenance 07/09/2018  . Benign prostatic hyperplasia with urinary frequency 07/09/2018  . Controlled type 2 diabetes mellitus without complication, without long-term current use of insulin (HCC) 07/09/2018    Kermit Balo, PTA 10/23/20 12:09 PM   [facility] Outpatient Rehabilitation [facility] [address]  [address] [place], [place], [address] Phone: [phone]   Fax:  [phone]  Name: Jesus Desroches Sr. MRN: [id number] Date of Birth: [birth year]

## 2020-10-24 ENCOUNTER — Encounter: Payer: 59 | Admitting: Family

## 2020-10-26 ENCOUNTER — Other Ambulatory Visit: Payer: Self-pay

## 2020-10-26 ENCOUNTER — Encounter: Payer: Self-pay | Admitting: Physical Therapy

## 2020-10-26 ENCOUNTER — Ambulatory Visit: Payer: 59 | Admitting: Physical Therapy

## 2020-10-26 DIAGNOSIS — M25661 Stiffness of right knee, not elsewhere classified: Secondary | ICD-10-CM

## 2020-10-26 DIAGNOSIS — R2689 Other abnormalities of gait and mobility: Secondary | ICD-10-CM

## 2020-10-26 DIAGNOSIS — R600 Localized edema: Secondary | ICD-10-CM

## 2020-10-26 DIAGNOSIS — M25561 Pain in right knee: Secondary | ICD-10-CM | POA: Diagnosis not present

## 2020-10-26 DIAGNOSIS — M6281 Muscle weakness (generalized): Secondary | ICD-10-CM

## 2020-10-26 NOTE — Therapy (Signed)
[facility] Outpatient Rehabilitation [facility] [address]  [address] [place], [place], [address] Phone: [phone]   Fax:  [phone]  Physical Therapy Treatment  Patient Details  Name: Jesus Nunez. MRN: [id number] Date of Birth: [birth year] Referring Provider (PT): [name], MD   Encounter Date: 10/26/2020   PT End of Session - 10/26/20 1020    Visit Number 4    Number of Visits 8    Date for PT Re-Evaluation 11/08/20    Authorization Type [employer]    PT Start Time 1020    PT Stop Time 1111    PT Time Calculation (min) 51 min    Activity Tolerance Patient tolerated treatment well    Behavior During Therapy [facility] for tasks assessed/performed           Past Medical History:  Diagnosis Date  . Arthritis    OA  . Diabetes mellitus without complication (HCC)    TYPE 2  . History of colon polyps 2015  . Lumbar stress fracture AGE 64   NO SX DONE, HEALED  . 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 He currently does not take nothing for it. He declines meds and is tired.     Past Surgical History:  Procedure Laterality Date  . FRACTURE SURGERY  1978   RIGHT SHOULDER PINNING  . HERNIA REPAIR     UMBILICAL 2017, GROIN FEB 1975  . I & D KNEE WITH POLY EXCHANGE Right 08/15/2020   Procedure: RIGHT UNI COMPARTMENTAL KNEE IRRIGATION AND DEBRIDEMENT KNEE WITH POLY EXCHANGE;  Surgeon: [name], MD;  Location: [facility];  Service: Orthopedics;  Laterality: Right;  . PARTIAL KNEE ARTHROPLASTY Right 07/29/2019   Procedure: UNICOMPARTMENTAL KNEE Medially;  Surgeon: [name], MD;  Location: [facility];  Service: Orthopedics;  Laterality: Right;  90 mins  . RIGHT SHOULDER PINS REMOVED      There were no vitals filed for this visit.   Subjective Assessment - 10/26/20 1023    Subjective Pt reports he fell on Monday when he missed a step on R going backward lowering something down the stairs and states his knee has been "jacked up" since. Has  has to start taking hydrocodone again for pain. Has not contacted MD.    Patient Stated Goals "to be able to bend my knee more and have more agility in the knee"    Currently in Pain? Yes    Pain Score 3     Pain Location Knee    Pain Orientation Right;Anterior;Lateral    Pain Descriptors / Indicators Sharp    Pain Type Acute pain;Surgical pain    Pain Onset In the past 7 days   exacerbation following fall on the stairs on Monday             [facility] PT Assessment - 10/26/20 1020      Assessment   Next MD Visit 11/08/20      AROM   Right Knee Extension 2   after patellar & joint mobs   Right Knee Flexion 95                         [name] Treatment/Exercise - 10/26/20 1020      Exercises   Exercises Knee/Hip      Knee/Hip Exercises: Supine   Knee Flexion Both;20 reps;AAROM;Right;AROM    Knee Flexion Limitations HS curls with heels on peanut ball +  strap to encourage stretch for increased flexion AROM      Vasopneumatic   Number Minutes Vasopneumatic  10 minutes    Vasopnuematic Location  Knee   R   Vasopneumatic Pressure High    Vasopneumatic Temperature  34      Manual Therapy   Manual Therapy Joint mobilization;Soft tissue mobilization    Joint Mobilization R patellar mobs - all directions; R tibiofemoral A/P & P/A for knee flexion & extension ROM    Soft tissue mobilization STM/DTM to quads & ITB;  retrograde massage for edema                       PT Long Term Goals - 10/18/20 1026      PT LONG TERM GOAL #1   Title Patient will be independent with ongoing/advanced HEP for self-management at home    Status On-going    Target Date 11/08/20      PT LONG TERM GOAL #2   Title R knee AROM >/= 2-120 dg to promote normal gait and stair mechanics    Status On-going    Target Date 11/08/20      PT LONG TERM GOAL #3   Title Patient will demonstrate improved R LE strength to >/= 5-/5 for improved stability and ease of mobility     Status On-going    Target Date 11/08/20      PT LONG TERM GOAL #4   Title Patient will ambulate without AD with normal gait pattern on all surfaces    Status On-going    Target Date 11/08/20      PT LONG TERM GOAL #5   Title Patient wil negotiate stairs with normal reciprocal step pattern to increase ease of access to all level of home    Status On-going    Target Date 11/08/20      PT LONG TERM GOAL #6   Title Patient to report ability to perform ADLs, household, and work-related tasks without limitation due to R knee pain or LOM    Status On-going    Target Date 11/08/20                 Plan - 10/26/20 1101    Clinical Impression Statement Sajad reports he jarred his R knee on Monday when he was sliding something down the stairs while stepping backward and misjudged what step he was on missing the next step with his R foot and sliding off the next 2 steps before landing hard on the floor. He denies falling on his knee but has had increased pain and bruising/swelling since, especially in lateral knee. R knee ROM more guarded but extension ROM improved following manual joint mobs and STM however flexion ROM still limited to 95. Session concluded with vasopnuematic compression to promote further reduction in inflammation and edema with pt encouraged to ice and elevate knee more at home and to contact MD if pain does not continue to improve.    Comorbidities s/p I & D of infected R UKR (MSSA joint infection) with poly exchange - 08/15/20; R knee medial UKR - 07/29/19, OA, DM, h/o lumbar compression fracture, R shoulder fracture s/p pinning, h/o seizures    Rehab Potential Good    PT Frequency 2x / week    PT Duration 4 weeks    PT Treatment/Interventions ADLs/Self Care Home Management;Cryotherapy;Electrical Stimulation;Gait training;Stair training;Functional mobility training;Therapeutic activities;Therapeutic exercise;Balance training;Neuromuscular re-education;Patient/family  education;Manual techniques;Scar mobilization;Passive range of motion;Dry needling;Taping;Vasopneumatic Device;Joint Manipulations;Moist  Heat    PT Next Visit Plan R LE flexibility & knee ROM, R LE stretching    Consulted and Agree with Plan of Care Patient           Patient will benefit from skilled therapeutic intervention in order to improve the following deficits and impairments:  Abnormal gait,Decreased activity tolerance,Decreased balance,Decreased endurance,Decreased mobility,Decreased range of motion,Decreased safety awareness,Decreased scar mobility,Decreased strength,Difficulty walking,Increased edema,Increased fascial restricitons,Increased muscle spasms,Impaired perceived functional ability,Impaired flexibility,Improper body mechanics,Postural dysfunction,Pain  Visit Diagnosis: Acute pain of right knee  Stiffness of right knee, not elsewhere classified  Localized edema  Muscle weakness (generalized)  Other abnormalities of gait and mobility     Problem List Patient Active Problem List   Diagnosis Date Noted  . Drug reaction   . Adverse reaction to antibiotic, initial encounter 09/09/2020  . 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 Prediabetes 09/09/2020  . Infected right UKR 08/15/2020  . Morbid obesity (HCC) 10/05/2019  . S/P right UKR 07/29/2019  . Healthcare maintenance 07/09/2018  . Benign prostatic hyperplasia with urinary frequency 07/09/2018  . Controlled type 2 diabetes mellitus without complication, without long-term current use of insulin (HCC) 07/09/2018    Marry Guan, PT, MPT 10/26/2020, 12:30 PM  [facility] [address]  [address] [place], [place], [address] Phone: [phone]   Fax:  [phone]  Name: Torres Hardenbrook Nunez. MRN: [id number] Date of Birth: [birth year]

## 2020-10-30 ENCOUNTER — Other Ambulatory Visit: Payer: Self-pay

## 2020-10-30 ENCOUNTER — Ambulatory Visit: Payer: 59

## 2020-10-30 DIAGNOSIS — M25561 Pain in right knee: Secondary | ICD-10-CM | POA: Diagnosis not present

## 2020-10-30 DIAGNOSIS — M6281 Muscle weakness (generalized): Secondary | ICD-10-CM

## 2020-10-30 DIAGNOSIS — R600 Localized edema: Secondary | ICD-10-CM

## 2020-10-30 DIAGNOSIS — M25661 Stiffness of right knee, not elsewhere classified: Secondary | ICD-10-CM

## 2020-10-30 DIAGNOSIS — R2689 Other abnormalities of gait and mobility: Secondary | ICD-10-CM

## 2020-10-30 NOTE — Therapy (Signed)
[facility] Outpatient Rehabilitation [facility] [address]  [address] [place], [place], [address] Phone: [phone]   Fax:  [phone]  Physical Therapy Treatment  Patient Details  Name: Jesus Covington Sr. MRN: [id number] Date of Birth: 09-15-1956 Referring Provider (PT): [name], MD   Encounter Date: 10/30/2020   PT End of Session - 10/30/20 1033    Visit Number 5    Number of Visits 8    Date for PT Re-Evaluation 11/08/20    Authorization Type [employer]    PT Start Time 1019    PT Stop Time 1107    PT Time Calculation (min) 48 min    Activity Tolerance Patient tolerated treatment well    Behavior During Therapy [facility] for tasks assessed/performed           Past Medical History:  Diagnosis Date  . Arthritis    OA  . Diabetes mellitus without complication (HCC)    TYPE 2  . History of colon polyps 2015  . Lumbar stress fracture AGE 25   NO SX DONE, HEALED  . 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 He currently does not take nothing for it. He declines meds and is tired.     Past Surgical History:  Procedure Laterality Date  . FRACTURE SURGERY  1978   RIGHT SHOULDER PINNING  . HERNIA REPAIR     UMBILICAL 2017, GROIN FEB 1975  . I & D KNEE WITH POLY EXCHANGE Right 08/15/2020   Procedure: RIGHT UNI COMPARTMENTAL KNEE IRRIGATION AND DEBRIDEMENT KNEE WITH POLY EXCHANGE;  Surgeon: [name], MD;  Location: [facility];  Service: Orthopedics;  Laterality: Right;  . PARTIAL KNEE ARTHROPLASTY Right 07/29/2019   Procedure: UNICOMPARTMENTAL KNEE Medially;  Surgeon: [name], MD;  Location: [facility];  Service: Orthopedics;  Laterality: Right;  90 mins  . RIGHT SHOULDER PINS REMOVED      There were no vitals filed for this visit.   Subjective Assessment - 10/30/20 1030    Subjective Pt. noting much improved swelling, and R knee pain since Friday.  Still has not contacted MD regarding his fall.    Patient Stated Goals "to be able to bend my  knee more and have more agility in the knee"    Currently in Pain? Yes    Pain Score 1     Pain Location Knee    Pain Orientation Right;Anterior;Lateral    Pain Descriptors / Indicators Sharp    Pain Type Acute pain;Surgical pain    Multiple Pain Sites No              [facility] PT Assessment - 10/30/20 0001      AROM   Right/Left Knee Right    Right Knee Flexion 102      PROM   Right/Left Knee Right    Right Knee Flexion 108                         [name] Treatment/Exercise - 10/30/20 0001      Knee/Hip Exercises: Stretches   Passive Hamstring Stretch Right;1 rep;30 seconds    Passive Hamstring Stretch Limitations supine with strap '    Hip Flexor Stretch Right;30 seconds;1 rep    Hip Flexor Stretch Limitations mod thomas position with strap     Knee: Self-Stretch Limitations 5" x 15    ITB Stretch Right;30 seconds;1 rep    ITB Stretch Limitations supine  cross-body with strap      Knee/Hip Exercises: Aerobic   Nustep Lvl 4, 6 min (UE/LE)      Knee/Hip Exercises: Standing   Functional Squat 15 reps;3 seconds    Functional Squat Limitations depth to pt. tolerance and knee flexion stretch      Knee/Hip Exercises: Supine   Knee Flexion Both;AAROM;Right;AROM;10 reps    Knee Flexion Limitations HS curls with heels on peanut ball + strap to encourage stretch for increased flexion AROM      Vasopneumatic   Number Minutes Vasopneumatic  10 minutes    Vasopnuematic Location  Knee   R   Vasopneumatic Pressure High    Vasopneumatic Temperature  34      Manual Therapy   Manual Therapy Joint mobilization    Joint Mobilization R patellar mobs - all directions; R tibiofemoral A/P & P/A for knee flexion & extension ROM                       PT Long Term Goals - 10/18/20 1026      PT LONG TERM GOAL #1   Title Patient will be independent with ongoing/advanced HEP for self-management at home    Status On-going    Target Date 11/08/20       PT LONG TERM GOAL #2   Title R knee AROM >/= 2-120 dg to promote normal gait and stair mechanics    Status On-going    Target Date 11/08/20      PT LONG TERM GOAL #3   Title Patient will demonstrate improved R LE strength to >/= 5-/5 for improved stability and ease of mobility    Status On-going    Target Date 11/08/20      PT LONG TERM GOAL #4   Title Patient will ambulate without AD with normal gait pattern on all surfaces    Status On-going    Target Date 11/08/20      PT LONG TERM GOAL #5   Title Patient wil negotiate stairs with normal reciprocal step pattern to increase ease of access to all level of home    Status On-going    Target Date 11/08/20      PT LONG TERM GOAL #6   Title Patient to report ability to perform ADLs, household, and work-related tasks without limitation due to R knee pain or LOM    Status On-going    Target Date 11/08/20                 Plan - 10/30/20 1034    Clinical Impression Statement Freida Busman reporting good improvement in R knee pain and swelling since last visit.  Has been able to stop his hydrocodone since painful flare-up on Friday/Saturday.  Progressed MT and therex for improved R knee flexion ROM with AROM progressing to 102 dg, PROM 108 dg.  Pt. continues with R lateral knee discoloration/bruising which has not changed sig since last visit.  Pt. denies contacting the MD since the fall down outdoor stairs despite encouragement from therapist to do so.  Will continue to monitor knee status over next few visits and progress per pt. response.  Ended visit with ice/compression to R knee to reduce post-exercise swelling and pain.    Comorbidities s/p I & D of infected R UKR (MSSA joint infection) with poly exchange - 08/15/20; R knee medial UKR - 07/29/19, OA, DM, h/o lumbar compression fracture, R shoulder fracture s/p pinning, h/o seizures  Rehab Potential Good    PT Frequency 2x / week    PT Duration 4 weeks    PT Treatment/Interventions  ADLs/Self Care Home Management;Cryotherapy;Electrical Stimulation;Gait training;Stair training;Functional mobility training;Therapeutic activities;Therapeutic exercise;Balance training;Neuromuscular re-education;Patient/family education;Manual techniques;Scar mobilization;Passive range of motion;Dry needling;Taping;Vasopneumatic Device;Joint Manipulations;Moist Heat    PT Next Visit Plan R LE flexibility & knee ROM, R LE stretching    Consulted and Agree with Plan of Care Patient           Patient will benefit from skilled therapeutic intervention in order to improve the following deficits and impairments:  Abnormal gait,Decreased activity tolerance,Decreased balance,Decreased endurance,Decreased mobility,Decreased range of motion,Decreased safety awareness,Decreased scar mobility,Decreased strength,Difficulty walking,Increased edema,Increased fascial restricitons,Increased muscle spasms,Impaired perceived functional ability,Impaired flexibility,Improper body mechanics,Postural dysfunction,Pain  Visit Diagnosis: Acute pain of right knee  Stiffness of right knee, not elsewhere classified  Localized edema  Muscle weakness (generalized)  Other abnormalities of gait and mobility     Problem List Patient Active Problem List   Diagnosis Date Noted  . Drug reaction   . Adverse reaction to antibiotic, initial encounter 09/09/2020  . 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 Prediabetes 09/09/2020  . Infected right UKR 08/15/2020  . Morbid obesity (HCC) 10/05/2019  . S/P right UKR 07/29/2019  . Healthcare maintenance 07/09/2018  . Benign prostatic hyperplasia with urinary frequency 07/09/2018  . Controlled type 2 diabetes mellitus without complication, without long-term current use of insulin (HCC) 07/09/2018    Kermit Balo, PTA 10/30/20 11:17 AM   [facility] Outpatient Rehabilitation [facility] [address]  [address] [place], [place], [address] Phone:  [phone]   Fax:  [phone]  Name: Jesus Hoos Sr. MRN: [id number] Date of Birth: May 24, 1956

## 2020-11-01 ENCOUNTER — Other Ambulatory Visit: Payer: Self-pay

## 2020-11-01 ENCOUNTER — Ambulatory Visit: Payer: 59

## 2020-11-01 DIAGNOSIS — M25561 Pain in right knee: Secondary | ICD-10-CM | POA: Diagnosis not present

## 2020-11-01 DIAGNOSIS — M25661 Stiffness of right knee, not elsewhere classified: Secondary | ICD-10-CM

## 2020-11-01 DIAGNOSIS — R2689 Other abnormalities of gait and mobility: Secondary | ICD-10-CM

## 2020-11-01 DIAGNOSIS — M6281 Muscle weakness (generalized): Secondary | ICD-10-CM

## 2020-11-01 DIAGNOSIS — R600 Localized edema: Secondary | ICD-10-CM

## 2020-11-01 NOTE — Therapy (Signed)
[name] Outpatient Rehabilitation [name] [address]  [address] [place], [place], [address] Phone: [phone]   Fax:  [phone]  Physical Therapy Treatment  Patient Details  Name: Jesus Lowder Sr. MRN: [id number] Date of Birth: 10-Mar-1956 Referring Provider (PT): [name], MD   Encounter Date: 11/01/2020   PT End of Session - 11/01/20 1031    Visit Number 6    Number of Visits 8    Date for PT Re-Evaluation 11/08/20    Authorization Type [employer]    PT Start Time 1018    PT Stop Time 1108    PT Time Calculation (min) 50 min    Activity Tolerance Patient tolerated treatment well    Behavior During Therapy [name] for tasks assessed/performed           Past Medical History:  Diagnosis Date  . Arthritis    OA  . Diabetes mellitus without complication (HCC)    TYPE 2  . History of colon polyps 2015  . Lumbar stress fracture AGE 41   NO SX DONE, HEALED  . 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 He currently does not take nothing for it. He declines meds and is tired.     Past Surgical History:  Procedure Laterality Date  . FRACTURE SURGERY  1978   RIGHT SHOULDER PINNING  . HERNIA REPAIR     UMBILICAL 2017, GROIN FEB 1975  . I & D KNEE WITH POLY EXCHANGE Right 08/15/2020   Procedure: RIGHT UNI COMPARTMENTAL KNEE IRRIGATION AND DEBRIDEMENT KNEE WITH POLY EXCHANGE;  Surgeon: [name], MD;  Location: [facility];  Service: Orthopedics;  Laterality: Right;  . PARTIAL KNEE ARTHROPLASTY Right 07/29/2019   Procedure: UNICOMPARTMENTAL KNEE Medially;  Surgeon: [name], MD;  Location: [facility];  Service: Orthopedics;  Laterality: Right;  90 mins  . RIGHT SHOULDER PINS REMOVED      There were no vitals filed for this visit.   Subjective Assessment - 11/01/20 1029    Subjective Pt. reporting he felt he needed to take a hydrocodone this morning due to R anterior shin pain.    Patient Stated Goals "to be able to bend my knee more and  have more agility in the knee"    Currently in Pain? Yes    Pain Score 1     Pain Location Knee    Pain Orientation Right;Anterior;Lateral    Pain Descriptors / Indicators Sharp    Pain Type Acute pain;Surgical pain    Pain Radiating Towards into anterior shin    Pain Onset 1 to 4 weeks ago              [name] PT Assessment - 11/01/20 0001      AROM   Right/Left Knee Right    Right Knee Flexion 104      PROM   Right/Left Knee Right    Right Knee Flexion 112                         [name] Treatment/Exercise - 11/01/20 0001      Knee/Hip Exercises: Stretches   Passive Hamstring Stretch Right;1 rep;30 seconds    Passive Hamstring Stretch Limitations manual with therapist    Lobbyist Right;2 reps;30 seconds    Quad Stretch Limitations 1st set with strap; 2nd set with strap and bolster    Hip Flexor Stretch Right;30 seconds;3 reps    Hip  Flexor Stretch Limitations mod thomas position with strap     ITB Stretch Right;30 seconds;1 rep    ITB Stretch Limitations manual with therapist    Piriformis Stretch Right;2 reps;30 seconds    Piriformis Stretch Limitations manual with therapist      Knee/Hip Exercises: Aerobic   Nustep Lvl 5, 6 min (UE/LE)      Vasopneumatic   Number Minutes Vasopneumatic  10 minutes    Vasopnuematic Location  Knee   R   Vasopneumatic Pressure High    Vasopneumatic Temperature  34      Manual Therapy   Manual Therapy Joint mobilization    Joint Mobilization R patellar mobs - all directions; R tibiofemoral A/P & P/A for knee flexion & extension ROM   limited R patellar mobility                      PT Long Term Goals - 10/18/20 1026      PT LONG TERM GOAL #1   Title Patient will be independent with ongoing/advanced HEP for self-management at home    Status On-going    Target Date 11/08/20      PT LONG TERM GOAL #2   Title R knee AROM >/= 2-120 dg to promote normal gait and stair mechanics    Status  On-going    Target Date 11/08/20      PT LONG TERM GOAL #3   Title Patient will demonstrate improved R LE strength to >/= 5-/5 for improved stability and ease of mobility    Status On-going    Target Date 11/08/20      PT LONG TERM GOAL #4   Title Patient will ambulate without AD with normal gait pattern on all surfaces    Status On-going    Target Date 11/08/20      PT LONG TERM GOAL #5   Title Patient wil negotiate stairs with normal reciprocal step pattern to increase ease of access to all level of home    Status On-going    Target Date 11/08/20      PT LONG TERM GOAL #6   Title Patient to report ability to perform ADLs, household, and work-related tasks without limitation due to R knee pain or LOM    Status On-going    Target Date 11/08/20                 Plan - 11/01/20 1205    Clinical Impression Statement [name] reporting continued improvement in R knee pain since falling last week.  No visible increased in swelling or bruising today and does continues to deny need to contact MD.  Pt. able to demo progress toward LTG #2 with improving flexion AROM/PROM.  Does demo limited R patellar mobility which improved somewhat along with flexion ROM after ITB/glute/quad/HS stretching.  Ended visit with ice/compression to R knee to reduce post-exercise swelling.    Comorbidities s/p I & D of infected R UKR (MSSA joint infection) with poly exchange - 08/15/20; R knee medial UKR - 07/29/19, OA, DM, h/o lumbar compression fracture, R shoulder fracture s/p pinning, h/o seizures    Rehab Potential Good    PT Frequency 2x / week    PT Duration 4 weeks    PT Treatment/Interventions ADLs/Self Care Home Management;Cryotherapy;Electrical Stimulation;Gait training;Stair training;Functional mobility training;Therapeutic activities;Therapeutic exercise;Balance training;Neuromuscular re-education;Patient/family education;Manual techniques;Scar mobilization;Passive range of motion;Dry  needling;Taping;Vasopneumatic Device;Joint Manipulations;Moist Heat    PT Next Visit Plan R LE flexibility & knee  ROM, R LE stretching    Consulted and Agree with Plan of Care Patient           Patient will benefit from skilled therapeutic intervention in order to improve the following deficits and impairments:  Abnormal gait,Decreased activity tolerance,Decreased balance,Decreased endurance,Decreased mobility,Decreased range of motion,Decreased safety awareness,Decreased scar mobility,Decreased strength,Difficulty walking,Increased edema,Increased fascial restricitons,Increased muscle spasms,Impaired perceived functional ability,Impaired flexibility,Improper body mechanics,Postural dysfunction,Pain  Visit Diagnosis: Acute pain of right knee  Stiffness of right knee, not elsewhere classified  Localized edema  Muscle weakness (generalized)  Other abnormalities of gait and mobility     Problem List Patient Active Problem List   Diagnosis Date Noted  . Drug reaction   . Adverse reaction to antibiotic, initial encounter 09/09/2020  . 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 Prediabetes 09/09/2020  . Infected right UKR 08/15/2020  . Morbid obesity (HCC) 10/05/2019  . S/P right UKR 07/29/2019  . Healthcare maintenance 07/09/2018  . Benign prostatic hyperplasia with urinary frequency 07/09/2018  . Controlled type 2 diabetes mellitus without complication, without long-term current use of insulin (HCC) 07/09/2018    Kermit Balo, PTA 11/01/20 12:11 PM   [facility] Outpatient Rehabilitation [facility] [address]  [address] [place], [place], [address] Phone: [phone]   Fax:  [phone]  Name: Jesus Capp Sr. MRN: [id number] Date of Birth: [birth year]

## 2020-11-06 ENCOUNTER — Other Ambulatory Visit: Payer: Self-pay

## 2020-11-06 ENCOUNTER — Ambulatory Visit: Payer: 59

## 2020-11-06 DIAGNOSIS — R2689 Other abnormalities of gait and mobility: Secondary | ICD-10-CM

## 2020-11-06 DIAGNOSIS — M25561 Pain in right knee: Secondary | ICD-10-CM | POA: Diagnosis not present

## 2020-11-06 DIAGNOSIS — M6281 Muscle weakness (generalized): Secondary | ICD-10-CM

## 2020-11-06 DIAGNOSIS — R600 Localized edema: Secondary | ICD-10-CM

## 2020-11-06 DIAGNOSIS — M25661 Stiffness of right knee, not elsewhere classified: Secondary | ICD-10-CM

## 2020-11-06 NOTE — Therapy (Addendum)
[facility] [address]  [place], [place], [address] Phone: [phone]   Fax:  [phone]  Physical Therapy Treatment / Discharge Summary  Patient Details  Name: Jesus Reichart Sr. MRN: [id number] Date of Birth: 1956-10-20 Referring Provider (PT): Paralee Cancel, MD   Encounter Date: 11/06/2020   PT End of Session - 11/06/20 1034    Visit Number 7    Number of Visits 8    Date for PT Re-Evaluation 11/08/20    Authorization Type [employer]    PT Start Time 1022    PT Stop Time 1110    PT Time Calculation (min) 48 min    Activity Tolerance Patient tolerated treatment well    Behavior During Therapy [facility] for tasks assessed/performed           Past Medical History:  Diagnosis Date  . Arthritis    OA  . Diabetes mellitus without complication ([place])    TYPE 2  . History of colon polyps 2015  . Lumbar stress fracture AGE 64   NO SX DONE, HEALED  . 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 He currently does not take nothing for it. He declines meds and is tired.     Past Surgical History:  Procedure Laterality Date  . FRACTURE SURGERY  1978   RIGHT SHOULDER PINNING  . HERNIA REPAIR     UMBILICAL 4650, GROIN FEB 1975  . I & D KNEE WITH POLY EXCHANGE Right 08/15/2020   Procedure: RIGHT UNI COMPARTMENTAL KNEE IRRIGATION AND DEBRIDEMENT KNEE WITH POLY EXCHANGE;  Surgeon: Paralee Cancel, MD;  Location: [facility];  Service: Orthopedics;  Laterality: Right;  . PARTIAL KNEE ARTHROPLASTY Right 07/29/2019   Procedure: UNICOMPARTMENTAL KNEE Medially;  Surgeon: Paralee Cancel, MD;  Location: [facility];  Service: Orthopedics;  Laterality: Right;  90 mins  . RIGHT SHOULDER PINS REMOVED      There were no vitals filed for this visit.   Subjective Assessment - 11/06/20 1025    Subjective Pt. reporting he has been inconsistent with HEP.    Patient Stated Goals "to be able to bend my knee more and have more agility in the  knee"    Currently in Pain? Yes    Pain Score 1     Pain Location Knee    Pain Orientation Right;Anterior    Pain Descriptors / Indicators Sharp    Pain Type Acute pain;Surgical pain    Pain Frequency Intermittent    Multiple Pain Sites No              [facility] PT Assessment - 11/06/20 0001      AROM   Right/Left Knee Right    Right Knee Extension 2    Right Knee Flexion 114   supine     PROM   Right/Left Knee Right    Right Knee Extension 1    Right Knee Flexion 122   supine soft end feel     Strength   Strength Assessment Site Hip;Knee;Ankle    Right/Left Hip Right;Left    Right Hip Flexion 4+/5    Right Hip Extension 5/5    Right Hip ABduction --   5-/5   Right Hip ADduction --   5-/5   Left Hip Flexion 5/5    Left Hip Extension 5/5    Left Hip ABduction 5/5    Left Hip ADduction 5/5    Right/Left Knee Right;Left  Right Knee Flexion 4+/5    Right Knee Extension 4+/5    Left Knee Flexion 5/5    Left Knee Extension 5/5    Right/Left Ankle Right;Left    Right Ankle Dorsiflexion 5/5    Left Ankle Dorsiflexion 5/5                         [name] Treatment/Exercise - 11/06/20 0001      Knee/Hip Exercises: Stretches   Hip Flexor Stretch Right;30 seconds;2 reps    Hip Flexor Stretch Limitations mod thomas position with strap     Knee: Self-Stretch to increase Flexion --   lunge knee flexion stretch with heavy B UE support   Knee: Self-Stretch Limitations 5" x 15    ITB Stretch Right;30 seconds;1 rep    ITB Stretch Limitations supine with strap      Knee/Hip Exercises: Aerobic   Nustep Lvl 5, 6 min (UE/LE)   progressively closer seat setting     Vasopneumatic   Number Minutes Vasopneumatic  10 minutes    Vasopnuematic Location  Knee   R   Vasopneumatic Pressure Medium    Vasopneumatic Temperature  34      Manual Therapy   Manual Therapy Joint mobilization    Joint Mobilization R patellar mobs (mod limitation)- all directions; R  tibiofemoral A/P & P/A for knee flexion & extension ROM                       PT Long Term Goals - 11/06/20 1059      PT LONG TERM GOAL #1   Title Patient will be independent with ongoing/advanced HEP for self-management at home    Status Partially Met      PT LONG TERM GOAL #2   Title R knee AROM >/= 2-120 dg to promote normal gait and stair mechanics    Status Partially Met      PT LONG TERM GOAL #3   Title Patient will demonstrate improved R LE strength to >/= 5-/5 for improved stability and ease of mobility    Status Partially Met      PT LONG TERM GOAL #4   Title Patient will ambulate without AD with normal gait pattern on all surfaces    Status Partially Met   11/06/20: improving gait mechanics; still with trunk lean     PT LONG TERM GOAL #5   Title Patient wil negotiate stairs with normal reciprocal step pattern to increase ease of access to all level of home    Status On-going   deferred due to time contraints.     PT LONG TERM GOAL #6   Title Patient to report ability to perform ADLs, household, and work-related tasks without limitation due to R knee pain or LOM    Status Partially Met   11/06/20: notes no limitation with ADLs; still somewhat limited with household remodelling                Plan - 11/06/20 1035    Clinical Impression Statement Pt. able to demonstrate R knee flexion PROM progress to 122 dg AROM flexion to 114 dg today.  Progressing well toward LTG for ROM.  reports decreasing R knee pain since his fall a few weeks ago and encouraged pt. to continues consistent HEP performance for full benefit from therapy.  Pt. has partially met LTG #2.  Pt. has partially met LTG #3 as he was  able to at least 5-/5 strength or better in ~  of his R LE musculature with MMT.  Pt. demonstrating improving gait mechanics however still with lateral trunk without AD.  Has partially achieved LTG #4.  LTG #5 for stair navigation deferred today due to time  constraints however pt. able to partially achieve LTG #6 as he notes no limitation with ADLs.  Does still have some limitation with work-related tasks and home remodeling.  Ended visit with ice/compression to knee to reduce post-exercise swelling.    Comorbidities s/p I & D of infected R UKR (MSSA joint infection) with poly exchange - 08/15/20; R knee medial UKR - 07/29/19, OA, DM, h/o lumbar compression fracture, R shoulder fracture s/p pinning, h/o seizures    Rehab Potential Good    PT Frequency 2x / week    PT Duration 4 weeks    PT Treatment/Interventions ADLs/Self Care Home Management;Cryotherapy;Electrical Stimulation;Gait training;Stair training;Functional mobility training;Therapeutic activities;Therapeutic exercise;Balance training;Neuromuscular re-education;Patient/family education;Manual techniques;Scar mobilization;Passive range of motion;Dry needling;Taping;Vasopneumatic Device;Joint Manipulations;Moist Heat    PT Next Visit Plan R LE flexibility & knee ROM, R LE stretching    Consulted and Agree with Plan of Care Patient           Patient will benefit from skilled therapeutic intervention in order to improve the following deficits and impairments:  Abnormal gait,Decreased activity tolerance,Decreased balance,Decreased endurance,Decreased mobility,Decreased range of motion,Decreased safety awareness,Decreased scar mobility,Decreased strength,Difficulty walking,Increased edema,Increased fascial restricitons,Increased muscle spasms,Impaired perceived functional ability,Impaired flexibility,Improper body mechanics,Postural dysfunction,Pain  Visit Diagnosis: Acute pain of right knee  Stiffness of right knee, not elsewhere classified  Localized edema  Muscle weakness (generalized)  Other abnormalities of gait and mobility     Problem List Patient Active Problem List   Diagnosis Date Noted  . Drug reaction   . Adverse reaction to antibiotic, initial encounter 09/09/2020  .  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 Prediabetes 09/09/2020  . Infected right UKR 08/15/2020  . Morbid obesity ([place]) 10/05/2019  . S/P right UKR 07/29/2019  . Healthcare maintenance 07/09/2018  . Benign prostatic hyperplasia with urinary frequency 07/09/2018  . Controlled type 2 diabetes mellitus without complication, without long-term current use of insulin ([place]) 07/09/2018    Bess Harvest, PTA 11/06/20 6:18 PM   [facility] [address]  [place], [place], [address] Phone: [phone]   Fax:  [phone]  Name: Aum Caggiano Sr. MRN: [id number] Date of Birth: [birth year]   PHYSICAL THERAPY DISCHARGE SUMMARY  Visits from Start of Care: 7  Current functional level related to goals / functional outcomes:   Refer to above clinical impression for status as of last visit on 11/06/20. Patient had to cancel visits for next 2 weeks due to testing positive for COVID and was due to return to PT on 11/28/19 but appointment cancelled due to inclement weather. Pt did not schedule any further appointments and has not returned to PT in >30 days, therefore will proceed with discharge from PT for this episode.   Remaining deficits:   As above.   Education / Equipment:   HEP  Plan: Patient agrees to discharge.  Patient goals were partially met. Patient is being discharged due to not returning since the last visit.  ?????     Percival Spanish, PT, MPT 12/21/20, 1:42 PM  [facility] [address]  [place], [place], [address] Phone: [phone]   Fax:  [phone]

## 2020-11-08 ENCOUNTER — Encounter: Payer: 59 | Admitting: Physical Therapy

## 2020-11-09 ENCOUNTER — Ambulatory Visit: Payer: 59 | Admitting: Physical Therapy

## 2020-11-21 ENCOUNTER — Other Ambulatory Visit: Payer: Self-pay

## 2020-11-21 ENCOUNTER — Ambulatory Visit (INDEPENDENT_AMBULATORY_CARE_PROVIDER_SITE_OTHER): Payer: 59 | Admitting: Infectious Disease

## 2020-11-21 ENCOUNTER — Encounter: Payer: Self-pay | Admitting: Infectious Disease

## 2020-11-21 VITALS — BP 132/80 | HR 58 | Temp 97.5°F | Wt 243.0 lb

## 2020-11-21 DIAGNOSIS — A4901 Methicillin susceptible Staphylococcus aureus infection, unspecified site: Secondary | ICD-10-CM

## 2020-11-21 DIAGNOSIS — E119 Type 2 diabetes mellitus without complications: Secondary | ICD-10-CM

## 2020-11-21 DIAGNOSIS — T8453XA Infection and inflammatory reaction due to internal right knee prosthesis, initial encounter: Secondary | ICD-10-CM | POA: Diagnosis not present

## 2020-11-21 DIAGNOSIS — T8453XD Infection and inflammatory reaction due to internal right knee prosthesis, subsequent encounter: Secondary | ICD-10-CM

## 2020-11-21 DIAGNOSIS — T3695XA Adverse effect of unspecified systemic antibiotic, initial encounter: Secondary | ICD-10-CM

## 2020-11-21 DIAGNOSIS — T366X5A Adverse effect of rifampicins, initial encounter: Secondary | ICD-10-CM

## 2020-11-21 DIAGNOSIS — U071 COVID-19: Secondary | ICD-10-CM | POA: Diagnosis not present

## 2020-11-21 DIAGNOSIS — K719 Toxic liver disease, unspecified: Secondary | ICD-10-CM

## 2020-11-21 DIAGNOSIS — Z96651 Presence of right artificial knee joint: Secondary | ICD-10-CM

## 2020-11-21 HISTORY — DX: COVID-19: U07.1

## 2020-11-21 NOTE — Progress Notes (Signed)
Subjective:  Chief complaint pain at his operative site but also more distal in the shin that he rates as 10 out of 10 in severity    Patient ID: Jesus Holsinger., male    DOB: 1956-01-16, 65 y.o.   MRN: [id number]  HPI   Jesus Hemmer. is a 65 y.o. male  medical history of type 2 diabetes, OSA, right medial unicompartmental knee replacement(UKR)which was complicated by anterior knee erythema and treated with a course of cephalexin September 2020 and recent hospitalization from 10/4-10/7 forMSSAright knee jointinfections/p right UKR irrigation and debridement (08/15/20) and poly-exhange by Dr Charlann Boxer . He was initially on vancomycin but changed to Cefazolin and Rifampin per PICC line who began having nausea, paresthesias, pruritis and fever (103.87F) on 09/08/20 after taking 2 doses of his Cefazolin. Shortly after beginning his infusion he began having symptoms stated above without fever at the time. This lasted about 2 hours. He then took an Allegra prior to his afternoon infusion and had recurrence of the symptoms plus fever up to 103.5. Contacted his ID doctor who recommended hydroxyzine every 6 hours as needed for itching. Patient skipped his evening dose for fear of having recurrence of this reaction. When he awoke this morning he was feeling a bit better and took his a.m. dose of cefazolin with recurrence of symptoms.   I was called by the patient and his wife and I was concerned for PICC Line infection +/- bacteremia and advised him to go to ER.  IN interim he had developed a macular rash with some petechIal aspects on legs, arms, chest and back.  Blood cultures were negative and cefazolin was stopped along with rifampin.  He also had evidence of a drug-induced liver injury.  We switched him over to daptomycin and his rash has been resolving and now is resolved.  His hepatitis also has been resolving when last checked in hospital.  Knee pain ihad been dramatically improved.  Turn  to quite a bit of vigorous physical activity including replacing tile work in flooring and in the bathroom and restoring the bath and that it been torn out.  Apparently recently he had a mechanical fall and stressed the knee and he has been hurting ever since then particularly in the last 24 hours.  He unfortunate developed COVID-19 infection shortly before he was going to be seeing Dr. Constance Goltz.  Clearly needs to see Dr. Constance Goltz again sometime soon.     Past Medical History:  Diagnosis Date  . Arthritis    OA  . Diabetes mellitus without complication ([facility])    TYPE 2  . History of colon polyps 2015  . Lumbar stress fracture AGE 25   NO SX DONE, HEALED  . 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 He currently does not take nothing for it. He declines meds and is tired.     Past Surgical History:  Procedure Laterality Date  . FRACTURE SURGERY  1978   RIGHT SHOULDER PINNING  . HERNIA REPAIR     UMBILICAL 2017, GROIN FEB 1975  . I & D KNEE WITH POLY EXCHANGE Right 08/15/2020   Procedure: RIGHT UNI COMPARTMENTAL KNEE IRRIGATION AND DEBRIDEMENT KNEE WITH POLY EXCHANGE;  Surgeon: [name], MD;  Location: [facility];  Service: Orthopedics;  Laterality: Right;  . PARTIAL KNEE ARTHROPLASTY Right 07/29/2019   Procedure: UNICOMPARTMENTAL KNEE Medially;  Surgeon: [name], MD;  Location: [facility];  Service: Orthopedics;  Laterality: Right;  90 mins  .  RIGHT SHOULDER PINS REMOVED      Family History  Problem Relation Age of Onset  . Hyperlipidemia Mother   . Mental illness Mother       Social History   Socioeconomic History  . Marital status: Married    Spouse name: Not on file  . Number of children: Not on file  . Years of education: Not on file  . Highest education level: Not on file  Occupational History  . Not on file  Tobacco Use  . Smoking status: Never Smoker  . Smokeless tobacco: Never Used  Vaping Use  . Vaping Use: Never used  Substance and Sexual Activity   . Alcohol use: No  . Drug use: No  . Sexual activity: Not on file  Other Topics Concern  . Not on file  Social History Narrative  . Not on file   Social Determinants of Health   Financial Resource Strain: Not on file  Food Insecurity: Not on file  Transportation Needs: Not on file  Physical Activity: Not on file  Stress: Not on file  Social Connections: Not on file    Allergies  Allergen Reactions  . Cefazolin Shortness Of Breath and Rash    Diffuse rash, chest pain,   . Benadryl [Diphenhydramine] Other (See Comments)    He cannot recall what happened. Maybe "took too much."  . Rifampin     LFT elevations     Current Outpatient Medications:  .  aspirin 81 MG EC tablet, Take 81 mg by mouth daily. Swallow whole., Disp: , Rfl:  .  doxycycline (VIBRA-TABS) 100 MG tablet, Take 1 tablet (100 mg total) by mouth 2 (two) times daily., Disp: 60 tablet, Rfl: 11 .  Glucosamine-Chondroitin (OSTEO BI-FLEX REGULAR STRENGTH PO), Take 2 tablets by mouth daily. , Disp: , Rfl:  .  ibuprofen (ADVIL) 200 MG tablet, Take 200 mg by mouth every 6 (six) hours as needed., Disp: , Rfl:  .  Melatonin 5 MG CAPS, Take 10 mg by mouth at bedtime. , Disp: , Rfl:  .  metFORMIN (GLUCOPHAGE-XR) 500 MG 24 hr tablet, Take 1 tablet (500 mg total) by mouth daily with supper., Disp: 90 tablet, Rfl: 1 .  Omega-3 Fatty Acids (FISH OIL ULTRA) 1400 MG CAPS, Take 1,400 mg by mouth daily., Disp: , Rfl:  .  OVER THE COUNTER MEDICATION, Take 2 capsules by mouth daily. Cinsulin (Cinnamon Chromium Picolinate Vitamin D3), Disp: , Rfl:  .  TURMERIC PO, Take 2,000 mg by mouth daily. 1000 MG/CAPSULE, Disp: , Rfl:  .  docusate sodium (COLACE) 100 MG capsule, Take 1 capsule (100 mg total) by mouth 2 (two) times daily. (Patient not taking: No sig reported), Disp: 28 capsule, Rfl: 0 .  ferrous sulfate (FERROUSUL) 325 (65 FE) MG tablet, Take 1 tablet (325 mg total) by mouth 3 (three) times daily with meals for 14 days. (Patient not  taking: Reported on 09/09/2020), Disp: 42 tablet, Rfl: 0 .  hydrOXYzine (ATARAX/VISTARIL) 25 MG tablet, Take 1 tablet (25 mg total) by mouth every 6 (six) hours as needed for itching. (Patient not taking: No sig reported), Disp: 30 tablet, Rfl: 0 .  methocarbamol (ROBAXIN) 500 MG tablet, Take 1 tablet (500 mg total) by mouth every 6 (six) hours as needed for muscle spasms. (Patient not taking: No sig reported), Disp: 40 tablet, Rfl: 0 .  polyethylene glycol (MIRALAX / GLYCOLAX) 17 g packet, Take 17 g by mouth 2 (two) times daily. (Patient not taking: No sig  reported), Disp: 28 packet, Rfl: 0  Review of Systems  Constitutional: Negative for chills and fever.  HENT: Negative for congestion and sore throat.   Eyes: Negative for photophobia.  Respiratory: Negative for cough, shortness of breath and wheezing.   Cardiovascular: Negative for chest pain, palpitations and leg swelling.  Gastrointestinal: Negative for abdominal pain, blood in stool, constipation, diarrhea, nausea and vomiting.  Genitourinary: Negative for dysuria, flank pain and hematuria.  Musculoskeletal: Positive for joint swelling and myalgias. Negative for back pain.  Skin: Positive for wound. Negative for rash.  Neurological: Negative for dizziness, weakness and headaches.  Hematological: Does not bruise/bleed easily.  Psychiatric/Behavioral: Negative for agitation, confusion, hallucinations and suicidal ideas.       Objective:   Physical Exam Constitutional:      General: He is not in acute distress.    Appearance: He is well-developed. He is not diaphoretic.  HENT:     Head: Normocephalic and atraumatic.     Mouth/Throat:     Pharynx: No oropharyngeal exudate.  Eyes:     General: No scleral icterus.    Conjunctiva/sclera: Conjunctivae normal.  Cardiovascular:     Rate and Rhythm: Normal rate and regular rhythm.  Pulmonary:     Effort: Pulmonary effort is normal. No respiratory distress.     Breath sounds: No  wheezing.  Abdominal:     General: There is no distension.  Musculoskeletal:        General: No tenderness.     Cervical back: Normal range of motion and neck supple.  Skin:    General: Skin is warm and dry.     Coloration: Skin is not pale.     Findings: No erythema or rash.  Neurological:     General: No focal deficit present.     Mental Status: He is alert and oriented to person, place, and time.     Motor: No abnormal muscle tone.     Coordination: Coordination normal.  Psychiatric:        Mood and Affect: Mood normal.        Behavior: Behavior normal.        Thought Content: Thought content normal.        Judgment: Judgment normal.   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  It may very well be due to mechanical problems though I also worry about his prosthesis even in the context of mechanical stresses.  We will check inflammatory markers today and basic labs.  He will continue on doxycycline  Drug rash: This is resolved.  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  COVID-19 infection: He is recovering from this was not vaccinated

## 2020-11-22 LAB — COMPLETE METABOLIC PANEL WITH GFR
AG Ratio: 1.4 (calc) (ref 1.0–2.5)
ALT: 14 U/L (ref 9–46)
AST: 15 U/L (ref 10–35)
Albumin: 3.9 g/dL (ref 3.6–5.1)
Alkaline phosphatase (APISO): 78 U/L (ref 35–144)
BUN/Creatinine Ratio: 34 (calc) — ABNORMAL HIGH (ref 6–22)
BUN: 31 mg/dL — ABNORMAL HIGH (ref 7–25)
CO2: 28 mmol/L (ref 20–32)
Calcium: 9.4 mg/dL (ref 8.6–10.3)
Chloride: 105 mmol/L (ref 98–110)
Creat: 0.9 mg/dL (ref 0.70–1.25)
GFR, Est African American: 104 mL/min/{1.73_m2} (ref >=60)
GFR, Est Non African American: 90 mL/min/{1.73_m2} (ref >=60)
Globulin: 2.8 g/dL (calc) (ref 1.9–3.7)
Glucose, Bld: 113 mg/dL — ABNORMAL HIGH (ref 65–99)
Potassium: 5.1 mmol/L (ref 3.5–5.3)
Sodium: 139 mmol/L (ref 135–146)
Total Bilirubin: 0.4 mg/dL (ref 0.2–1.2)
Total Protein: 6.7 g/dL (ref 6.1–8.1)

## 2020-11-22 LAB — CBC WITH DIFFERENTIAL/PLATELET
Absolute Monocytes: 484 cells/uL (ref 200–950)
Basophils Absolute: 22 cells/uL (ref 0–200)
Basophils Relative: 0.4 %
Eosinophils Absolute: 182 cells/uL (ref 15–500)
Eosinophils Relative: 3.3 %
HCT: 48.7 % (ref 38.5–50.0)
Hemoglobin: 16.1 g/dL (ref 13.2–17.1)
Lymphs Abs: 1650 cells/uL (ref 850–3900)
MCH: 28.9 pg (ref 27.0–33.0)
MCHC: 33.1 g/dL (ref 32.0–36.0)
MCV: 87.3 fL (ref 80.0–100.0)
MPV: 9.4 fL (ref 7.5–12.5)
Monocytes Relative: 8.8 %
Neutro Abs: 3163 cells/uL (ref 1500–7800)
Neutrophils Relative %: 57.5 %
Platelets: 295 10*3/uL (ref 140–400)
RBC: 5.58 10*6/uL (ref 4.20–5.80)
RDW: 13.1 % (ref 11.0–15.0)
Total Lymphocyte: 30 %
WBC: 5.5 10*3/uL (ref 3.8–10.8)

## 2020-11-22 LAB — C-REACTIVE PROTEIN: CRP: 2.3 mg/L (ref <8.0)

## 2020-11-22 LAB — SEDIMENTATION RATE: Sed Rate: 2 mm/h (ref 0–20)

## 2020-11-27 ENCOUNTER — Ambulatory Visit: Payer: 59

## 2021-02-04 ENCOUNTER — Other Ambulatory Visit: Payer: Self-pay | Admitting: Family Medicine

## 2021-02-04 DIAGNOSIS — E119 Type 2 diabetes mellitus without complications: Secondary | ICD-10-CM

## 2021-02-28 IMAGING — CR DG KNEE COMPLETE 4+V*R*
4 series · 4 of 4 positions shown · non-contrast
Comparison: None.

CLINICAL DATA: Fever to 103.5 last night. Reported right knee
surgery with incision and drainage on 11/16/2019. Partial right knee
arthroplasty on 07/29/2019.

EXAM:
RIGHT KNEE - COMPLETE 4+ VIEW

[x knee ap right (1 of 2)]
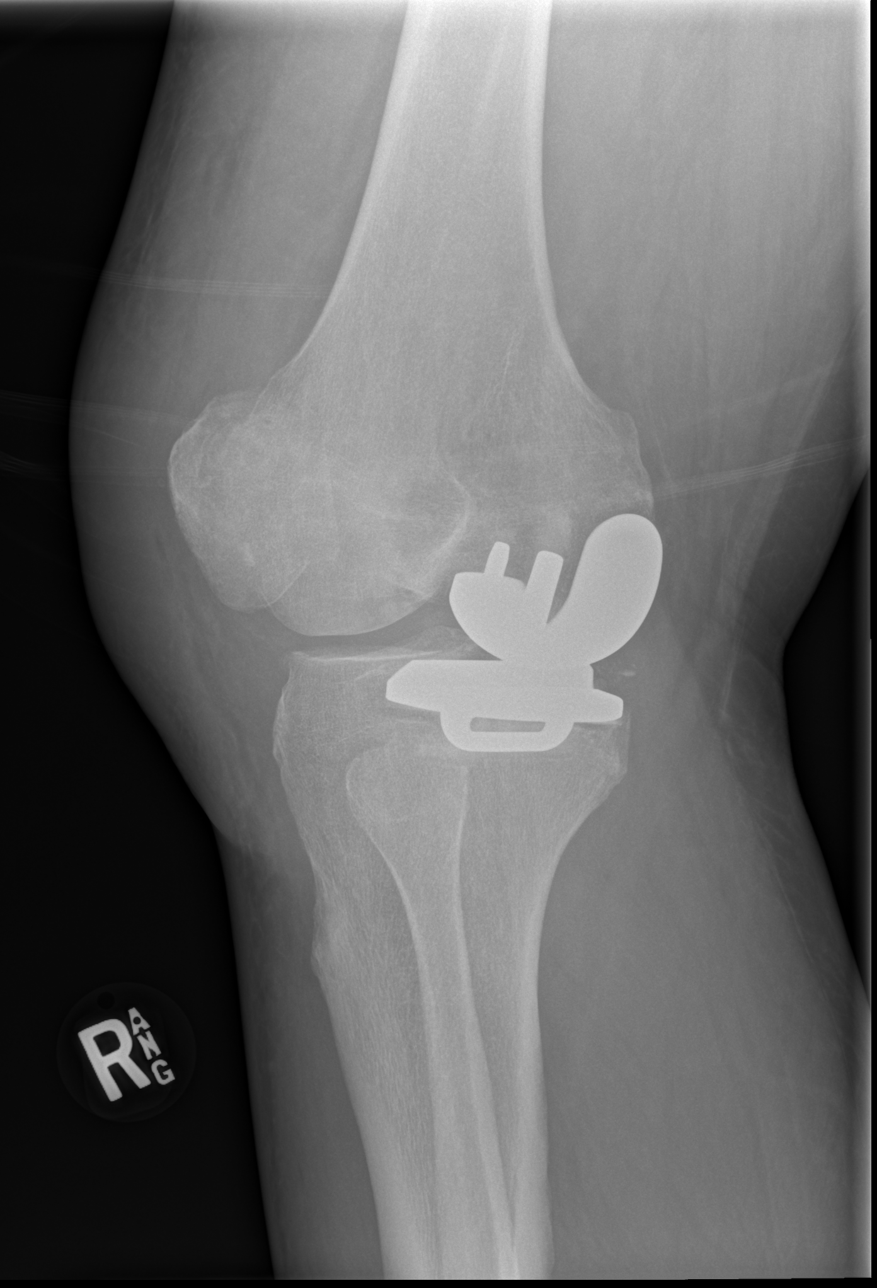

[x knee ap right (2 of 2)]
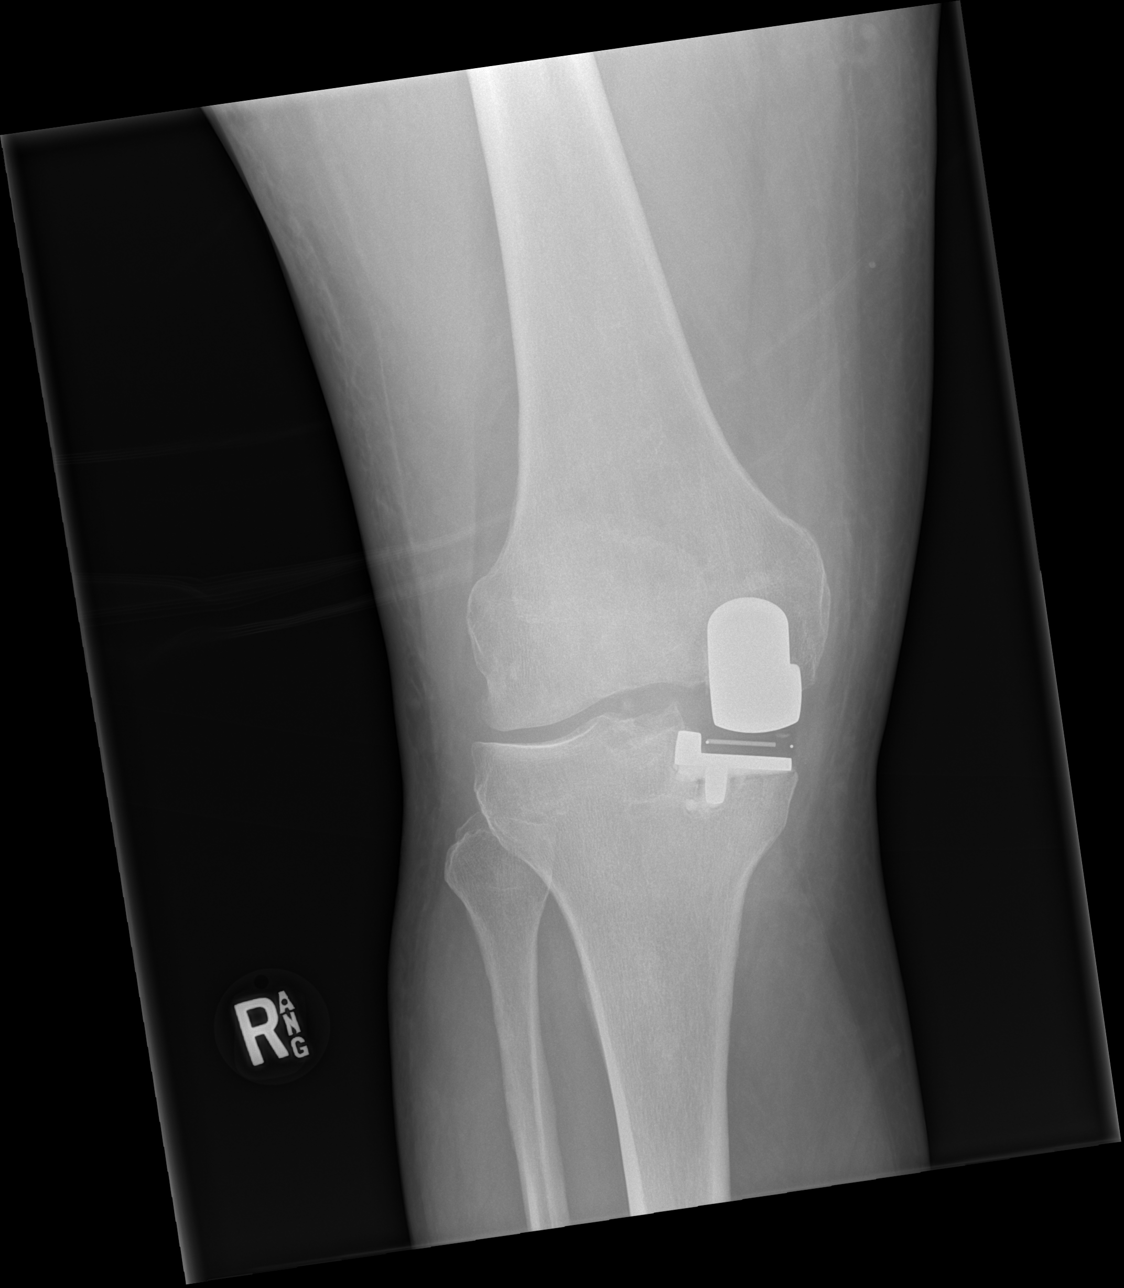

[x knee obl right]
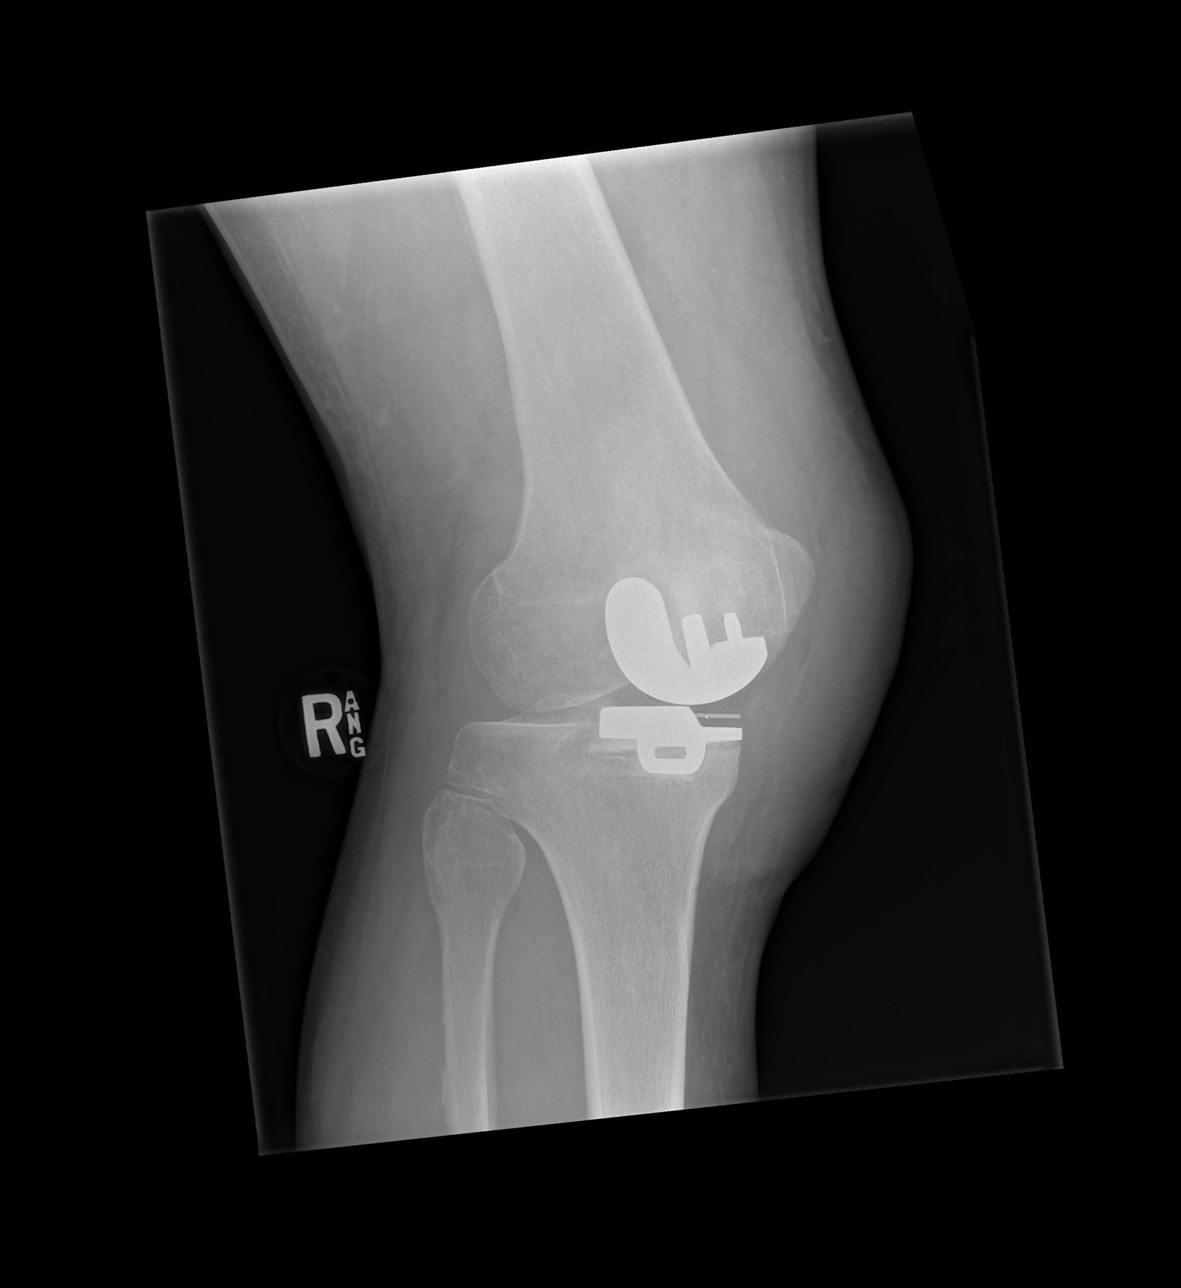

[x knee lat right]
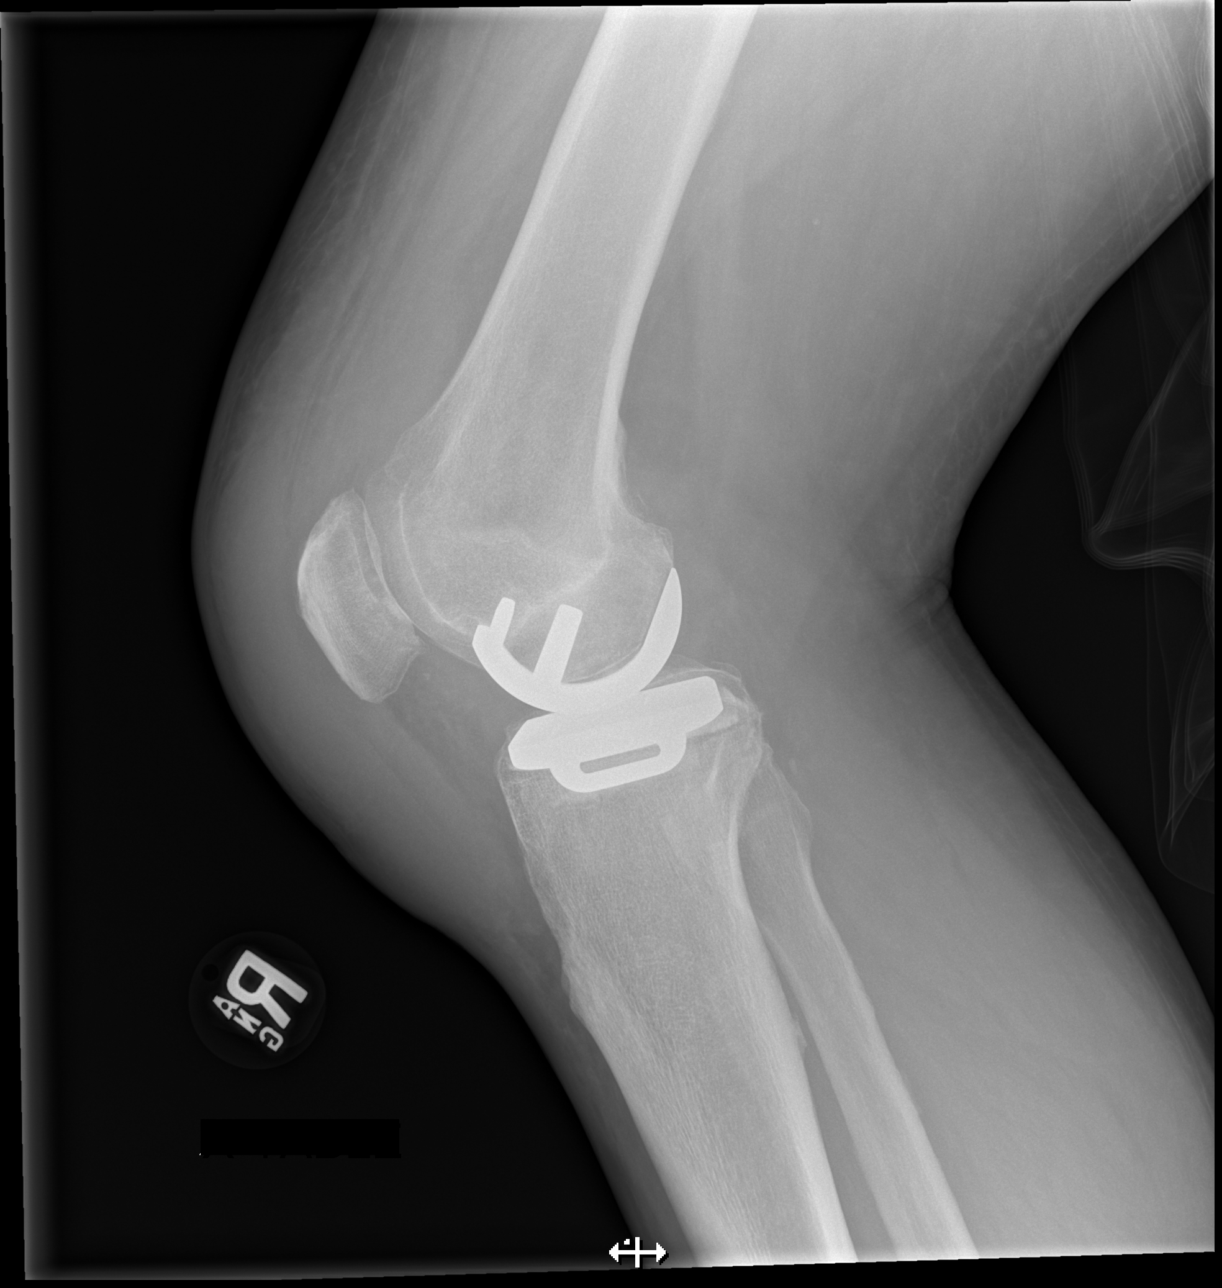

[4 of 4 positions shown; findings below may reference images not displayed]

FINDINGS: No fracture.  No bone lesion.

There is a line of lucency adjacent to the tibial component of the
medial compartment hemiarthroplasty, which could reflect loosening
or infection. The prosthetic components are well aligned.

Mild lateral joint space compartment narrowing. Minor marginal
spurring from the patella.

Small to moderate suprapatellar joint effusion. There is significant
anterior knee soft tissue swelling, anterior to the patella and
quadriceps and patellar tendons.
IMPRESSION: 1. Linear lucency adjacent to the tibial component of the medial
compartment hemiarthroplasty. This could reflect loosening or
infection.
2. Small to moderate joint effusion with anterior soft tissue
swelling.
3. No fractures.  No other acute finding.

## 2021-03-21 ENCOUNTER — Ambulatory Visit (INDEPENDENT_AMBULATORY_CARE_PROVIDER_SITE_OTHER): Payer: 59 | Admitting: Infectious Disease

## 2021-03-21 ENCOUNTER — Other Ambulatory Visit: Payer: Self-pay

## 2021-03-21 ENCOUNTER — Encounter: Payer: Self-pay | Admitting: Infectious Disease

## 2021-03-21 VITALS — BP 128/75 | HR 65 | Temp 97.7°F | Wt 247.0 lb

## 2021-03-21 DIAGNOSIS — T50905D Adverse effect of unspecified drugs, medicaments and biological substances, subsequent encounter: Secondary | ICD-10-CM | POA: Diagnosis not present

## 2021-03-21 DIAGNOSIS — E119 Type 2 diabetes mellitus without complications: Secondary | ICD-10-CM

## 2021-03-21 DIAGNOSIS — T8453XD Infection and inflammatory reaction due to internal right knee prosthesis, subsequent encounter: Secondary | ICD-10-CM | POA: Diagnosis not present

## 2021-03-21 DIAGNOSIS — A4901 Methicillin susceptible Staphylococcus aureus infection, unspecified site: Secondary | ICD-10-CM

## 2021-03-21 MED ORDER — DOXYCYCLINE HYCLATE 100 MG PO TABS: 100.0000 mg | ORAL_TABLET | Freq: Two times a day (BID) | ORAL | 5 refills | Status: DC

## 2021-03-21 NOTE — Progress Notes (Signed)
Subjective:  Chief complaint still has some pain distal site of his incision but much improved compared to prior    Patient ID: Jesus La Sr., male    DOB: 1955-11-26, 65 y.o.   MRN: [id number]  HPI   Jesus Nunez. is a 65 y.o. male  medical history of type 2 diabetes, OSA, right medial unicompartmental knee replacement(UKR)which was complicated by anterior knee erythema and treated with a course of cephalexin September 2020 and recent hospitalization from 10/4-10/7 forMSSAright knee jointinfections/p right UKR irrigation and debridement (08/15/20) and poly-exhange by Dr Charlann Boxer . He was initially on vancomycin but changed to Cefazolin and Rifampin per PICC line who began having nausea, paresthesias, pruritis and fever (103.31F) on 09/08/20 after taking 2 doses of his Cefazolin. Shortly after beginning his infusion he began having symptoms stated above without fever at the time. This lasted about 2 hours. He then took an Allegra prior to his afternoon infusion and had recurrence of the symptoms plus fever up to 103.5. Contacted his ID doctor who recommended hydroxyzine every 6 hours as needed for itching. Patient skipped his evening dose for fear of having recurrence of this reaction. When he awoke this morning he was feeling a bit better and took his a.m. dose of cefazolin with recurrence of symptoms.   I was called by the patient and his wife and I was concerned for PICC Line infection +/- bacteremia and advised him to go to ER.  IN interim he had developed a macular rash with some petechIal aspects on legs, arms, chest and back.  Blood cultures were negative and cefazolin was stopped along with rifampin.  He also had evidence of a drug-induced liver injury.  We switched him over to daptomycin and his rash has been resolving and now is resolved.  His hepatitis also has been resolving when last checked in hospital.  Knee pain ihad been dramatically improved.  Turn to quite a bit of  vigorous physical activity including replacing tile work in flooring and in the bathroom and restoring the bath and that it been torn out.  Apparently recently he had a mechanical fall and stressed the knee and he has been hurting ever since then particularly in the last 24 hours.  He unfortunate developed COVID-19 infection shortly before he was going to be seeing Dr. Charlann Boxer   Seen at [facility] by Dr. Mickle Asper physician assistant.  Since then he has continued with vigorous activity frequently walking up to 50 miles a week.  He does have knee pain but again as mentioned he is putting quite a bit of stress on the knee with his vigorous physical activity.  Overall he is dramatically improved compared to when his infection was manifest.  He is not enthusiastic about ever coming off antibiotics but I told him at some point organ to have to trial him off by would not propose that until he got a year out from his surgery.   Past Medical History:  Diagnosis Date  . Arthritis    OA  . COVID-19 virus infection 11/21/2020  . Diabetes mellitus without complication (HCC)    TYPE 2  . History of colon polyps 2015  . Lumbar stress fracture AGE 65   NO SX DONE, HEALED  . 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 He currently does not take nothing for it. He declines meds and is tired.     Past Surgical History:  Procedure Laterality Date  . FRACTURE  SURGERY  1978   RIGHT SHOULDER PINNING  . HERNIA REPAIR     UMBILICAL 2017, GROIN FEB 1975  . I & D KNEE WITH POLY EXCHANGE Right 08/15/2020   Procedure: RIGHT UNI COMPARTMENTAL KNEE IRRIGATION AND DEBRIDEMENT KNEE WITH POLY EXCHANGE;  Surgeon: [name], MD;  Location: [facility];  Service: Orthopedics;  Laterality: Right;  . PARTIAL KNEE ARTHROPLASTY Right 07/29/2019   Procedure: UNICOMPARTMENTAL KNEE Medially;  Surgeon: [name], MD;  Location: [facility];  Service: Orthopedics;  Laterality: Right;  90 mins  . RIGHT SHOULDER PINS  REMOVED      Family History  Problem Relation Age of Onset  . Hyperlipidemia Mother   . Mental illness Mother       Social History   Socioeconomic History  . Marital status: Married    Spouse name: Not on file  . Number of children: Not on file  . Years of education: Not on file  . Highest education level: Not on file  Occupational History  . Not on file  Tobacco Use  . Smoking status: Never Smoker  . Smokeless tobacco: Never Used  Vaping Use  . Vaping Use: Never used  Substance and Sexual Activity  . Alcohol use: No  . Drug use: No  . Sexual activity: Not on file  Other Topics Concern  . Not on file  Social History Narrative  . Not on file   Social Determinants of Health   Financial Resource Strain: Not on file  Food Insecurity: Not on file  Transportation Needs: Not on file  Physical Activity: Not on file  Stress: Not on file  Social Connections: Not on file    Allergies  Allergen Reactions  . Cefazolin Shortness Of Breath and Rash    Diffuse rash, chest pain,   . Benadryl [Diphenhydramine] Other (See Comments)    He cannot recall what happened. Maybe "took too much."  . Rifampin     LFT elevations     Current Outpatient Medications:  .  doxycycline (VIBRA-TABS) 100 MG tablet, Take 1 tablet (100 mg total) by mouth 2 (two) times daily., Disp: 60 tablet, Rfl: 11 .  Glucosamine-Chondroitin (OSTEO BI-FLEX REGULAR STRENGTH PO), Take 2 tablets by mouth daily. , Disp: , Rfl:  .  ibuprofen (ADVIL) 200 MG tablet, Take 200 mg by mouth every 6 (six) hours as needed., Disp: , Rfl:  .  metFORMIN (GLUCOPHAGE-XR) 500 MG 24 hr tablet, TAKE 1 TABLET(500 MG) BY MOUTH DAILY WITH SUPPER, Disp: 90 tablet, Rfl: 1 .  Omega-3 Fatty Acids (FISH OIL ULTRA) 1400 MG CAPS, Take 1,400 mg by mouth daily., Disp: , Rfl:  .  OVER THE COUNTER MEDICATION, Take 2 capsules by mouth daily. Cinsulin (Cinnamon Chromium Picolinate Vitamin D3), Disp: , Rfl:  .  aspirin 81 MG EC tablet, Take 81  mg by mouth daily. Swallow whole., Disp: , Rfl:  .  docusate sodium (COLACE) 100 MG capsule, Take 1 capsule (100 mg total) by mouth 2 (two) times daily. (Patient not taking: No sig reported), Disp: 28 capsule, Rfl: 0 .  ferrous sulfate (FERROUSUL) 325 (65 FE) MG tablet, Take 1 tablet (325 mg total) by mouth 3 (three) times daily with meals for 14 days. (Patient not taking: Reported on 09/09/2020), Disp: 42 tablet, Rfl: 0 .  hydrOXYzine (ATARAX/VISTARIL) 25 MG tablet, Take 1 tablet (25 mg total) by mouth every 6 (six) hours as needed for itching. (Patient not taking: No sig reported), Disp: 30 tablet, Rfl: 0 .  Melatonin 5 MG CAPS, Take 10 mg by mouth at bedtime. , Disp: , Rfl:  .  methocarbamol (ROBAXIN) 500 MG tablet, Take 1 tablet (500 mg total) by mouth every 6 (six) hours as needed for muscle spasms. (Patient not taking: No sig reported), Disp: 40 tablet, Rfl: 0 .  polyethylene glycol (MIRALAX / GLYCOLAX) 17 g packet, Take 17 g by mouth 2 (two) times daily. (Patient not taking: No sig reported), Disp: 28 packet, Rfl: 0 .  TURMERIC PO, Take 2,000 mg by mouth daily. 1000 MG/CAPSULE, Disp: , Rfl:   Review of Systems  Constitutional: Negative for chills and fever.  HENT: Negative for congestion and sore throat.   Eyes: Negative for photophobia.  Respiratory: Negative for cough, shortness of breath and wheezing.   Cardiovascular: Negative for chest pain, palpitations and leg swelling.  Gastrointestinal: Negative for abdominal pain, blood in stool, constipation, diarrhea, nausea and vomiting.  Genitourinary: Negative for dysuria, flank pain and hematuria.  Musculoskeletal: Positive for arthralgias. Negative for back pain.  Skin: Negative for rash.  Neurological: Negative for dizziness, weakness and headaches.  Hematological: Does not bruise/bleed easily.  Psychiatric/Behavioral: Negative for agitation, confusion, hallucinations and suicidal ideas.       Objective:   Physical  Exam Constitutional:      General: He is not in acute distress.    Appearance: He is well-developed. He is not diaphoretic.  HENT:     Head: Normocephalic and atraumatic.     Mouth/Throat:     Pharynx: No oropharyngeal exudate.  Eyes:     General: No scleral icterus.    Extraocular Movements: Extraocular movements intact.     Conjunctiva/sclera: Conjunctivae normal.  Cardiovascular:     Rate and Rhythm: Normal rate and regular rhythm.  Pulmonary:     Effort: Pulmonary effort is normal. No respiratory distress.     Breath sounds: No wheezing.  Abdominal:     General: There is no distension.  Musculoskeletal:        General: No tenderness.     Cervical back: Normal range of motion and neck supple.  Skin:    General: Skin is warm and dry.     Coloration: Skin is not pale.     Findings: No erythema or rash.  Neurological:     General: No focal deficit present.     Mental Status: He is alert and oriented to person, place, and time.     Motor: No abnormal muscle tone.     Coordination: Coordination normal.  Psychiatric:        Mood and Affect: Mood normal.        Behavior: Behavior normal.        Thought Content: Thought content normal.        Judgment: Judgment normal.   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  Will check inflammatory markers and basic labs today   He will continue on doxycycline push into October.  Drug rash: This is resolved.  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

## 2021-03-22 LAB — CBC WITH DIFFERENTIAL/PLATELET
Absolute Monocytes: 515 cells/uL (ref 200–950)
Basophils Absolute: 31 cells/uL (ref 0–200)
Basophils Relative: 0.5 %
Eosinophils Absolute: 279 cells/uL (ref 15–500)
Eosinophils Relative: 4.5 %
HCT: 49.6 % (ref 38.5–50.0)
Hemoglobin: 16.7 g/dL (ref 13.2–17.1)
Lymphs Abs: 1761 cells/uL (ref 850–3900)
MCH: 30.2 pg (ref 27.0–33.0)
MCHC: 33.7 g/dL (ref 32.0–36.0)
MCV: 89.7 fL (ref 80.0–100.0)
MPV: 9.1 fL (ref 7.5–12.5)
Monocytes Relative: 8.3 %
Neutro Abs: 3615 cells/uL (ref 1500–7800)
Neutrophils Relative %: 58.3 %
Platelets: 258 10*3/uL (ref 140–400)
RBC: 5.53 10*6/uL (ref 4.20–5.80)
RDW: 14.3 % (ref 11.0–15.0)
Total Lymphocyte: 28.4 %
WBC: 6.2 10*3/uL (ref 3.8–10.8)

## 2021-03-22 LAB — SEDIMENTATION RATE: Sed Rate: 2 mm/h (ref 0–20)

## 2021-03-22 LAB — COMPLETE METABOLIC PANEL WITH GFR
AG Ratio: 1.8 (calc) (ref 1.0–2.5)
ALT: 18 U/L (ref 9–46)
AST: 19 U/L (ref 10–35)
Albumin: 4.3 g/dL (ref 3.6–5.1)
Alkaline phosphatase (APISO): 78 U/L (ref 35–144)
BUN/Creatinine Ratio: 28 (calc) — ABNORMAL HIGH (ref 6–22)
BUN: 27 mg/dL — ABNORMAL HIGH (ref 7–25)
CO2: 27 mmol/L (ref 20–32)
Calcium: 10.1 mg/dL (ref 8.6–10.3)
Chloride: 106 mmol/L (ref 98–110)
Creat: 0.95 mg/dL (ref 0.70–1.25)
GFR, Est African American: 98 mL/min/{1.73_m2} (ref >=60)
GFR, Est Non African American: 84 mL/min/{1.73_m2} (ref >=60)
Globulin: 2.4 g/dL (calc) (ref 1.9–3.7)
Glucose, Bld: 104 mg/dL — ABNORMAL HIGH (ref 65–99)
Potassium: 4.9 mmol/L (ref 3.5–5.3)
Sodium: 140 mmol/L (ref 135–146)
Total Bilirubin: 0.4 mg/dL (ref 0.2–1.2)
Total Protein: 6.7 g/dL (ref 6.1–8.1)

## 2021-03-22 LAB — C-REACTIVE PROTEIN: CRP: 1.9 mg/L (ref <8.0)

## 2021-04-18 ENCOUNTER — Encounter: Payer: Self-pay | Admitting: Infectious Disease

## 2021-04-18 ENCOUNTER — Other Ambulatory Visit: Payer: Self-pay

## 2021-04-18 ENCOUNTER — Telehealth: Payer: Self-pay

## 2021-04-18 ENCOUNTER — Ambulatory Visit (INDEPENDENT_AMBULATORY_CARE_PROVIDER_SITE_OTHER): Payer: 59 | Admitting: Infectious Disease

## 2021-04-18 VITALS — BP 135/79 | HR 71 | Temp 97.8°F | Resp 18 | Wt 244.4 lb

## 2021-04-18 DIAGNOSIS — E119 Type 2 diabetes mellitus without complications: Secondary | ICD-10-CM

## 2021-04-18 DIAGNOSIS — T50905D Adverse effect of unspecified drugs, medicaments and biological substances, subsequent encounter: Secondary | ICD-10-CM

## 2021-04-18 DIAGNOSIS — K719 Toxic liver disease, unspecified: Secondary | ICD-10-CM

## 2021-04-18 DIAGNOSIS — T8453XD Infection and inflammatory reaction due to internal right knee prosthesis, subsequent encounter: Secondary | ICD-10-CM

## 2021-04-18 DIAGNOSIS — A4901 Methicillin susceptible Staphylococcus aureus infection, unspecified site: Secondary | ICD-10-CM | POA: Diagnosis not present

## 2021-04-18 DIAGNOSIS — R42 Dizziness and giddiness: Secondary | ICD-10-CM | POA: Diagnosis not present

## 2021-04-18 DIAGNOSIS — T3695XA Adverse effect of unspecified systemic antibiotic, initial encounter: Secondary | ICD-10-CM

## 2021-04-18 HISTORY — DX: Dizziness and giddiness: R42

## 2021-04-18 NOTE — Telephone Encounter (Signed)
Patient has concerns regarding his antibiotics. Patient states he is currently taking Doxycycline and has been on multiple antibiotics in the past, but now has concerns that he did not mention during his most recent visit.  Patient states he has blurred vision since 12/21 and has follow up with ophthalmology, but truly suspects this may be related to antibiotics that he was taking previously. Patient accepts available appointment today with Dr. Daiva Eves.  Valarie Cones

## 2021-04-18 NOTE — Progress Notes (Signed)
Subjective:  Chief complaint: vertiginous symptoms   Patient ID: Jesus Nunez., male    DOB: September 24, 1956, 65 y.o.   MRN: [id number]  HPI   Jesus Nunez. is a 65 y.o. male  medical history of type 2 diabetes, OSA, right medial unicompartmental knee replacement(UKR)which was complicated by anterior knee erythema and treated with a course of cephalexin September 2020 and recent hospitalization from 10/4-10/7 forMSSAright knee jointinfections/p right UKR irrigation and debridement (08/15/20) and poly-exhange by Dr Charlann Boxer . He was initially on vancomycin but changed to Cefazolin and Rifampin per PICC line who began having nausea, paresthesias, pruritis and fever (103.10F) on 09/08/20 after taking 2 doses of his Cefazolin. IN interim he had developed a macular rash with some petechIal aspects on legs, arms, chest and back.  Blood cultures were negative and cefazolin was stopped along with rifampin.  He also had evidence of a drug-induced liver injury.  We switched him over to daptomycin and his rash has bresolved.  His hepatitis also has been resolvied.  Knee pain had been dramatically improved.  He was doing quite a bit of vigorous physical activity including replacing tile work in flooring and in the bathroom and restoring the bath and that it been torn out.  Apparently  he had a mechanical fall and stressed the knee and he has been hurting ever since then particularly in the last 24 hours.  He unfortunate developed COVID-19 infection shortly before he was going to be seeing Dr. Charlann Boxer  One symptoms that he had also developed with COVID but which I did not know about was what sounds like vertiginous symptoms.  He is having dizziness and blurry vision especially with bending over and standing up quickly or when he lies down in particular.  Initially ascribed to covid but then has not gone away  He tried removing certain medications. He had stopped opiates for pain and begun using marijuana  for pain but stopped this in case this was contributing to his symptoms.  His wife and he had read online and wondered if this could be related to his doxycyline.     Past Medical History:  Diagnosis Date  . Arthritis    OA  . COVID-19 virus infection 11/21/2020  . Diabetes mellitus without complication (HCC)    TYPE 2  . History of colon polyps 2015  . Lumbar stress fracture AGE 25   NO SX DONE, HEALED  . 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 He currently does not take nothing for it. He declines meds and is tired.     Past Surgical History:  Procedure Laterality Date  . FRACTURE SURGERY  1978   RIGHT SHOULDER PINNING  . HERNIA REPAIR     UMBILICAL 2017, GROIN FEB 1975  . I & D KNEE WITH POLY EXCHANGE Right 08/15/2020   Procedure: RIGHT UNI COMPARTMENTAL KNEE IRRIGATION AND DEBRIDEMENT KNEE WITH POLY EXCHANGE;  Surgeon: [name], MD;  Location: [facility];  Service: Orthopedics;  Laterality: Right;  . PARTIAL KNEE ARTHROPLASTY Right 07/29/2019   Procedure: UNICOMPARTMENTAL KNEE Medially;  Surgeon: [name], MD;  Location: [facility];  Service: Orthopedics;  Laterality: Right;  90 mins  . RIGHT SHOULDER PINS REMOVED      Family History  Problem Relation Age of Onset  . Hyperlipidemia Mother   . Mental illness Mother       Social History   Socioeconomic History  . Marital status: Married    Spouse  name: Not on file  . Number of children: Not on file  . Years of education: Not on file  . Highest education level: Not on file  Occupational History  . Not on file  Tobacco Use  . Smoking status: Never Smoker  . Smokeless tobacco: Never Used  Vaping Use  . Vaping Use: Never used  Substance and Sexual Activity  . Alcohol use: No  . Drug use: No  . Sexual activity: Not on file  Other Topics Concern  . Not on file  Social History Narrative  . Not on file   Social Determinants of Health   Financial Resource Strain: Not on file  Food Insecurity:  Not on file  Transportation Needs: Not on file  Physical Activity: Not on file  Stress: Not on file  Social Connections: Not on file    Allergies  Allergen Reactions  . Cefazolin Shortness Of Breath and Rash    Diffuse rash, chest pain,   . Benadryl [Diphenhydramine] Other (See Comments)    He cannot recall what happened. Maybe "took too much."  . Rifampin     LFT elevations     Current Outpatient Medications:  .  aspirin 81 MG EC tablet, Take 81 mg by mouth daily. Swallow whole., Disp: , Rfl:  .  docusate sodium (COLACE) 100 MG capsule, Take 1 capsule (100 mg total) by mouth 2 (two) times daily. (Patient not taking: No sig reported), Disp: 28 capsule, Rfl: 0 .  doxycycline (VIBRA-TABS) 100 MG tablet, Take 1 tablet (100 mg total) by mouth 2 (two) times daily., Disp: 60 tablet, Rfl: 5 .  ferrous sulfate (FERROUSUL) 325 (65 FE) MG tablet, Take 1 tablet (325 mg total) by mouth 3 (three) times daily with meals for 14 days. (Patient not taking: Reported on 09/09/2020), Disp: 42 tablet, Rfl: 0 .  Glucosamine-Chondroitin (OSTEO BI-FLEX REGULAR STRENGTH PO), Take 2 tablets by mouth daily. , Disp: , Rfl:  .  hydrOXYzine (ATARAX/VISTARIL) 25 MG tablet, Take 1 tablet (25 mg total) by mouth every 6 (six) hours as needed for itching. (Patient not taking: No sig reported), Disp: 30 tablet, Rfl: 0 .  ibuprofen (ADVIL) 200 MG tablet, Take 200 mg by mouth every 6 (six) hours as needed., Disp: , Rfl:  .  Melatonin 5 MG CAPS, Take 10 mg by mouth at bedtime. , Disp: , Rfl:  .  metFORMIN (GLUCOPHAGE-XR) 500 MG 24 hr tablet, TAKE 1 TABLET(500 MG) BY MOUTH DAILY WITH SUPPER, Disp: 90 tablet, Rfl: 1 .  methocarbamol (ROBAXIN) 500 MG tablet, Take 1 tablet (500 mg total) by mouth every 6 (six) hours as needed for muscle spasms. (Patient not taking: No sig reported), Disp: 40 tablet, Rfl: 0 .  Omega-3 Fatty Acids (FISH OIL ULTRA) 1400 MG CAPS, Take 1,400 mg by mouth daily., Disp: , Rfl:  .  OVER THE COUNTER  MEDICATION, Take 2 capsules by mouth daily. Cinsulin (Cinnamon Chromium Picolinate Vitamin D3), Disp: , Rfl:  .  polyethylene glycol (MIRALAX / GLYCOLAX) 17 g packet, Take 17 g by mouth 2 (two) times daily. (Patient not taking: No sig reported), Disp: 28 packet, Rfl: 0 .  TURMERIC PO, Take 2,000 mg by mouth daily. 1000 MG/CAPSULE, Disp: , Rfl:   Review of Systems  Constitutional: Negative for chills and fever.  HENT: Negative for congestion and sore throat.   Eyes: Positive for visual disturbance. Negative for photophobia.  Respiratory: Negative for cough, shortness of breath and wheezing.   Cardiovascular: Negative for  chest pain, palpitations and leg swelling.  Gastrointestinal: Negative for abdominal pain, blood in stool, constipation, diarrhea, nausea and vomiting.  Genitourinary: Negative for dysuria, flank pain and hematuria.  Musculoskeletal: Positive for arthralgias. Negative for back pain.  Skin: Negative for rash.  Neurological: Positive for dizziness. Negative for weakness and headaches.  Hematological: Does not bruise/bleed easily.  Psychiatric/Behavioral: Negative for agitation, confusion, hallucinations and suicidal ideas.       Objective:   Physical Exam Constitutional:      General: He is not in acute distress.    Appearance: He is well-developed. He is not diaphoretic.  HENT:     Head: Normocephalic and atraumatic.     Mouth/Throat:     Pharynx: No oropharyngeal exudate.  Eyes:     General: No scleral icterus.    Extraocular Movements: Extraocular movements intact.     Conjunctiva/sclera: Conjunctivae normal.  Cardiovascular:     Rate and Rhythm: Normal rate and regular rhythm.  Pulmonary:     Effort: Pulmonary effort is normal. No respiratory distress.     Breath sounds: No wheezing.  Abdominal:     General: There is no distension.  Musculoskeletal:        General: No tenderness.     Cervical back: Normal range of motion and neck supple.  Skin:     General: Skin is warm and dry.     Coloration: Skin is not pale.     Findings: No erythema or rash.  Neurological:     General: No focal deficit present.     Mental Status: He is alert and oriented to person, place, and time.     Motor: No abnormal muscle tone.  Psychiatric:        Mood and Affect: Mood normal.        Behavior: Behavior normal.        Thought Content: Thought content normal.        Judgment: Judgment normal.   Line is clean dry and intact September 19, 2020:    Right knee with some warmth the incision is dry and clean September 19, 2020:     Right knee September 21, 2021:      Knee  03/21/2021:     Dix Halpike maneuver attempted and did provoke dizziness but not nystagmus though with retrospect I did  Not perform it entirely correctly.    Assessment & Plan:   Vertiginous symptoms: I am referring to PT to see if they can treat this. 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  Drug rash: This is resolved.  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

## 2021-04-20 ENCOUNTER — Ambulatory Visit: Payer: 59 | Attending: Infectious Disease | Admitting: Physical Therapy

## 2021-04-20 ENCOUNTER — Other Ambulatory Visit: Payer: Self-pay

## 2021-04-20 ENCOUNTER — Encounter: Payer: Self-pay | Admitting: Physical Therapy

## 2021-04-20 DIAGNOSIS — R42 Dizziness and giddiness: Secondary | ICD-10-CM | POA: Insufficient documentation

## 2021-04-20 DIAGNOSIS — H8112 Benign paroxysmal vertigo, left ear: Secondary | ICD-10-CM | POA: Diagnosis not present

## 2021-04-20 NOTE — Therapy (Addendum)
[facility] [address]  [address] [place], [place], [address] Phone: [phone]   Fax:  [phone]  Physical Therapy Evaluation  Patient Details  Name: Jesus Stefan Sr. MRN: [id number] Date of Birth: 09/16/1956 Referring Provider (PT): Acey Lav, MD   Encounter Date: 04/20/2021   PT End of Session - 04/20/21 1045     Visit Number 1    Number of Visits 13    Date for PT Re-Evaluation 06/01/21    Authorization Type [employer]    PT Start Time 0804    PT Stop Time 0844    PT Time Calculation (min) 40 min    Activity Tolerance Patient tolerated treatment well    Behavior During Therapy [facility] for tasks assessed/performed             Past Medical History:  Diagnosis Date   Arthritis    OA   COVID-19 virus infection 11/21/2020   Diabetes mellitus without complication (HCC)    TYPE 2   History of colon polyps 2015   Lumbar stress fracture AGE 65   NO SX DONE, HEALED   Seizures (HCC)    2012, possibly related to drug use. He currently does not take nothing for it. He declines meds and is tired.    Vertigo 04/18/2021    Past Surgical History:  Procedure Laterality Date   FRACTURE SURGERY  1978   RIGHT SHOULDER PINNING   HERNIA REPAIR     UMBILICAL 2017, GROIN FEB 1975   I & D KNEE WITH POLY EXCHANGE Right 08/15/2020   Procedure: RIGHT UNI COMPARTMENTAL KNEE IRRIGATION AND DEBRIDEMENT KNEE WITH POLY EXCHANGE;  Surgeon: [name], MD;  Location: [facility];  Service: Orthopedics;  Laterality: Right;   PARTIAL KNEE ARTHROPLASTY Right 07/29/2019   Procedure: UNICOMPARTMENTAL KNEE Medially;  Surgeon: [name], MD;  Location: [facility];  Service: Orthopedics;  Laterality: Right;  90 mins   RIGHT SHOULDER PINS REMOVED      There were no vitals filed for this visit.    Subjective Assessment - 04/20/21 0806     Subjective Patient reports that around Christmas, he was putting together a desk for his wife and first  felt the dizziness. He and his family tested positive for COVID the next day- they all had vertigo as well. Vertigo fluctuates- has episodes that last 2-3 days, but can go without it some days. Worse when laying down, rolling in bed, when bending down and standing back up. Episodes last minutes-hours. Feels like spinning. Also having HAs. Denies otalgia, otorrhea, hearing loss, head trauma, around the time of onset. Better when being still. Noting some tinnitus yesterday. Reports that he hit his head on a fence last week without new changes in HA/dizziness.    Pertinent History COVID 01/22, DMII, remote lumbar stress fx, seizures, R shoulder pinning 1978, R UKA 2020 followed by I&D R with poly exchange 2021    Limitations House hold activities;Walking;Standing;Lifting    Diagnostic tests none recent    Patient Stated Goals decrease dizziness    Currently in Pain? No/denies                [facility] PT Assessment - 04/20/21 0815       Assessment   Medical Diagnosis Vertigo    Referring Provider (PT) Acey Lav, MD    Onset Date/Surgical Date 11/04/20    Next MD Visit sometime in october    Prior Therapy yes- for  knee      Precautions   Precautions None      Balance Screen   Has the patient fallen in the past 6 months No    Has the patient had a decrease in activity level because of a fear of falling?  No    Is the patient reluctant to leave their home because of a fear of falling?  No      Home Environment   Living Environment Private residence    Living Arrangements Spouse/significant other;Children    Available Help at Discharge Family    Type of Home House    Home Access Level entry    Home Layout Multi-level;Bed/bath upstairs    Home Equipment Crutches      Prior Function   Level of Independence Independent    Vocation Full time employment      Ambulation/Gait   Assistive device None    Gait Pattern Decreased stance time - right;Decreased step length -  left;Antalgic                    Vestibular Assessment - 04/20/21 0817       Oculomotor Exam   Oculomotor Alignment Normal    Ocular ROM WFL    Spontaneous Absent    Gaze-induced  Absent    Smooth Pursuits Intact   c/o mild dizziness   Saccades Undershoots   with R horizontal and vertical directions   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 d/t c/o blurred vision      Oculomotor Exam-Fixation Suppressed    Left Head Impulse intact    Right Head Impulse intact      Vestibulo-Ocular Reflex   VOR 1 Head Only (x 1 viewing) intact; c/o difficulty focusing with horizontal turns    VOR Cancellation Normal   feeling "a delay"     Positional Testing   Sidelying Test Sidelying Right;Sidelying Left      Sidelying Right   Sidelying Right Symptoms No nystagmus   dizziness on return to sit     Sidelying Left   Sidelying Left Duration 10 sec    Sidelying Left Symptoms Upbeat, left rotatory nystagmus                Objective measurements completed on examination: See above findings.        Vestibular Treatment/Exercise - 04/20/21 0001       Vestibular Treatment/Exercise   Vestibular Treatment Provided Canalith Repositioning    Canalith Repositioning Epley Manuever Left       EPLEY MANUEVER LEFT   Number of Reps  1    Overall Response  No change     RESPONSE DETAILS LEFT c/o dizziness upon sitting which resolved in ~1 minute                   PT Education - 04/20/21 1045     Education Details prognosis, POC, post-Epley instructions    Person(s) Educated Patient    Methods Explanation;Demonstration;Tactile cues;Verbal cues;Handout    Comprehension Verbalized understanding;Returned demonstration              PT Short Term Goals - 04/20/21 1052       PT SHORT TERM GOAL #1   Title Independent with initial HEP/post-Epley instructions.    Time 2    Period Weeks    Status New    Target Date 05/04/21               PT Long  Term Goals - 04/20/21 1052       PT LONG TERM GOAL #1   Title Patient will be independent with ongoing/advanced HEP for self-management at home    Time 6    Period Weeks    Status New    Target Date 06/01/21      PT LONG TERM GOAL #2   Title Patient to report tolerance for bed mobility without dizziness.    Time 6    Period Weeks    Status New    Target Date 06/01/21      PT LONG TERM GOAL #3   Title Patient to report tolerance for ADLs without dizziness.    Time 6    Period Weeks    Status New    Target Date 06/01/21      PT LONG TERM GOAL #4   Title Patient to demonstrate standing vertical/horizontal VOR with good gaze stability and 0/10 dizziness.    Time 6    Period Weeks    Status New    Target Date 06/01/21                    Plan - 04/20/21 1046     Clinical Impression Statement Patient is a 65 y/o M presenting to OPPT with c/o dizziness for the past 6 months. Describes dizziness as "spinning". Episodes last minutes-hours. Denies otalgia, otorrhea, hearing loss, head trauma, or infection around the time of onset. Does report experiencing tinnitus yesterday. Also reports having had COVID-19 infection around the time of onset. Oculomotor exam revealed undershooting with saccadic testing, mild dizziness with smooth pursuits, B convergence insufficiency, and c/o difficulty focusing with VOR cancellation and horizontal VOR. Positional testing revealed positive L sidelying test and L DH. Patient tolerated L Epley and reported understanding of post-Epley instructions. Would benefit from skilled PT services 1-2x/week as needed to address aforementioned impairments.    Personal Factors and Comorbidities Age;Comorbidity 3+;Past/Current Experience;Time since onset of injury/illness/exacerbation    Comorbidities COVID 01/22, DMII, remote lumbar stress fx, seizures, R shoulder pinning 1978, R UKA 2020 followed by I&D R with poly exchange 2021    Examination-Activity  Limitations Bed Mobility;Sleep;Bend;Squat;Stairs;Carry;Dressing;Transfers;Lift;Locomotion Level;Caring for Others    Examination-Participation Restrictions Meal Prep;Laundry;Occupation;Driving;Shop;Community Activity;Cleaning;Church    Stability/Clinical Decision Making Stable/Uncomplicated    Clinical Decision Making Low    Rehab Potential Good    PT Frequency 2x / week    PT Duration 6 weeks    PT Treatment/Interventions ADLs/Self Care Home Management;Canalith Repostioning;Cryotherapy;Electrical Stimulation;Moist Heat;Balance training;Therapeutic exercise;Therapeutic activities;Functional mobility training;Stair training;Gait training;Ultrasound;Neuromuscular re-education;Patient/family education;Manual techniques;Vestibular;Taping;Energy conservation;Dry needling;Passive range of motion    PT Next Visit Plan reassess L DH    Consulted and Agree with Plan of Care Patient             Patient will benefit from skilled therapeutic intervention in order to improve the following deficits and impairments:  Decreased activity tolerance, Pain, Decreased balance, Improper body mechanics, Dizziness, Decreased range of motion, Postural dysfunction  Visit Diagnosis: BPPV (benign paroxysmal positional vertigo), left  Dizziness and giddiness     Problem List Patient Active Problem List   Diagnosis Date Noted   Vertigo 04/18/2021   MSSA (methicillin susceptible Staphylococcus aureus) 11/21/2020   COVID-19 virus infection 11/21/2020   Drug reaction    Adverse reaction to antibiotic, initial encounter 09/09/2020   Drug-induced liver injury 09/09/2020   Prediabetes 09/09/2020   Infected right UKR 08/15/2020   Morbid obesity (HCC) 10/05/2019   S/P right UKR  07/29/2019   Healthcare maintenance 07/09/2018   Benign prostatic hyperplasia with urinary frequency 07/09/2018   Controlled type 2 diabetes mellitus without complication, without long-term current use of insulin (HCC) 07/09/2018     Anette Guarneri, PT, DPT 04/20/21 10:56 AM   [facility] [address]  [address] [place], [place], [address] Phone: [phone]   Fax:  [phone]  Name: Jesus Scalise Sr. MRN: [id number] Date of Birth: 1956/04/22

## 2021-04-23 ENCOUNTER — Encounter: Payer: Self-pay | Admitting: Physical Therapy

## 2021-04-23 ENCOUNTER — Ambulatory Visit: Payer: 59 | Admitting: Physical Therapy

## 2021-04-23 ENCOUNTER — Other Ambulatory Visit: Payer: Self-pay

## 2021-04-23 DIAGNOSIS — H8112 Benign paroxysmal vertigo, left ear: Secondary | ICD-10-CM | POA: Diagnosis not present

## 2021-04-23 DIAGNOSIS — R42 Dizziness and giddiness: Secondary | ICD-10-CM

## 2021-04-23 NOTE — Therapy (Addendum)
[facility] [address]  [place], [place], [address] Phone: [phone]   Fax:  [phone]  Physical Therapy Treatment  Patient Details  Name: Dolan Xia Sr. MRN: [id number] Date of Birth: 01-30-1956 Referring Provider (PT): Alcide Evener, MD   Encounter Date: 04/23/2021   PT End of Session - 04/23/21 1345     Visit Number 2    Number of Visits 13    Date for PT Re-Evaluation 06/01/21    Authorization Type [employer]    PT Start Time 1313    PT Stop Time 1344    PT Time Calculation (min) 31 min    Activity Tolerance Patient tolerated treatment well    Behavior During Therapy [facility] for tasks assessed/performed             Past Medical History:  Diagnosis Date   Arthritis    OA   COVID-19 virus infection 11/21/2020   Diabetes mellitus without complication ([place])    TYPE 2   History of colon polyps 2015   Lumbar stress fracture AGE 60   NO SX DONE, HEALED   Seizures ([place])    2012, possibly related to drug use. He currently does not take nothing for it. He declines meds and is tired.    Vertigo 04/18/2021    Past Surgical History:  Procedure Laterality Date   FRACTURE SURGERY  1978   RIGHT SHOULDER PINNING   HERNIA REPAIR     UMBILICAL 2229, GROIN [place] 1975   I & D KNEE WITH POLY EXCHANGE Right 08/15/2020   Procedure: RIGHT UNI COMPARTMENTAL KNEE IRRIGATION AND DEBRIDEMENT KNEE WITH POLY EXCHANGE;  Surgeon: Paralee Cancel, MD;  Location: [facility];  Service: Orthopedics;  Laterality: Right;   PARTIAL KNEE ARTHROPLASTY Right 07/29/2019   Procedure: UNICOMPARTMENTAL KNEE Medially;  Surgeon: Paralee Cancel, MD;  Location: [facility];  Service: Orthopedics;  Laterality: Right;  90 mins   RIGHT SHOULDER PINS REMOVED      There were no vitals filed for this visit.   Subjective Assessment - 04/23/21 1314     Subjective Reports that he followed the instructions given to him. Did not sleep well last night. Feels like  dizziness is slightly better.    Pertinent History COVID 01/22, DMII, remote lumbar stress fx, seizures, R shoulder pinning 1978, R UKA 2020 followed by I&D R with poly exchange 2021    Diagnostic tests none recent    Patient Stated Goals decrease dizziness    Currently in Pain? No/denies                     Vestibular Assessment - 04/23/21 0001       Positional Testing   Dix-Hallpike Dix-Hallpike Left      Dix-Hallpike Left   Dix-Hallpike Left Duration 10 sec    Dix-Hallpike Left Symptoms Upbeat, left rotatory nystagmus   2nd trial- very mild nystagmus but no dizziness; 3rd trial- negative                      Vestibular Treatment/Exercise - 04/23/21 0001        EPLEY MANUEVER LEFT   Number of Reps  2    Overall Response  Improved Symptoms     RESPONSE DETAILS LEFT no dizzines upon sitting                   PT Education - 04/23/21 1344  Education Details edu on Nestor Lewandowsky and review of post-Epley instructions    Person(s) Educated Patient    Methods Explanation;Demonstration;Handout    Comprehension Verbalized understanding              PT Short Term Goals - 04/23/21 1347       PT SHORT TERM GOAL #1   Title Independent with initial HEP/post-Epley instructions.    Time 2    Period Weeks    Status Achieved    Target Date 05/04/21               PT Long Term Goals - 04/23/21 1348       PT LONG TERM GOAL #1   Title Patient will be independent with ongoing/advanced HEP for self-management at home    Time 6    Period Weeks    Status On-going      PT LONG TERM GOAL #2   Title Patient to report tolerance for bed mobility without dizziness.    Time 6    Period Weeks    Status On-going      PT LONG TERM GOAL #3   Title Patient to report tolerance for ADLs without dizziness.    Time 6    Period Weeks    Status On-going      PT LONG TERM GOAL #4   Title Patient to demonstrate standing vertical/horizontal VOR  with good gaze stability and 0/10 dizziness.    Time 6    Period Weeks    Status On-going                   Plan - 04/23/21 1345     Clinical Impression Statement Patient arrived to session with report of slightly improved dizziness at end of session, but still remaining. Reassessed L DH, which was positive for upbeating L torsional nystagmus, Patient was treated with L Epley, which he tolerated well. 2nd trial of L DH demonstrated lower amplitude nystagmus and no dizziness. Followed with 2nd trial of L Epley. 3rd trial of L DH was negative for nystagmus and dizziness. Educated patient on post-Epley instructions and L [facility]. Patient reported feeling better at end of session.    Comorbidities COVID 01/22, DMII, remote lumbar stress fx, seizures, R shoulder pinning 1978, R UKA 2020 followed by I&D R with poly exchange 2021    PT Treatment/Interventions ADLs/Self Care Home Management;Canalith Repostioning;Cryotherapy;Electrical Stimulation;Moist Heat;Balance training;Therapeutic exercise;Therapeutic activities;Functional mobility training;Stair training;Gait training;Ultrasound;Neuromuscular re-education;Patient/family education;Manual techniques;Vestibular;Taping;Energy conservation;Dry needling;Passive range of motion    PT Next Visit Plan reassess L DH    Consulted and Agree with Plan of Care Patient             Patient will benefit from skilled therapeutic intervention in order to improve the following deficits and impairments:  Decreased activity tolerance, Pain, Decreased balance, Improper body mechanics, Dizziness, Decreased range of motion, Postural dysfunction  Visit Diagnosis: BPPV (benign paroxysmal positional vertigo), left  Dizziness and giddiness     Problem List Patient Active Problem List   Diagnosis Date Noted   Vertigo 04/18/2021   MSSA (methicillin susceptible Staphylococcus aureus) 11/21/2020   COVID-19 virus infection 11/21/2020   Drug reaction     Adverse reaction to antibiotic, initial encounter 09/09/2020   Drug-induced liver injury 09/09/2020   Prediabetes 09/09/2020   Infected right UKR 08/15/2020   Morbid obesity ([place]) 10/05/2019   S/P right UKR 07/29/2019   Healthcare maintenance 07/09/2018   Benign prostatic hyperplasia with urinary frequency  07/09/2018   Controlled type 2 diabetes mellitus without complication, without long-term current use of insulin ([place]) 07/09/2018     Janene Harvey, PT, DPT 04/23/21 4:08 PM    [facility] [address]  [place], [place], [address] Phone: [phone]   Fax:  [phone]  Name: Sie Formisano Sr. MRN: [id number] Date of Birth: 26-Dec-1955   PHYSICAL THERAPY DISCHARGE SUMMARY  Visits from Start of Care: 2  Current functional level related to goals / functional outcomes: Unable to assess; patient cancelled appointments, stating that he felt better   Remaining deficits: Unable to assess   Education / Equipment: HEP  Plan: Patient agrees to discharge.  Patient goals were partially met. Patient is being discharged due to feeling better.    Janene Harvey, PT, DPT 05/30/21 9:09 AM

## 2021-04-26 ENCOUNTER — Telehealth: Payer: Self-pay

## 2021-04-26 NOTE — Telephone Encounter (Signed)
Patient called, states he went to PT a few times and they said he does not have vertigo. He is still complaining of dizziness, headaches, head pressure, ringing in his ears, and blurry vision. He says the headaches come and go but occur almost every day and the blurry vision only occurs sometimes.   RN advised him that his symptoms sound concerning and that he should reach out to his primary care provider for work up and instructed him to go to the emergency room for severe headache and vision changes. Patient states he has been "dealing with this for six months" and he believes the doxycyline is causing increased intracranial pressure.   He last took his doxycyline last night and says he will continue to hold it. He is willing to try a different antibiotic and is requesting a referral for a CT scan. Will route to provider.   Sandie Ano, RN

## 2021-04-26 NOTE — Telephone Encounter (Addendum)
Patient scheduled for 30 minute appointment with Marcos Eke, NP 04/30/21. Patient states he will continue to take doxycyline as prescribed until his appointment.   Sandie Ano, RN

## 2021-04-27 NOTE — Telephone Encounter (Signed)
Called patient to let him know he can take a break from the doxycycline, no answer. Left HIPAA compliant voicemail requesting callback.   Sandie Ano, RN

## 2021-04-27 NOTE — Telephone Encounter (Signed)
Patient returning call, advised him that Dr. Daiva Eves is okay with him taking a break from the doxycyline and that we can look at alternate antibiotic options at his appointment on Monday. Patient verbalized understanding and has no further questions.   Sandie Ano, RN

## 2021-04-30 ENCOUNTER — Encounter: Payer: Self-pay | Admitting: Family

## 2021-04-30 ENCOUNTER — Other Ambulatory Visit: Payer: Self-pay

## 2021-04-30 ENCOUNTER — Ambulatory Visit (INDEPENDENT_AMBULATORY_CARE_PROVIDER_SITE_OTHER): Payer: 59 | Admitting: Family

## 2021-04-30 VITALS — BP 133/77 | HR 61 | Temp 98.0°F | Wt 245.0 lb

## 2021-04-30 DIAGNOSIS — A4901 Methicillin susceptible Staphylococcus aureus infection, unspecified site: Secondary | ICD-10-CM | POA: Diagnosis not present

## 2021-04-30 DIAGNOSIS — T8453XD Infection and inflammatory reaction due to internal right knee prosthesis, subsequent encounter: Secondary | ICD-10-CM | POA: Diagnosis not present

## 2021-04-30 DIAGNOSIS — R519 Headache, unspecified: Secondary | ICD-10-CM

## 2021-04-30 MED ORDER — SULFAMETHOXAZOLE-TRIMETHOPRIM 800-160 MG PO TABS: 1.0000 | ORAL_TABLET | Freq: Two times a day (BID) | ORAL | 2 refills | Status: DC

## 2021-04-30 NOTE — Assessment & Plan Note (Signed)
Mr. Saefong continues to have dizziness, headaches, and disorientation at times. It is unlikely that this is related to his doxycycline as he has been on it since mid-November without symptoms starting until January. He has removed other medications and it appears plausable to see if doxycycline is the root cause of his symptoms. Will change to Bactrim. Discussed that if symptoms worsen or do not improve it would be recommended to follow up with primary care / neurology for further evaluation.

## 2021-04-30 NOTE — Patient Instructions (Addendum)
Nice to see you.  We will STOP the doxycycline and START Bactrim (sulfamethoxazole-trimethoprim).  Plan for follow up in 1 month or sooner if needed with Dr. Daiva Eves.  Continue to monitor your symptoms - if they persist would recommend follow up with Dr. [name] for further evaluation.  Have a great day and stay safe!

## 2021-04-30 NOTE — Progress Notes (Signed)
Subjective:    Patient ID: Jesus La Sr., male    DOB: 1956/07/30, 65 y.o.   MRN: [id number]  Chief Complaint  Patient presents with   Follow-up    Pt is only taking doxy at night once a day . Pt didn't stop that the doxy  he don't a flare up of his knee, pt is having some discomfort today      HPI:  Jesus Shin. is a 65 y.o. 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   Jesus Nunez was last seen on 6/8 by Dr. Daiva Eves with concern for dizziness, headaches and disorientation that started in January. Sent to Physical Therapy with concern for vertigo. Presents today with concern that his doxycycline may be the cause of his symptoms with continued headaches, disorientation and dizziness. Has decreased his doxycycline over the past 4 days to taking it only at night and has not a small improvement in his symptoms. He has been on doxycycline since the middle of November. Symptoms wax and wane. He has concerns because of loss of work. Wife is concerned about potential for intracranial hypertension or pathology.He did have COVID back towards the end of December. There is no specific pattern to symptoms.    Allergies  Allergen Reactions   Cefazolin Shortness Of Breath and Rash    Diffuse rash, chest pain,    Benadryl [Diphenhydramine] Other (See Comments)    He cannot recall what happened. Maybe "took too much."   Rifampin     LFT elevations      Outpatient Medications Prior to Visit  Medication Sig Dispense Refill   aspirin 81 MG EC tablet Take 81 mg by mouth daily. Swallow whole.     docusate sodium (COLACE) 100 MG capsule Take 1 capsule (100 mg total) by mouth 2 (two) times daily. 28 capsule 0   Glucosamine-Chondroitin (OSTEO BI-FLEX REGULAR STRENGTH PO) Take 2 tablets by mouth daily.     hydrOXYzine  (ATARAX/VISTARIL) 25 MG tablet Take 1 tablet (25 mg total) by mouth every 6 (six) hours as needed for itching. 30 tablet 0   ibuprofen (ADVIL) 200 MG tablet Take 200 mg by mouth every 6 (six) hours as needed.     Melatonin 5 MG CAPS Take 10 mg by mouth at bedtime.     metFORMIN (GLUCOPHAGE-XR) 500 MG 24 hr tablet TAKE 1 TABLET(500 MG) BY MOUTH DAILY WITH SUPPER 90 tablet 1   methocarbamol (ROBAXIN) 500 MG tablet Take 1 tablet (500 mg total) by mouth every 6 (six) hours as needed for muscle spasms. 40 tablet 0   Omega-3 Fatty Acids (FISH OIL ULTRA) 1400 MG CAPS Take 1,400 mg by mouth daily.     OVER THE COUNTER MEDICATION Take 2 capsules by mouth daily. Cinsulin (Cinnamon Chromium Picolinate Vitamin D3)     polyethylene glycol (MIRALAX / GLYCOLAX) 17 g packet Take 17 g by mouth 2 (two) times daily. 28 packet 0   TURMERIC PO Take 2,000 mg by mouth daily. 1000 MG/CAPSULE     doxycycline (VIBRA-TABS) 100 MG tablet Take 1 tablet (100 mg total) by mouth 2 (two) times daily. 60 tablet 5   ferrous sulfate (FERROUSUL) 325 (65 FE) MG tablet Take 1 tablet (325 mg total) by mouth 3 (three) times daily with meals for 14 days. (Patient not  taking: Reported on 09/09/2020) 42 tablet 0   No facility-administered medications prior to visit.     Past Medical History:  Diagnosis Date   Arthritis    OA   COVID-19 virus infection 11/21/2020   Diabetes mellitus without complication (HCC)    TYPE 2   History of colon polyps 2015   Lumbar stress fracture AGE 42   NO SX DONE, HEALED   Seizures (HCC)    2012, possibly related to drug use. He currently does not take nothing for it. He declines meds and is tired.    Vertigo 04/18/2021     Past Surgical History:  Procedure Laterality Date   FRACTURE SURGERY  1978   RIGHT SHOULDER PINNING   HERNIA REPAIR     UMBILICAL 2017, GROIN FEB 1975   I & D KNEE WITH POLY EXCHANGE Right 08/15/2020   Procedure: RIGHT UNI COMPARTMENTAL KNEE IRRIGATION AND DEBRIDEMENT KNEE  WITH POLY EXCHANGE;  Surgeon: [name], MD;  Location: [facility];  Service: Orthopedics;  Laterality: Right;   PARTIAL KNEE ARTHROPLASTY Right 07/29/2019   Procedure: UNICOMPARTMENTAL KNEE Medially;  Surgeon: [name], MD;  Location: [facility];  Service: Orthopedics;  Laterality: Right;  90 mins   RIGHT SHOULDER PINS REMOVED         Review of Systems  Constitutional:  Negative for chills, diaphoresis, fatigue and fever.  Respiratory:  Negative for cough, chest tightness, shortness of breath and wheezing.   Cardiovascular:  Negative for chest pain.  Gastrointestinal:  Negative for abdominal pain, diarrhea, nausea and vomiting.  Neurological:  Positive for dizziness, light-headedness and headaches.     Objective:    BP 133/77   Pulse 61   Temp 98 F (36.7 C)   Wt 245 lb (111.1 kg)   SpO2 97%   BMI 37.25 kg/m  Nursing note and vital signs reviewed.  Physical Exam Constitutional:      General: He is not in acute distress.    Appearance: He is well-developed.  Cardiovascular:     Rate and Rhythm: Normal rate and regular rhythm.     Heart sounds: Normal heart sounds.  Pulmonary:     Effort: Pulmonary effort is normal.     Breath sounds: Normal breath sounds.  Skin:    General: Skin is warm and dry.  Neurological:     Mental Status: He is alert and oriented to person, place, and time.  Psychiatric:        Mood and Affect: Mood normal.     Depression screen [facility] 2/9 04/30/2021 10/10/2020 10/10/2020 09/19/2020  Decreased Interest 0 0 0 0  Down, Depressed, Hopeless 0 0 0 0  PHQ - 2 Score 0 0 0 0  Altered sleeping - 0 - -  Tired, decreased energy - 0 - -  Change in appetite - 0 - -  Feeling bad or failure about yourself  - 0 - -  Trouble concentrating - 0 - -  Moving slowly or fidgety/restless - 0 - -  Suicidal thoughts - 0 - -  PHQ-9 Score - 0 - -       Assessment & Plan:    Patient Active Problem List   Diagnosis Date Noted   Headache 04/30/2021   Vertigo  04/18/2021   MSSA (methicillin susceptible Staphylococcus aureus) 11/21/2020   COVID-19 virus infection 11/21/2020   Drug reaction    Adverse reaction to antibiotic, initial encounter 09/09/2020   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 Previous inflammatory markers are within normal limits. Discussed need for continued antimicrobial therapy with initial plan to continue through October. Plan for follow up with Dr. Daiva Eves in 1 month or sooner if needed.        Relevant Medications   sulfamethoxazole-trimethoprim (BACTRIM DS) 800-160 MG tablet     Other   MSSA (methicillin susceptible Staphylococcus aureus)   Relevant Medications   sulfamethoxazole-trimethoprim (BACTRIM DS) 800-160 MG tablet   Headache - Primary    Jesus Nunez continues to have dizziness, headaches, and disorientation at times. It is unlikely that this is related to his doxycycline as he has been on it since mid-November without symptoms starting until January. He has removed other medications and it appears plausable to see if doxycycline is the root cause of his symptoms. Will change to Bactrim. Discussed that if symptoms worsen or do not improve it would be recommended to follow up with primary care / neurology for further evaluation.          I have discontinued Jesus Nephew Sr.'s doxycycline. I am also having him start on sulfamethoxazole-trimethoprim. Additionally, I am having him maintain his Melatonin, TURMERIC PO, OVER THE COUNTER MEDICATION,  Glucosamine-Chondroitin (OSTEO BI-FLEX REGULAR STRENGTH PO), Fish Oil Ultra, ferrous sulfate, docusate sodium, polyethylene glycol, methocarbamol, hydrOXYzine, ibuprofen, aspirin, and metFORMIN.   Meds ordered this encounter  Medications   sulfamethoxazole-trimethoprim (BACTRIM DS) 800-160 MG tablet    Sig: Take 1 tablet by mouth 2 (two) times daily.    Dispense:  60 tablet    Refill:  2    Order Specific Question:   Supervising Provider    Answer:   Judyann Munson [[id number]]     Follow-up: Return in about 1 month (around 05/30/2021), or if symptoms worsen or fail to improve.   Marcos Eke, MSN, FNP-C Nurse Practitioner [facility] for Infectious Disease [facility] Main number: [phone]

## 2021-04-30 NOTE — Assessment & Plan Note (Signed)
Right knee is doing well. Previous inflammatory markers are within normal limits. Discussed need for continued antimicrobial therapy with initial plan to continue through October. Plan for follow up with Dr. Daiva Eves in 1 month or sooner if needed.

## 2021-05-02 ENCOUNTER — Ambulatory Visit: Payer: 59 | Admitting: Physical Therapy

## 2021-05-07 ENCOUNTER — Other Ambulatory Visit: Payer: Self-pay

## 2021-05-08 ENCOUNTER — Ambulatory Visit (INDEPENDENT_AMBULATORY_CARE_PROVIDER_SITE_OTHER): Payer: 59 | Admitting: Family Medicine

## 2021-05-08 ENCOUNTER — Encounter: Payer: Self-pay | Admitting: Family Medicine

## 2021-05-08 VITALS — BP 118/76 | HR 86 | Temp 98.5°F | Ht 68.0 in | Wt 232.2 lb

## 2021-05-08 DIAGNOSIS — H538 Other visual disturbances: Secondary | ICD-10-CM | POA: Diagnosis not present

## 2021-05-08 DIAGNOSIS — E119 Type 2 diabetes mellitus without complications: Secondary | ICD-10-CM

## 2021-05-08 DIAGNOSIS — Z0000 Encounter for general adult medical examination without abnormal findings: Secondary | ICD-10-CM | POA: Diagnosis not present

## 2021-05-08 DIAGNOSIS — R519 Headache, unspecified: Secondary | ICD-10-CM

## 2021-05-08 DIAGNOSIS — J301 Allergic rhinitis due to pollen: Secondary | ICD-10-CM

## 2021-05-08 MED ORDER — MOMETASONE FUROATE 50 MCG/ACT NA SUSP: 2.0000 | Freq: Every day | NASAL | 12 refills | Status: DC

## 2021-05-08 NOTE — Progress Notes (Signed)
Established Patient Office Visit  Subjective:  Patient ID: Jesus Kading., male    DOB: 1955/11/23  Age: 65 y.o. MRN: [id number]  CC:  Chief Complaint  Patient presents with   Headache    Frontal headaches   Blurred Vision    On & off    HPI Jesus Betancur Sr. presents for evaluation of a 2 to 56-month history of worsening frontal headaches.  Headaches are coming every 2 to 3 days.  Intensity of the headache is worsening.  They are responding to ibuprofen and turmeric.  Patient tells of some light sensitivity without photophobia or prodromal symptoms.  He has no history of headaches.  There is no family history of migraine headaches.  He does experience mild seasonal allergies with stuffy nose.  He denies rhinorrhea postnasal drip cough or sneeze.  There has been blurred vision and ongoing vertigo with these headaches.  Significant recent past medical history of COVID infection in January.  He had experienced some vertigo and blurred vision after that infection.  He has since seen his ophthalmologist and was told that he had no diabetic retinopathy and given prescription glasses.  He has had physical therapy for vertigo that seem to help.  Significant past medical history of infection involving his right knee replacement.  Status post IV any biotics therapy through [facility].  Continues with oral Septra.  He continues with Glucophage for his diabetes.  Tells me that his fasting sugars have been in the less than 120 range. Also reporting tinnitus with decreased hearing. He does have a noise exposure history.   Past Medical History:  Diagnosis Date   Arthritis    OA   COVID-19 virus infection 11/21/2020   Diabetes mellitus without complication (HCC)    TYPE 2   History of colon polyps 2015   Lumbar stress fracture AGE 46   NO SX DONE, HEALED   Seizures (HCC)    2012, possibly related to drug use. He currently does not take nothing for it. He declines meds and is tired.    Vertigo 04/18/2021     Past Surgical History:  Procedure Laterality Date   FRACTURE SURGERY  1978   RIGHT SHOULDER PINNING   HERNIA REPAIR     UMBILICAL 2017, GROIN FEB 1975   I & D KNEE WITH POLY EXCHANGE Right 08/15/2020   Procedure: RIGHT UNI COMPARTMENTAL KNEE IRRIGATION AND DEBRIDEMENT KNEE WITH POLY EXCHANGE;  Surgeon: [name], MD;  Location: [facility];  Service: Orthopedics;  Laterality: Right;   PARTIAL KNEE ARTHROPLASTY Right 07/29/2019   Procedure: UNICOMPARTMENTAL KNEE Medially;  Surgeon: [name], MD;  Location: [facility];  Service: Orthopedics;  Laterality: Right;  90 mins   RIGHT SHOULDER PINS REMOVED      Family History  Problem Relation Age of Onset   Hyperlipidemia Mother    Mental illness Mother     Social History   Socioeconomic History   Marital status: Married    Spouse name: Not on file   Number of children: Not on file   Years of education: Not on file   Highest education level: Not on file  Occupational History   Not on file  Tobacco Use   Smoking status: Never   Smokeless tobacco: Never  Vaping Use   Vaping Use: Never used  Substance and Sexual Activity   Alcohol use: No   Drug use: No   Sexual activity: Not on file  Other Topics Concern   Not on file  Social History Narrative   Not on file   Social Determinants of Health   Financial Resource Strain: Not on file  Food Insecurity: Not on file  Transportation Needs: Not on file  Physical Activity: Not on file  Stress: Not on file  Social Connections: Not on file  Intimate Partner Violence: Not on file    Outpatient Medications Prior to Visit  Medication Sig Dispense Refill   aspirin 81 MG EC tablet Take 81 mg by mouth daily. Swallow whole.     Glucosamine-Chondroitin (OSTEO BI-FLEX REGULAR STRENGTH PO) Take 2 tablets by mouth daily.     ibuprofen (ADVIL) 200 MG tablet Take 200 mg by mouth every 6 (six) hours as needed.     Melatonin 5 MG CAPS Take 10 mg by mouth at bedtime.     metFORMIN  (GLUCOPHAGE-XR) 500 MG 24 hr tablet TAKE 1 TABLET(500 MG) BY MOUTH DAILY WITH SUPPER 90 tablet 1   Omega-3 Fatty Acids (FISH OIL ULTRA) 1400 MG CAPS Take 1,400 mg by mouth daily.     OVER THE COUNTER MEDICATION Take 2 capsules by mouth daily. Cinsulin (Cinnamon Chromium Picolinate Vitamin D3)     sulfamethoxazole-trimethoprim (BACTRIM DS) 800-160 MG tablet Take 1 tablet by mouth 2 (two) times daily. 60 tablet 2   TURMERIC PO Take 2,000 mg by mouth daily. 1000 MG/CAPSULE     docusate sodium (COLACE) 100 MG capsule Take 1 capsule (100 mg total) by mouth 2 (two) times daily. 28 capsule 0   polyethylene glycol (MIRALAX / GLYCOLAX) 17 g packet Take 17 g by mouth 2 (two) times daily. 28 packet 0   ferrous sulfate (FERROUSUL) 325 (65 FE) MG tablet Take 1 tablet (325 mg total) by mouth 3 (three) times daily with meals for 14 days. (Patient not taking: Reported on 09/09/2020) 42 tablet 0   hydrOXYzine (ATARAX/VISTARIL) 25 MG tablet Take 1 tablet (25 mg total) by mouth every 6 (six) hours as needed for itching. 30 tablet 0   methocarbamol (ROBAXIN) 500 MG tablet Take 1 tablet (500 mg total) by mouth every 6 (six) hours as needed for muscle spasms. 40 tablet 0   No facility-administered medications prior to visit.    Allergies  Allergen Reactions   Cefazolin Shortness Of Breath and Rash    Diffuse rash, chest pain,    Benadryl [Diphenhydramine] Other (See Comments)    He cannot recall what happened. Maybe "took too much."   Rifampin     LFT elevations    ROS Review of Systems  Constitutional:  Positive for fatigue. Negative for diaphoresis, fever and unexpected weight change.  HENT:  Positive for congestion. Negative for postnasal drip, rhinorrhea and sneezing.   Eyes:  Positive for visual disturbance. Negative for photophobia.  Respiratory:  Negative for cough and shortness of breath.   Cardiovascular: Negative.   Gastrointestinal:  Negative for nausea and vomiting.  Endocrine: Negative for  polyphagia and polyuria.  Genitourinary: Negative.   Musculoskeletal:  Positive for arthralgias.  Skin: Negative.   Neurological:  Positive for dizziness and headaches. Negative for speech difficulty and weakness.     Objective:    Physical Exam Vitals and nursing note reviewed.  Constitutional:      General: He is not in acute distress.    Appearance: He is well-developed. He is not ill-appearing, toxic-appearing or diaphoretic.  HENT:     Head: Normocephalic and atraumatic.     Mouth/Throat:     Mouth: Mucous membranes are moist.  Pharynx: Oropharynx is clear.  Eyes:     Extraocular Movements: Extraocular movements intact.     Pupils: Pupils are equal, round, and reactive to light.  Cardiovascular:     Rate and Rhythm: Normal rate and regular rhythm.  Pulmonary:     Effort: Pulmonary effort is normal.     Breath sounds: Normal breath sounds.  Abdominal:     General: Bowel sounds are normal.  Musculoskeletal:     Cervical back: Normal range of motion and neck supple. No rigidity.  Lymphadenopathy:     Cervical: No cervical adenopathy.  Skin:    General: Skin is warm and dry.  Neurological:     Mental Status: He is alert and oriented to person, place, and time.     Cranial Nerves: No cranial nerve deficit, dysarthria or facial asymmetry.  Psychiatric:        Mood and Affect: Mood normal.        Behavior: Behavior normal.    BP 118/76   Pulse 86   Temp 98.5 F (36.9 C)   Ht 5\' 8"  (1.727 m)   Wt 232 lb 3.2 oz (105.3 kg)   SpO2 95%   BMI 35.31 kg/m  Wt Readings from Last 3 Encounters:  05/08/21 232 lb 3.2 oz (105.3 kg)  04/30/21 245 lb (111.1 kg)  04/18/21 244 lb 6.4 oz (110.9 kg)     Health Maintenance Due  Topic Date Due   COVID-19 Vaccine (1) Never done   FOOT EXAM  Never done   TETANUS/TDAP  Never done   Zoster Vaccines- Shingrix (1 of 2) Never done   URINE MICROALBUMIN  10/10/2020   OPHTHALMOLOGY EXAM  11/21/2020   HEMOGLOBIN A1C  02/13/2021     There are no preventive care reminders to display for this patient.  No results found for: TSH Lab Results  Component Value Date   WBC 6.2 03/21/2021   HGB 16.7 03/21/2021   HCT 49.6 03/21/2021   MCV 89.7 03/21/2021   PLT 258 03/21/2021   Lab Results  Component Value Date   NA 140 03/21/2021   K 4.9 03/21/2021   CO2 27 03/21/2021   GLUCOSE 104 (H) 03/21/2021   BUN 27 (H) 03/21/2021   CREATININE 0.95 03/21/2021   BILITOT 0.4 03/21/2021   ALKPHOS 63 09/11/2020   AST 19 03/21/2021   ALT 18 03/21/2021   PROT 6.7 03/21/2021   ALBUMIN 3.2 (L) 09/11/2020   CALCIUM 10.1 03/21/2021   ANIONGAP 8 09/11/2020   GFR 84.13 10/11/2019   Lab Results  Component Value Date   CHOL 281 (H) 09/14/2020   Lab Results  Component Value Date   HDL 71.70 09/14/2020   Lab Results  Component Value Date   LDLCALC 197 (H) 09/14/2020   Lab Results  Component Value Date   TRIG 61.0 09/14/2020   Lab Results  Component Value Date   CHOLHDL 4 09/14/2020   Lab Results  Component Value Date   HGBA1C 6.3 (H) 08/15/2020      Assessment & Plan:   Problem List Items Addressed This Visit       Endocrine   Controlled type 2 diabetes mellitus without complication, without long-term current use of insulin (HCC) - Primary   Relevant Orders   Basic metabolic panel   Hemoglobin A1c   Urinalysis, Routine w reflex microscopic   Microalbumin / creatinine urine ratio     Other   Healthcare maintenance   Relevant Orders   Basic metabolic  panel   Hepatic function panel   Lipid panel   PSA   Headache   Relevant Orders   MR Brain W Wo Contrast   Other Visit Diagnoses     Blurred vision, bilateral       Seasonal allergic rhinitis due to pollen       Relevant Medications   mometasone (NASONEX) 50 MCG/ACT nasal spray       Meds ordered this encounter  Medications   mometasone (NASONEX) 50 MCG/ACT nasal spray    Sig: Place 2 sprays into the nose daily.    Dispense:  1 each     Refill:  12     Follow-up: Return in about 5 weeks (around 06/12/2021).  Given information on headaches and seasonal allergies.  This does sound like a progressive headache and I have ordered an MRI.  Mliss Sax, MD

## 2021-05-12 ENCOUNTER — Ambulatory Visit (HOSPITAL_BASED_OUTPATIENT_CLINIC_OR_DEPARTMENT_OTHER): Payer: 59

## 2021-05-15 ENCOUNTER — Other Ambulatory Visit (INDEPENDENT_AMBULATORY_CARE_PROVIDER_SITE_OTHER): Payer: 59

## 2021-05-15 ENCOUNTER — Other Ambulatory Visit: Payer: Self-pay

## 2021-05-15 DIAGNOSIS — Z0000 Encounter for general adult medical examination without abnormal findings: Secondary | ICD-10-CM

## 2021-05-15 DIAGNOSIS — E119 Type 2 diabetes mellitus without complications: Secondary | ICD-10-CM

## 2021-05-15 LAB — LIPID PANEL
Cholesterol: 122 mg/dL (ref 0–200)
HDL: 38.9 mg/dL — ABNORMAL LOW (ref >39.00)
LDL Cholesterol: 73 mg/dL (ref 0–99)
NonHDL: 82.66
Total CHOL/HDL Ratio: 3
Triglycerides: 49 mg/dL (ref 0.0–149.0)
VLDL: 9.8 mg/dL (ref 0.0–40.0)

## 2021-05-15 LAB — MICROALBUMIN / CREATININE URINE RATIO
Creatinine,U: 117.1 mg/dL
Microalb Creat Ratio: 1.7 mg/g (ref 0.0–30.0)
Microalb, Ur: 2 mg/dL — ABNORMAL HIGH (ref 0.0–1.9)

## 2021-05-15 LAB — BASIC METABOLIC PANEL
BUN: 24 mg/dL — ABNORMAL HIGH (ref 6–23)
CO2: 27 mEq/L (ref 19–32)
Calcium: 8.9 mg/dL (ref 8.4–10.5)
Chloride: 103 mEq/L (ref 96–112)
Creatinine, Ser: 1.1 mg/dL (ref 0.40–1.50)
GFR: 70.87 mL/min (ref >60.00)
Glucose, Bld: 102 mg/dL — ABNORMAL HIGH (ref 70–99)
Potassium: 4.8 mEq/L (ref 3.5–5.1)
Sodium: 135 mEq/L (ref 135–145)

## 2021-05-15 LAB — URINALYSIS, ROUTINE W REFLEX MICROSCOPIC
Bilirubin Urine: NEGATIVE
Hgb urine dipstick: NEGATIVE
Ketones, ur: NEGATIVE
Leukocytes,Ua: NEGATIVE
Nitrite: NEGATIVE
RBC / HPF: NONE SEEN (ref 0–?)
Specific Gravity, Urine: 1.025 (ref 1.000–1.030)
Total Protein, Urine: NEGATIVE
Urine Glucose: NEGATIVE
Urobilinogen, UA: 0.2 (ref 0.0–1.0)
pH: 6 (ref 5.0–8.0)

## 2021-05-15 LAB — PSA: PSA: 1.22 ng/mL (ref 0.10–4.00)

## 2021-05-15 LAB — HEMOGLOBIN A1C: Hgb A1c MFr Bld: 6.2 % (ref 4.6–6.5)

## 2021-05-15 LAB — HEPATIC FUNCTION PANEL
ALT: 20 U/L (ref 0–53)
AST: 19 U/L (ref 0–37)
Albumin: 4 g/dL (ref 3.5–5.2)
Alkaline Phosphatase: 71 U/L (ref 39–117)
Bilirubin, Direct: 0.1 mg/dL (ref 0.0–0.3)
Total Bilirubin: 0.3 mg/dL (ref 0.2–1.2)
Total Protein: 6.4 g/dL (ref 6.0–8.3)

## 2021-05-15 NOTE — Progress Notes (Signed)
Per the orders of Dr. [name] pt is here for labs pt tolerated draw well. Pt was able to provide adequate sample of urine.

## 2021-05-19 ENCOUNTER — Other Ambulatory Visit: Payer: Self-pay | Admitting: Family Medicine

## 2021-05-19 ENCOUNTER — Ambulatory Visit (HOSPITAL_BASED_OUTPATIENT_CLINIC_OR_DEPARTMENT_OTHER)
Admission: RE | Admit: 2021-05-19 | Discharge: 2021-05-19 | Disposition: A | Discharge status: Home / Self Care | Payer: 59 | Source: Ambulatory Visit | Attending: Family Medicine | Admitting: Family Medicine

## 2021-05-19 ENCOUNTER — Other Ambulatory Visit: Payer: Self-pay

## 2021-05-19 DIAGNOSIS — R519 Headache, unspecified: Secondary | ICD-10-CM | POA: Diagnosis present

## 2021-05-21 ENCOUNTER — Telehealth: Payer: Self-pay | Admitting: Family Medicine

## 2021-05-21 NOTE — Telephone Encounter (Signed)
Pt is wanting his mometasone (NASONEX) 50 MCG/ACT nasal spray [[id number]] filled. He is saying his insurance is giving him a problem getting it filled. I think it's a PA. He came into the office very heated today.

## 2021-05-21 NOTE — Addendum Note (Signed)
Addended by: Andrez Grime on: 05/21/2021 07:57 AM   Modules accepted: Orders

## 2021-05-23 ENCOUNTER — Ambulatory Visit: Payer: 59 | Admitting: Infectious Disease

## 2021-05-23 NOTE — Telephone Encounter (Signed)
Called for PA which was approved but insurance still paying for Rx per pharmacist patient would still need to pay $175 for Rx. Flonase sent in for patient.

## 2021-06-12 ENCOUNTER — Other Ambulatory Visit: Payer: Self-pay

## 2021-06-13 ENCOUNTER — Encounter: Payer: Self-pay | Admitting: Family Medicine

## 2021-06-13 ENCOUNTER — Ambulatory Visit (INDEPENDENT_AMBULATORY_CARE_PROVIDER_SITE_OTHER): Payer: 59 | Admitting: Family Medicine

## 2021-06-13 VITALS — BP 122/70 | HR 66 | Temp 97.1°F | Ht 68.0 in | Wt 240.6 lb

## 2021-06-13 DIAGNOSIS — J301 Allergic rhinitis due to pollen: Secondary | ICD-10-CM | POA: Diagnosis not present

## 2021-06-13 DIAGNOSIS — E119 Type 2 diabetes mellitus without complications: Secondary | ICD-10-CM | POA: Diagnosis not present

## 2021-06-13 DIAGNOSIS — R519 Headache, unspecified: Secondary | ICD-10-CM | POA: Diagnosis not present

## 2021-06-13 MED ORDER — ATORVASTATIN CALCIUM 10 MG PO TABS: ORAL_TABLET | ORAL | 3 refills | Status: DC

## 2021-06-13 NOTE — Progress Notes (Signed)
Established Patient Office Visit  Subjective:  Patient ID: Jesus Digilio., male    DOB: August 10, 1956  Age: 65 y.o. MRN: [id number]  CC:  Chief Complaint  Patient presents with   Follow-up    Follow up on headaches. Discuss MRI    HPI Jesus Pooley Sr. presents for for follow-up of headaches, allergic rhinitis and MRI results.  MRI of brain did show mucosal thickening in the ethmoid sinus and fluid in the left maxillary sinus.  Headaches have improved since he started the Nasonex over the last 3 weeks and Zyrtec-D.  He denies nosebleeds.  Diabetes is well controlled.  Radiologist who read the MRI suggested that there was a hint of microvascular disease.  Patient has a favorable lipid profile but he does have a history of diabetes.  He does not smoke.  Past Medical History:  Diagnosis Date   Arthritis    OA   COVID-19 virus infection 11/21/2020   Diabetes mellitus without complication (HCC)    TYPE 2   History of colon polyps 2015   Lumbar stress fracture AGE 44   NO SX DONE, HEALED   Seizures (HCC)    2012, possibly related to drug use. He currently does not take nothing for it. He declines meds and is tired.    Vertigo 04/18/2021    Past Surgical History:  Procedure Laterality Date   FRACTURE SURGERY  1978   RIGHT SHOULDER PINNING   HERNIA REPAIR     UMBILICAL 2017, GROIN FEB 1975   I & D KNEE WITH POLY EXCHANGE Right 08/15/2020   Procedure: RIGHT UNI COMPARTMENTAL KNEE IRRIGATION AND DEBRIDEMENT KNEE WITH POLY EXCHANGE;  Surgeon: [name], MD;  Location: [facility];  Service: Orthopedics;  Laterality: Right;   PARTIAL KNEE ARTHROPLASTY Right 07/29/2019   Procedure: UNICOMPARTMENTAL KNEE Medially;  Surgeon: [name], MD;  Location: [facility];  Service: Orthopedics;  Laterality: Right;  90 mins   RIGHT SHOULDER PINS REMOVED      Family History  Problem Relation Age of Onset   Hyperlipidemia Mother    Mental illness Mother     Social History   Socioeconomic History    Marital status: Married    Spouse name: Not on file   Number of children: Not on file   Years of education: Not on file   Highest education level: Not on file  Occupational History   Not on file  Tobacco Use   Smoking status: Never   Smokeless tobacco: Never  Vaping Use   Vaping Use: Never used  Substance and Sexual Activity   Alcohol use: No   Drug use: No   Sexual activity: Not on file  Other Topics Concern   Not on file  Social History Narrative   Not on file   Social Determinants of Health   Financial Resource Strain: Not on file  Food Insecurity: Not on file  Transportation Needs: Not on file  Physical Activity: Not on file  Stress: Not on file  Social Connections: Not on file  Intimate Partner Violence: Not on file    Outpatient Medications Prior to Visit  Medication Sig Dispense Refill   aspirin 81 MG EC tablet Take 81 mg by mouth daily. Swallow whole.     ibuprofen (ADVIL) 200 MG tablet Take 200 mg by mouth every 6 (six) hours as needed.     Melatonin 5 MG CAPS Take 10 mg by mouth at bedtime.     metFORMIN (GLUCOPHAGE-XR) 500 MG  24 hr tablet TAKE 1 TABLET(500 MG) BY MOUTH DAILY WITH SUPPER 90 tablet 1   mometasone (NASONEX) 50 MCG/ACT nasal spray Place 2 sprays into the nose daily. 1 each 12   Omega-3 Fatty Acids (FISH OIL ULTRA) 1400 MG CAPS Take 1,400 mg by mouth daily.     OVER THE COUNTER MEDICATION Take 2 capsules by mouth daily. Cinsulin (Cinnamon Chromium Picolinate Vitamin D3)     sulfamethoxazole-trimethoprim (BACTRIM DS) 800-160 MG tablet Take 1 tablet by mouth 2 (two) times daily. 60 tablet 2   TURMERIC PO Take 2,000 mg by mouth daily. 1000 MG/CAPSULE     Glucosamine-Chondroitin (OSTEO BI-FLEX REGULAR STRENGTH PO) Take 2 tablets by mouth daily. (Patient not taking: Reported on 06/13/2021)     No facility-administered medications prior to visit.    Allergies  Allergen Reactions   Cefazolin Shortness Of Breath and Rash    Diffuse rash, chest pain,     Benadryl [Diphenhydramine] Other (See Comments)    He cannot recall what happened. Maybe "took too much."   Rifampin     LFT elevations    ROS Review of Systems  Constitutional:  Negative for chills, diaphoresis, fatigue, fever and unexpected weight change.  HENT:  Negative for congestion, nosebleeds, postnasal drip, sinus pressure, sinus pain, sneezing and sore throat.   Eyes:  Negative for photophobia and visual disturbance.  Respiratory: Negative.    Cardiovascular: Negative.   Gastrointestinal: Negative.   Neurological:  Negative for headaches.  Psychiatric/Behavioral: Negative.       Objective:    Physical Exam Vitals and nursing note reviewed.  Constitutional:      General: He is not in acute distress.    Appearance: Normal appearance. He is not ill-appearing, toxic-appearing or diaphoretic.  HENT:     Head: Normocephalic and atraumatic.     Right Ear: Tympanic membrane, ear canal and external ear normal.     Left Ear: Tympanic membrane, ear canal and external ear normal.     Mouth/Throat:     Mouth: Mucous membranes are moist.     Pharynx: Oropharynx is clear. No oropharyngeal exudate or posterior oropharyngeal erythema.  Eyes:     General: No scleral icterus.       Right eye: No discharge.        Left eye: No discharge.     Extraocular Movements: Extraocular movements intact.     Conjunctiva/sclera: Conjunctivae normal.     Pupils: Pupils are equal, round, and reactive to light.  Cardiovascular:     Rate and Rhythm: Normal rate and regular rhythm.  Pulmonary:     Effort: Pulmonary effort is normal.     Breath sounds: Normal breath sounds.  Musculoskeletal:     Cervical back: No rigidity or tenderness.  Lymphadenopathy:     Cervical: No cervical adenopathy.  Skin:    General: Skin is warm and dry.  Neurological:     Mental Status: He is alert and oriented to person, place, and time.  Psychiatric:        Mood and Affect: Mood normal.        Behavior:  Behavior normal.    BP 122/70 (BP Location: Right Arm, Patient Position: Sitting, Cuff Size: Large)   Pulse 66   Temp (!) 97.1 F (36.2 C) (Temporal)   Ht 5\' 8"  (1.727 m)   Wt 240 lb 9.6 oz (109.1 kg)   SpO2 98%   BMI 36.58 kg/m  Wt Readings from Last 3 Encounters:  06/13/21  240 lb 9.6 oz (109.1 kg)  05/08/21 232 lb 3.2 oz (105.3 kg)  04/30/21 245 lb (111.1 kg)     Health Maintenance Due  Topic Date Due   FOOT EXAM  Never done   TETANUS/TDAP  Never done   Zoster Vaccines- Shingrix (1 of 2) Never done   OPHTHALMOLOGY EXAM  11/21/2020   INFLUENZA VACCINE  06/11/2021    There are no preventive care reminders to display for this patient.  No results found for: TSH Lab Results  Component Value Date   WBC 6.2 03/21/2021   HGB 16.7 03/21/2021   HCT 49.6 03/21/2021   MCV 89.7 03/21/2021   PLT 258 03/21/2021   Lab Results  Component Value Date   NA 135 05/15/2021   K 4.8 05/15/2021   CO2 27 05/15/2021   GLUCOSE 102 (H) 05/15/2021   BUN 24 (H) 05/15/2021   CREATININE 1.10 05/15/2021   BILITOT 0.3 05/15/2021   ALKPHOS 71 05/15/2021   AST 19 05/15/2021   ALT 20 05/15/2021   PROT 6.4 05/15/2021   ALBUMIN 4.0 05/15/2021   CALCIUM 8.9 05/15/2021   ANIONGAP 8 09/11/2020   GFR 70.87 05/15/2021   Lab Results  Component Value Date   CHOL 122 05/15/2021   Lab Results  Component Value Date   HDL 38.90 (L) 05/15/2021   Lab Results  Component Value Date   LDLCALC 73 05/15/2021   Lab Results  Component Value Date   TRIG 49.0 05/15/2021   Lab Results  Component Value Date   CHOLHDL 3 05/15/2021   Lab Results  Component Value Date   HGBA1C 6.2 05/15/2021      Assessment & Plan:   Problem List Items Addressed This Visit       Endocrine   Controlled type 2 diabetes mellitus without complication, without long-term current use of insulin (HCC) - Primary   Relevant Medications   atorvastatin (LIPITOR) 10 MG tablet     Other   Headache   Other Visit  Diagnoses     Seasonal allergic rhinitis due to pollen           Meds ordered this encounter  Medications   atorvastatin (LIPITOR) 10 MG tablet    Sig: Take one tablet twice weekly.    Dispense:  24 tablet    Refill:  3    Follow-up: Return in about 6 months (around 12/14/2021), or continue zyrtec and nasonex. take lipitor twice weekly.Mliss Sax, MD

## 2021-07-25 ENCOUNTER — Other Ambulatory Visit: Payer: Self-pay | Admitting: Family

## 2021-07-25 NOTE — Telephone Encounter (Signed)
Will continue with medication until his appointment with Dr. Daiva Eves. He has had 6 months of treatment after his prosthetic joint infection and should be okay. Will continue meds for now.

## 2021-07-25 NOTE — Telephone Encounter (Signed)
Please advise on refill, patient has appointment with Dr. Daiva Eves 10/12.

## 2021-08-22 ENCOUNTER — Ambulatory Visit: Payer: 59 | Admitting: Infectious Disease

## 2021-08-31 ENCOUNTER — Encounter: Payer: Self-pay | Admitting: Infectious Disease

## 2021-08-31 ENCOUNTER — Ambulatory Visit (INDEPENDENT_AMBULATORY_CARE_PROVIDER_SITE_OTHER): Payer: 59 | Admitting: Infectious Disease

## 2021-08-31 ENCOUNTER — Other Ambulatory Visit: Payer: Self-pay

## 2021-08-31 VITALS — BP 131/91 | HR 60 | Temp 97.9°F | Wt 240.0 lb

## 2021-08-31 DIAGNOSIS — A4901 Methicillin susceptible Staphylococcus aureus infection, unspecified site: Secondary | ICD-10-CM | POA: Diagnosis not present

## 2021-08-31 DIAGNOSIS — K719 Toxic liver disease, unspecified: Secondary | ICD-10-CM

## 2021-08-31 DIAGNOSIS — R7303 Prediabetes: Secondary | ICD-10-CM

## 2021-08-31 DIAGNOSIS — T8453XD Infection and inflammatory reaction due to internal right knee prosthesis, subsequent encounter: Secondary | ICD-10-CM | POA: Diagnosis not present

## 2021-08-31 DIAGNOSIS — R42 Dizziness and giddiness: Secondary | ICD-10-CM | POA: Diagnosis not present

## 2021-08-31 DIAGNOSIS — R519 Headache, unspecified: Secondary | ICD-10-CM

## 2021-08-31 MED ORDER — SULFAMETHOXAZOLE-TRIMETHOPRIM 800-160 MG PO TABS: 1.0000 | ORAL_TABLET | Freq: Every day | ORAL | 11 refills | Status: DC

## 2021-08-31 NOTE — Progress Notes (Signed)
Subjective:  Chief complaint: He is feeling better but has had some residual problems with headaches   Patient ID: Jesus Nunez., male    DOB: Sep 28, 1956, 65 y.o.   MRN: [id number]  HPI   Jesus Nunez. is a 65 y.o. male  medical history of type 2 diabetes, OSA, right medial unicompartmental knee replacement (UKR) which was complicated by anterior knee erythema and treated with a course of cephalexin September 2020 and recent hospitalization from 10/4-10/7 for MSSA right knee joint infection s/p right UKR irrigation and debridement (08/15/20) and poly-exhange by Dr Alvan Dame . He was initially on vancomycin but changed to Cefazolin and Rifampin per PICC line who began having nausea, paresthesias, pruritis and fever (103.88F) on 09/08/20 after taking 2 doses of his Cefazolin.   IN interim he had developed a macular rash with some petechIal aspects on legs, arms, chest and back.  Blood cultures were negative and cefazolin was stopped along with rifampin.  He also had evidence of a drug-induced liver injury.  We switched him over to daptomycin and his rash has bresolved.  His hepatitis also has been resolvied.  Knee pain had been dramatically improved.  He was doing quite a bit of vigorous physical activity including replacing tile work in flooring and in the bathroom and restoring the bath and that it been torn out.  Apparently  he had a mechanical fall and stressed the knee and he has been hurting ever since then particularly in the last 24 hours.  He unfortunate developed COVID-19 infection shortly before he was going to be seeing [name]  One symptoms that he had also developed with COVID but which I did not know about was what sounds like vertiginous symptoms.  He was  having dizziness and blurry vision especially with bending over and standing up quickly or when he lies down in particular.  Initially ascribed to covid but then has not gone away  He tried removing certain medications. He had  stopped opiates for pain and begun using marijuana for pain but stopped this in case this was contributing to his symptoms.  His wife and he had read online and wondered if this could be related to his doxycyline.  I end up referring him to physical therapy and ultimately he did have resolution of what was obviously BPVertigo with PT with 3 treatments.   He did see Terri Piedra and was changed doxycycline to Bactrim.  He has subsequently change the Bactrim to once daily instead of twice daily.  He had some concern about different medications causing headaches and whether they still could be associated with his vertiginous symptoms.  He has stopped his metformin as well.  Knee pain is relatively stable and mainly present when he stresses it.     Past Medical History:  Diagnosis Date   Arthritis    OA   COVID-19 virus infection 11/21/2020   Diabetes mellitus without complication ([facility])    TYPE 2   History of colon polyps 2015   Lumbar stress fracture AGE 2   NO SX DONE, HEALED   Seizures ([place])    2012, possibly related to drug use. He currently does not take nothing for it. He declines meds and is tired.    Vertigo 04/18/2021    Past Surgical History:  Procedure Laterality Date   FRACTURE SURGERY  1978   RIGHT SHOULDER PINNING   HERNIA REPAIR     UMBILICAL 3546, [place]   I &  D KNEE WITH POLY EXCHANGE Right 08/15/2020   Procedure: RIGHT UNI COMPARTMENTAL KNEE IRRIGATION AND DEBRIDEMENT KNEE WITH POLY EXCHANGE;  Surgeon: Paralee Cancel, MD;  Location: [facility];  Service: Orthopedics;  Laterality: Right;   PARTIAL KNEE ARTHROPLASTY Right 07/29/2019   Procedure: UNICOMPARTMENTAL KNEE Medially;  Surgeon: Paralee Cancel, MD;  Location: [facility];  Service: Orthopedics;  Laterality: Right;  90 mins   RIGHT SHOULDER PINS REMOVED      Family History  Problem Relation Age of Onset   Hyperlipidemia Mother    Mental illness Mother       Social History   Socioeconomic History    Marital status: Married    Spouse name: Not on file   Number of children: Not on file   Years of education: Not on file   Highest education level: Not on file  Occupational History   Not on file  Tobacco Use   Smoking status: Never   Smokeless tobacco: Never  Vaping Use   Vaping Use: Never used  Substance and Sexual Activity   Alcohol use: No   Drug use: No   Sexual activity: Not on file  Other Topics Concern   Not on file  Social History Narrative   Not on file   Social Determinants of Health   Financial Resource Strain: Not on file  Food Insecurity: Not on file  Transportation Needs: Not on file  Physical Activity: Not on file  Stress: Not on file  Social Connections: Not on file    Allergies  Allergen Reactions   Cefazolin Shortness Of Breath and Rash    Diffuse rash, chest pain,    Benadryl [Diphenhydramine] Other (See Comments)    He cannot recall what happened. Maybe "took too much."   Rifampin     LFT elevations     Current Outpatient Medications:    aspirin 81 MG EC tablet, Take 81 mg by mouth daily. Swallow whole., Disp: , Rfl:    atorvastatin (LIPITOR) 10 MG tablet, Take one tablet twice weekly., Disp: 24 tablet, Rfl: 3   ibuprofen (ADVIL) 200 MG tablet, Take 200 mg by mouth every 6 (six) hours as needed., Disp: , Rfl:    mometasone (NASONEX) 50 MCG/ACT nasal spray, Place 2 sprays into the nose daily., Disp: 1 each, Rfl: 12   Omega-3 Fatty Acids (FISH OIL ULTRA) 1400 MG CAPS, Take 1,400 mg by mouth daily., Disp: , Rfl:    OVER THE COUNTER MEDICATION, Take 2 capsules by mouth daily. Cinsulin (Cinnamon Chromium Picolinate Vitamin D3), Disp: , Rfl:    sulfamethoxazole-trimethoprim (BACTRIM DS) 800-160 MG tablet, TAKE 1 TABLET BY MOUTH TWICE DAILY, Disp: 60 tablet, Rfl: 0   TURMERIC PO, Take 2,000 mg by mouth daily. 1000 MG/CAPSULE, Disp: , Rfl:    Glucosamine-Chondroitin (OSTEO BI-FLEX REGULAR STRENGTH PO), Take 2 tablets by mouth daily. (Patient not taking:  Reported on 06/13/2021), Disp: , Rfl:    Melatonin 5 MG CAPS, Take 10 mg by mouth at bedtime., Disp: , Rfl:    metFORMIN (GLUCOPHAGE-XR) 500 MG 24 hr tablet, TAKE 1 TABLET(500 MG) BY MOUTH DAILY WITH SUPPER (Patient not taking: Reported on 08/31/2021), Disp: 90 tablet, Rfl: 1  Review of Systems  Constitutional:  Negative for activity change, appetite change, chills, diaphoresis, fatigue, fever and unexpected weight change.  HENT:  Negative for congestion, rhinorrhea, sinus pressure, sneezing, sore throat and trouble swallowing.   Eyes:  Negative for photophobia and visual disturbance.  Respiratory:  Negative for cough, chest tightness,  shortness of breath, wheezing and stridor.   Cardiovascular:  Negative for chest pain, palpitations and leg swelling.  Gastrointestinal:  Negative for abdominal distention, abdominal pain, anal bleeding, blood in stool, constipation, diarrhea, nausea and vomiting.  Genitourinary:  Negative for difficulty urinating, dysuria, flank pain and hematuria.  Musculoskeletal:  Negative for arthralgias, back pain, gait problem, joint swelling and myalgias.  Skin:  Negative for color change, pallor, rash and wound.  Neurological:  Positive for dizziness and headaches. Negative for tremors, weakness and light-headedness.  Hematological:  Negative for adenopathy. Does not bruise/bleed easily.  Psychiatric/Behavioral:  Negative for agitation, behavioral problems, confusion, decreased concentration, dysphoric mood and sleep disturbance.       Objective:   Physical Exam Constitutional:      Appearance: He is well-developed.  HENT:     Head: Normocephalic and atraumatic.  Eyes:     Conjunctiva/sclera: Conjunctivae normal.  Cardiovascular:     Rate and Rhythm: Normal rate and regular rhythm.  Pulmonary:     Effort: Pulmonary effort is normal. No respiratory distress.     Breath sounds: No wheezing.  Abdominal:     General: There is no distension.     Palpations: Abdomen  is soft.  Musculoskeletal:        General: No tenderness. Normal range of motion.     Cervical back: Normal range of motion and neck supple.  Skin:    General: Skin is warm and dry.     Coloration: Skin is not pale.     Findings: No erythema or rash.  Neurological:     General: No focal deficit present.     Mental Status: He is alert and oriented to person, place, and time.  Psychiatric:        Mood and Affect: Mood normal.        Behavior: Behavior normal.        Thought Content: Thought content normal.        Judgment: Judgment normal.  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  He is very concerned about the possibility of infection recurring to the point where he might need surgery.  We discussed various options moving forward including trialing him off antibiotics versus second option which would be to continue him on his once daily Bactrim.  He would like to go with option #2  I will check ESR, CRP, BMP CBC w differential  I will continue Bactrim DS once daily (I emphasized to him that this is not a therapeutic dose)   Vertiginous symptoms: This is clearly an inner ear problem and not related to the doxycycline but he still believes there may be some association.  Headaches: Not clear to me that this is associate with any the medications he is been on either.  Prediabetes he is taken self off metformin but asked to check an A1c today which I will do.    Drug-induced liver injury resolved

## 2021-09-01 LAB — CBC WITH DIFFERENTIAL/PLATELET
Absolute Monocytes: 1138 cells/uL — ABNORMAL HIGH (ref 200–950)
Basophils Absolute: 32 cells/uL (ref 0–200)
Basophils Relative: 0.4 %
Eosinophils Absolute: 245 cells/uL (ref 15–500)
Eosinophils Relative: 3.1 %
HCT: 48 % (ref 38.5–50.0)
Hemoglobin: 16.1 g/dL (ref 13.2–17.1)
Lymphs Abs: 1454 cells/uL (ref 850–3900)
MCH: 30.5 pg (ref 27.0–33.0)
MCHC: 33.5 g/dL (ref 32.0–36.0)
MCV: 90.9 fL (ref 80.0–100.0)
MPV: 8.7 fL (ref 7.5–12.5)
Monocytes Relative: 14.4 %
Neutro Abs: 5032 cells/uL (ref 1500–7800)
Neutrophils Relative %: 63.7 %
Platelets: 264 10*3/uL (ref 140–400)
RBC: 5.28 10*6/uL (ref 4.20–5.80)
RDW: 13.1 % (ref 11.0–15.0)
Total Lymphocyte: 18.4 %
WBC: 7.9 10*3/uL (ref 3.8–10.8)

## 2021-09-01 LAB — HEMOGLOBIN A1C
Hgb A1c MFr Bld: 5.6 % of total Hgb (ref <5.7)
Mean Plasma Glucose: 114 mg/dL
eAG (mmol/L): 6.3 mmol/L

## 2021-09-01 LAB — BASIC METABOLIC PANEL WITH GFR
BUN: 21 mg/dL (ref 7–25)
CO2: 26 mmol/L (ref 20–32)
Calcium: 8.7 mg/dL (ref 8.6–10.3)
Chloride: 102 mmol/L (ref 98–110)
Creat: 1.06 mg/dL (ref 0.70–1.35)
Glucose, Bld: 85 mg/dL (ref 65–99)
Potassium: 4.4 mmol/L (ref 3.5–5.3)
Sodium: 136 mmol/L (ref 135–146)
eGFR: 78 mL/min/{1.73_m2} (ref >=60)

## 2021-09-01 LAB — SEDIMENTATION RATE: Sed Rate: 2 mm/h (ref 0–20)

## 2021-09-01 LAB — C-REACTIVE PROTEIN: CRP: 13.7 mg/L — ABNORMAL HIGH (ref <8.0)

## 2021-10-15 ENCOUNTER — Encounter: Payer: 59 | Admitting: Family Medicine

## 2021-11-07 IMAGING — MR MR HEAD W/O CM
12 series · 48 of 48 positions shown · non-contrast
Comparison: None.

CLINICAL DATA: Headache.

EXAM:
MRI HEAD WITHOUT CONTRAST
TECHNIQUE: Multiplanar, multiecho pulse sequences of the brain and surrounding
structures were obtained without intravenous contrast.

[Series 2: T1 · sagittal · 5.0mm · 0.47mm/px · 2 of 25 slices shown]
[im 1/25]
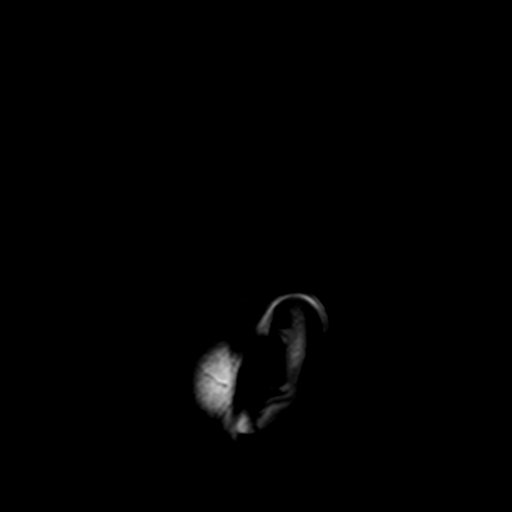
[im 25/25]
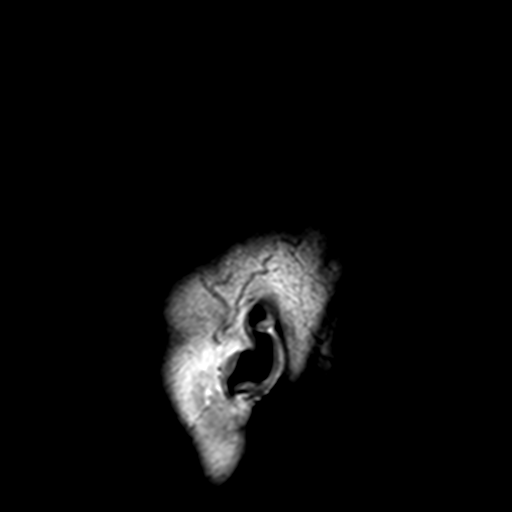

[Series 3: DWI · axial · 3.0mm · 2.19mm/px · z∈[-68,+102]mm · 7 of 116 slices shown (1 of 4)]
[im 1/116]
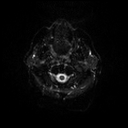
[im 20/116]
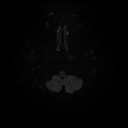
[im 39/116]
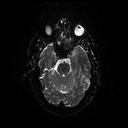
[im 58/116]
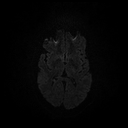
[im 77/116]
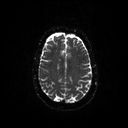
[im 96/116]
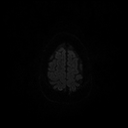
[im 116/116]
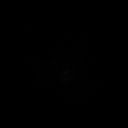

[Series 4: DWI · axial · 3.0mm · 2.19mm/px · z∈[-68,+102]mm · 3 of 58 slices shown (2 of 4)]
[im 1/58]
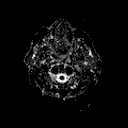
[im 29/58]
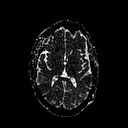
[im 58/58]
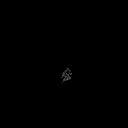

[Series 5: DWI · coronal · 5.0mm · 1.46mm/px · 5 of 84 slices shown (3 of 4)]
[im 1/84]
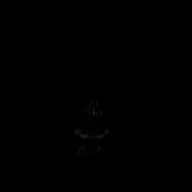
[im 21/84]
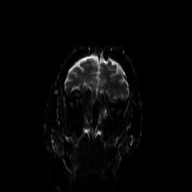
[im 42/84]
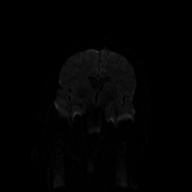
[im 63/84]
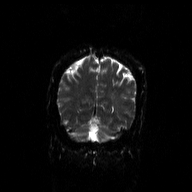
[im 84/84]
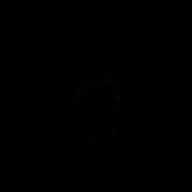

[Series 6: DWI · coronal · 5.0mm · 1.46mm/px · 2 of 42 slices shown (4 of 4)]
[im 1/42]
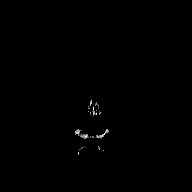
[im 42/42]
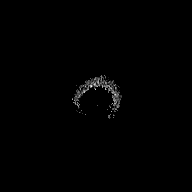

[Series 7: T2 · axial · 5.0mm · 0.45mm/px · 1 of 25 slices shown (1 of 2)]
[im 1/25]
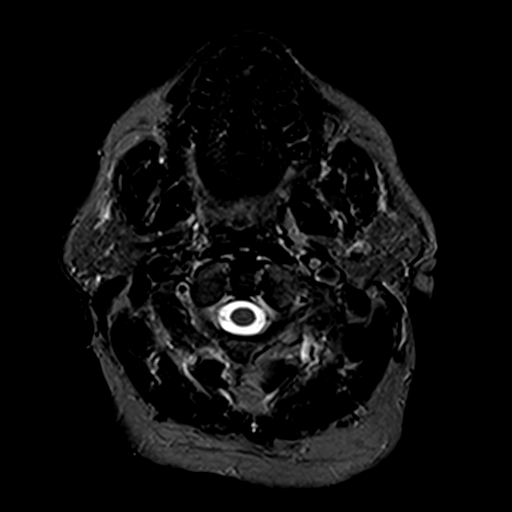

[Series 8: t2_tirm_tra_dark-fluid · axial · 3.0mm · 0.45mm/px · z∈[-73,+102]mm · 3 of 45 slices shown]
[im 1/45]
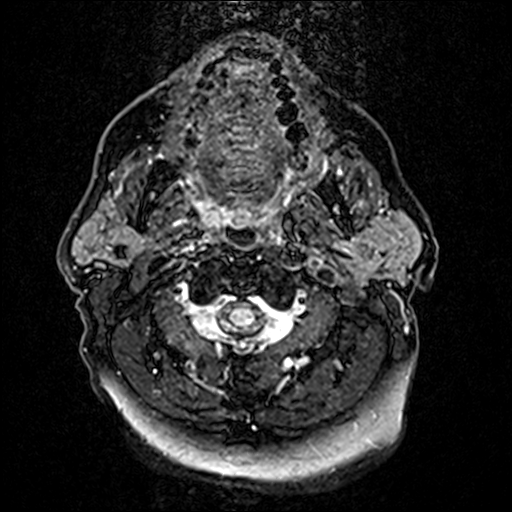
[im 23/45]
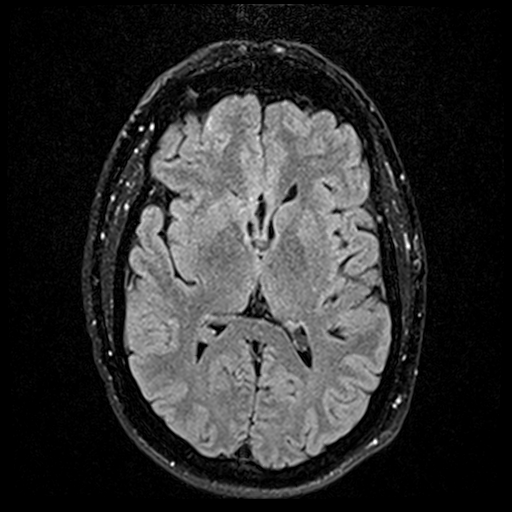
[im 45/45]
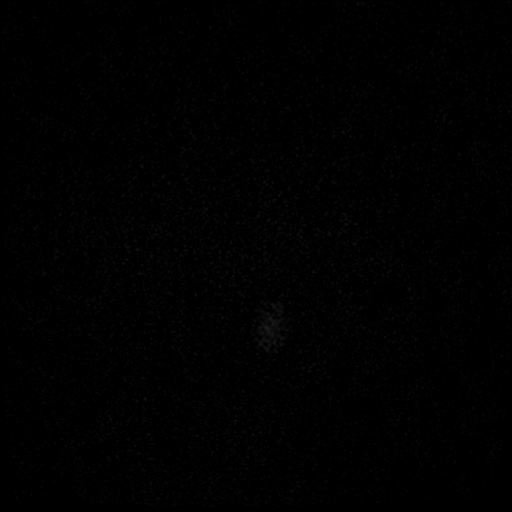

[Series 9: t1_mprage_tra · axial · 1.0mm · 0.90mm/px · z∈[-85,+90]mm · 10 of 176 slices shown]
[im 1/176]
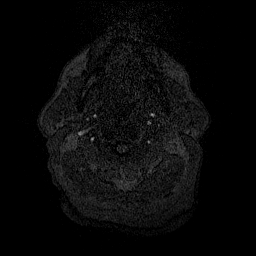
[im 20/176]
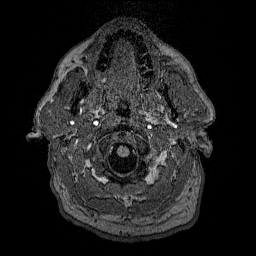
[im 39/176]
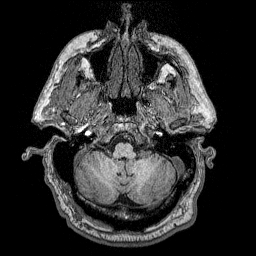
[im 59/176]
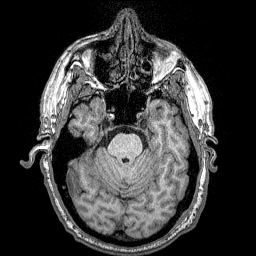
[im 78/176]
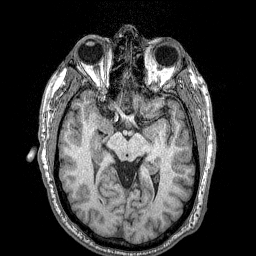
[im 98/176]
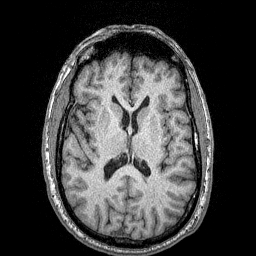
[im 117/176]
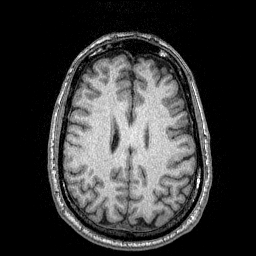
[im 137/176]
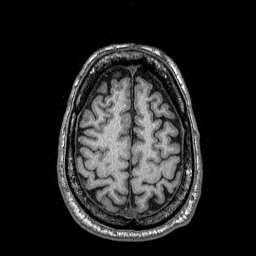
[im 156/176]
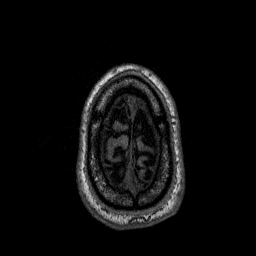
[im 176/176]
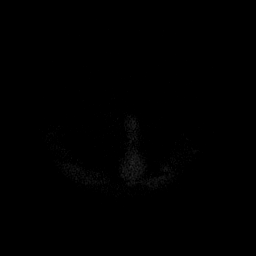

[Series 10: T2 · axial · 5.0mm · 0.45mm/px · 1 of 25 slices shown (2 of 2)]
[im 1/25]
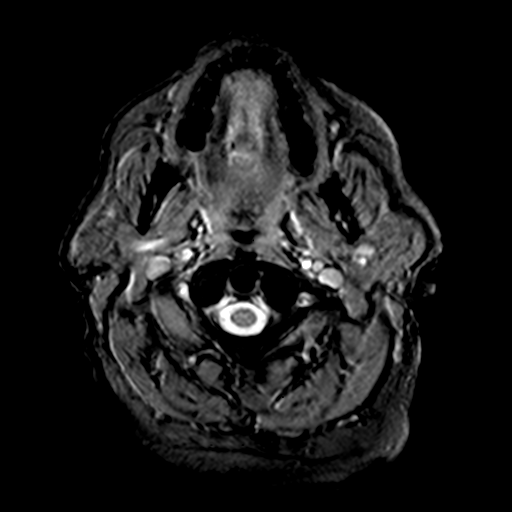

[Series 11: T2 post-contrast · coronal · 5.0mm · 0.45mm/px · 2 of 32 slices shown]
[im 1/32]
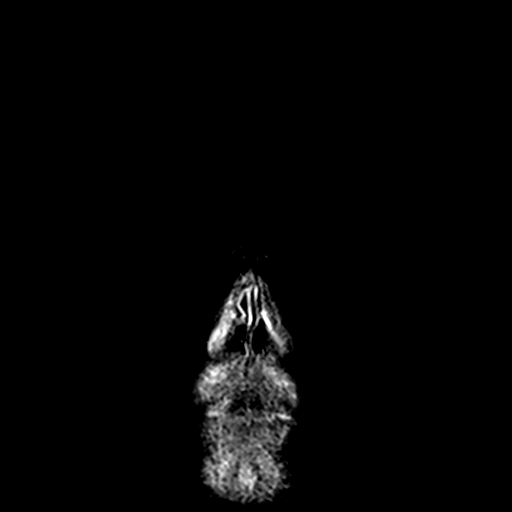
[im 32/32]
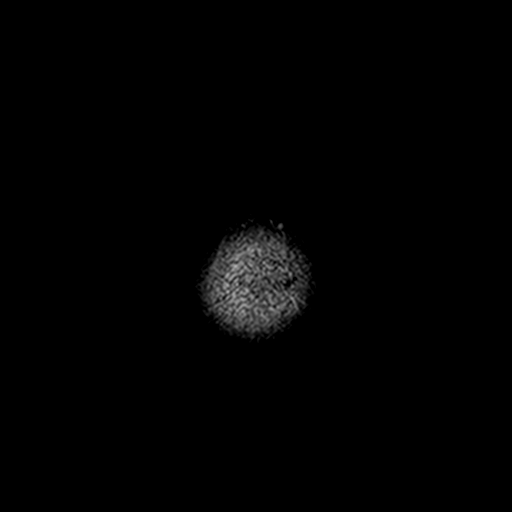

[Series 101: sag mpr pre · axial · non-contrast · 1.0mm · 0.48mm/px · z∈[+3,+116]mm · 6 of 106 slices shown]
[im 1/106]
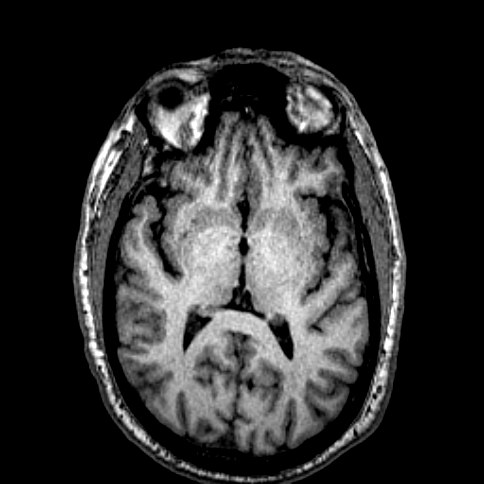
[im 22/106]
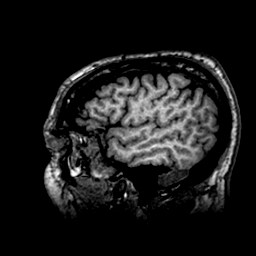
[im 43/106]
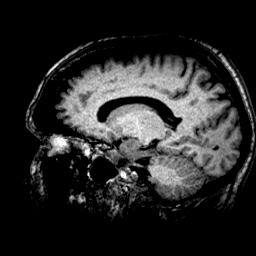
[im 64/106]
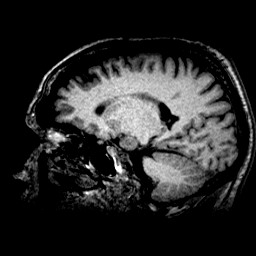
[im 85/106]
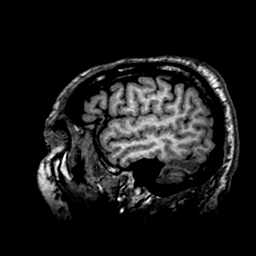
[im 106/106]
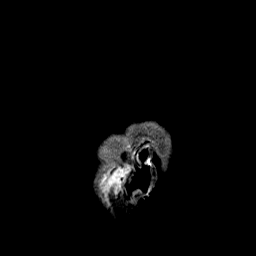

[Series 102: cor mpr pre · axial · non-contrast · 1.0mm · 0.48mm/px · z∈[+3,+110]mm · 6 of 106 slices shown]
[im 1/106]
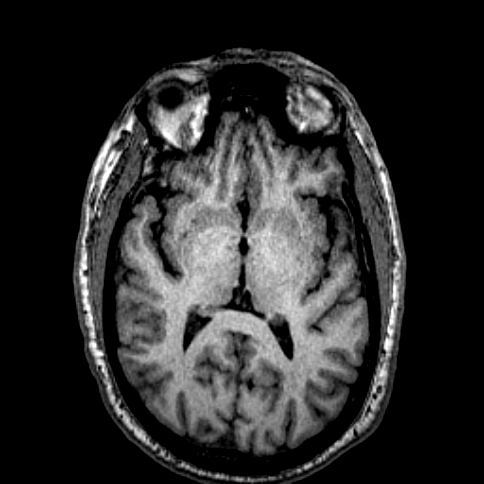
[im 22/106]
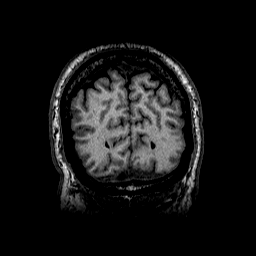
[im 43/106]
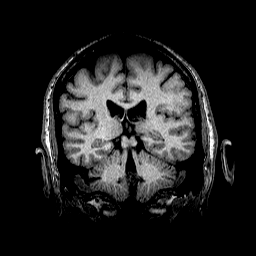
[im 64/106]
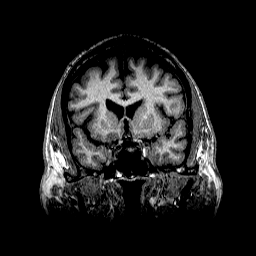
[im 85/106]
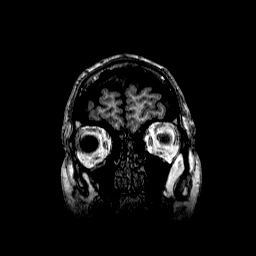
[im 106/106]
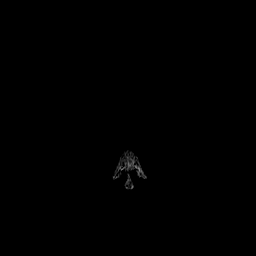

[48 of 48 positions shown; findings below may reference images not displayed]

FINDINGS: Please note that a noncontrast study was performed due to patient
refusal of contrast.

Brain: No acute infarction, hemorrhage, hydrocephalus, extra-axial
collection or mass lesion. Mild T2/FLAIR hyperintensities within the
white matter, nonspecific but potentially related to chronic
microvascular ischemic disease. Mild atrophy. Single punctate focus
of susceptibility artifact in the high right frontal lobe without
edema, most likely related to prior microhemorrhage.

Vascular: Major arterial flow voids are maintained at the skull
base. Mildly hyperintense signal in the left sigmoid/transverse
sinuses, likely related to slow flow.

Skull and upper cervical spine: Normal marrow signal.

Sinuses/Orbits: Moderate mucosal thickening of ethmoid air cells
with fluid in the left maxillary sinus.

Other: Small left mastoid effusion.
IMPRESSION: 1. No evidence of acute intracranial abnormality on this noncontrast
MRI.
2. Moderate mucosal thickening of ethmoid air cells with fluid in
the left maxillary sinus.

## 2021-12-13 ENCOUNTER — Other Ambulatory Visit: Payer: Self-pay

## 2021-12-14 ENCOUNTER — Encounter: Payer: Self-pay | Admitting: Family Medicine

## 2021-12-14 ENCOUNTER — Ambulatory Visit (INDEPENDENT_AMBULATORY_CARE_PROVIDER_SITE_OTHER): Payer: Medicare Other | Admitting: Family Medicine

## 2021-12-14 VITALS — BP 116/68 | HR 61 | Temp 97.1°F | Ht 68.0 in | Wt 228.4 lb

## 2021-12-14 DIAGNOSIS — Z0000 Encounter for general adult medical examination without abnormal findings: Secondary | ICD-10-CM

## 2021-12-14 DIAGNOSIS — E7800 Pure hypercholesterolemia, unspecified: Secondary | ICD-10-CM | POA: Diagnosis not present

## 2021-12-14 DIAGNOSIS — R7303 Prediabetes: Secondary | ICD-10-CM | POA: Diagnosis not present

## 2021-12-14 NOTE — Progress Notes (Signed)
Established Patient Office Visit  Subjective:  Patient ID: Jesus Vasek., male    DOB: 1956/07/12  Age: 66 y.o. MRN: [id number]  CC:  Chief Complaint  Patient presents with   Follow-up    6 month follow up, no concerns. Patient not fasting.     HPI Jesus Norrod Sr. presents for a physical exam and follow-up of prediabetes and elevated LDL cholesterol.  Has been able to lose 12 pounds with diet and some exercise.  Right knee continues to heal after replacement and osteomyelitis.  Has been doing well.  Is 80 year old disabled son with Down syndrome is about to aged out of the group home and may need to return home.  Patient may be his primary caregiver.  Patient's daughter is to be married this spring.  Patient experiences urinary frequency because he is drinking copious fluids.  With his weight loss nocturia has decreased.  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 He currently does not take nothing for it. He declines meds and is tired.    Vertigo 04/18/2021    Past Surgical History:  Procedure Laterality Date   FRACTURE SURGERY  1978   RIGHT SHOULDER PINNING   HERNIA REPAIR     UMBILICAL [address], GROIN [place] 1975   I & D KNEE WITH POLY EXCHANGE Right 08/15/2020   Procedure: RIGHT UNI COMPARTMENTAL KNEE IRRIGATION AND DEBRIDEMENT KNEE WITH POLY EXCHANGE;  Surgeon: Paralee Cancel, MD;  Location: [facility];  Service: Orthopedics;  Laterality: Right;   PARTIAL KNEE ARTHROPLASTY Right 07/29/2019   Procedure: UNICOMPARTMENTAL KNEE Medially;  Surgeon: Paralee Cancel, MD;  Location: [facility];  Service: Orthopedics;  Laterality: Right;  90 mins   RIGHT SHOULDER PINS REMOVED      Family History  Problem Relation Age of Onset   Hyperlipidemia Mother     Mental illness Mother     Social History   Socioeconomic History   Marital status: Married    Spouse name: Not on file   Number of children: Not on file   Years of education: Not on file   Highest education level: Not on file  Occupational History   Not on file  Tobacco Use   Smoking status: Never   Smokeless tobacco: Never  Vaping Use   Vaping Use: Never used  Substance and Sexual Activity   Alcohol use: No   Drug use: No   Sexual activity: Not on file  Other Topics Concern   Not on file  Social History Narrative   Not on file   Social Determinants of Health   Financial Resource Strain: Not on file  Food Insecurity: Not on file  Transportation Needs: Not on file  Physical Activity: Not on file  Stress: Not on file  Social Connections: Not on file  Intimate Partner Violence: Not on file    Outpatient Medications Prior to Visit  Medication Sig Dispense Refill   aspirin 81 MG EC tablet Take 81 mg by mouth daily. Swallow whole.     atorvastatin (LIPITOR) 10 MG tablet Take one tablet twice weekly. 24 tablet 3   Glucosamine-Chondroitin (OSTEO BI-FLEX REGULAR STRENGTH PO) Take 2 tablets by mouth  daily.     ibuprofen (ADVIL) 200 MG tablet Take 200 mg by mouth every 6 (six) hours as needed.     Omega-3 Fatty Acids (FISH OIL ULTRA) 1400 MG CAPS Take 1,400 mg by mouth daily.     OVER THE COUNTER MEDICATION Take 2 capsules by mouth daily. Cinsulin (Cinnamon Chromium Picolinate Vitamin D3)     sulfamethoxazole-trimethoprim (BACTRIM DS) 800-160 MG tablet Take 1 tablet by mouth daily. 30 tablet 11   TURMERIC PO Take 2,000 mg by mouth daily. 1000 MG/CAPSULE     Melatonin 5 MG CAPS Take 10 mg by mouth at bedtime.     mometasone (NASONEX) 50 MCG/ACT nasal spray Place 2 sprays into the nose daily. (Patient not taking: Reported on 12/14/2021) 1 each 12   No facility-administered medications prior to visit.    Allergies  Allergen Reactions   Cefazolin Shortness Of Breath and Rash     Diffuse rash, chest pain,    Benadryl [Diphenhydramine] Other (See Comments)    He cannot recall what happened. Maybe "took too much."   Rifampin     LFT elevations    ROS Review of Systems  Constitutional:  Negative for chills, diaphoresis, fatigue, fever and unexpected weight change.  HENT: Negative.    Eyes:  Negative for photophobia and visual disturbance.  Respiratory: Negative.    Cardiovascular: Negative.   Gastrointestinal: Negative.   Endocrine: Negative for polyphagia and polyuria.  Genitourinary:  Positive for frequency. Negative for difficulty urinating and urgency.  Musculoskeletal:  Positive for arthralgias.  Skin:  Negative for color change and pallor.  Neurological:  Negative for speech difficulty, weakness and light-headedness.  Hematological:  Does not bruise/bleed easily.  Depression screen [facility] 2/9 12/14/2021 08/31/2021 06/13/2021  Decreased Interest 0 0 0  Down, Depressed, Hopeless 0 0 0  PHQ - 2 Score 0 0 0  Altered sleeping - - -  Tired, decreased energy - - -  Change in appetite - - -  Feeling bad or failure about yourself  - - -  Trouble concentrating - - -  Moving slowly or fidgety/restless - - -  Suicidal thoughts - - -  PHQ-9 Score - - -       Objective:    Physical Exam Vitals and nursing note reviewed.  Constitutional:      General: He is not in acute distress.    Appearance: Normal appearance. He is not ill-appearing, toxic-appearing or diaphoretic.  HENT:     Head: Normocephalic and atraumatic.     Right Ear: Tympanic membrane, ear canal and external ear normal.     Left Ear: Tympanic membrane, ear canal and external ear normal.     Mouth/Throat:     Mouth: Mucous membranes are moist.     Pharynx: Oropharynx is clear. No oropharyngeal exudate or posterior oropharyngeal erythema.  Eyes:     General: No scleral icterus.       Right eye: No discharge.        Left eye: No discharge.     Extraocular Movements: Extraocular movements  intact.     Conjunctiva/sclera: Conjunctivae normal.     Pupils: Pupils are equal, round, and reactive to light.  Neck:     Vascular: No carotid bruit.  Cardiovascular:     Rate and Rhythm: Normal rate and regular rhythm.  Pulmonary:     Effort: Pulmonary effort is normal.     Breath sounds: Normal breath sounds.  Abdominal:     General: Abdomen  is flat. Bowel sounds are normal. There is no distension.     Palpations: Abdomen is soft. There is no mass.     Tenderness: There is no abdominal tenderness. There is no guarding or rebound.     Hernia: No hernia is present.  Genitourinary:    Prostate: Not enlarged, not tender and no nodules present.     Rectum: Guaiac result negative. No mass, tenderness, anal fissure, external hemorrhoid or internal hemorrhoid. Normal anal tone.  Musculoskeletal:     Cervical back: No rigidity or tenderness.  Lymphadenopathy:     Cervical: No cervical adenopathy.  Skin:    General: Skin is warm and dry.  Neurological:     Mental Status: He is alert and oriented to person, place, and time.  Psychiatric:        Mood and Affect: Mood normal.        Behavior: Behavior normal.    BP 116/68 (BP Location: Right Arm, Patient Position: Sitting, Cuff Size: Normal)    Pulse 61    Temp (!) 97.1 F (36.2 C) (Temporal)    Ht 5\' 8"  (1.727 m)    Wt 228 lb 6.4 oz (103.6 kg)    SpO2 98%    BMI 34.73 kg/m  Wt Readings from Last 3 Encounters:  12/14/21 228 lb 6.4 oz (103.6 kg)  08/31/21 240 lb (108.9 kg)  06/13/21 240 lb 9.6 oz (109.1 kg)     Health Maintenance Due  Topic Date Due   FOOT EXAM  Never done   TETANUS/TDAP  Never done   Zoster Vaccines- Shingrix (1 of 2) Never done   Pneumonia Vaccine 1+ Years old (2 - PPSV23 if available, else PCV20) 07/10/2017   OPHTHALMOLOGY EXAM  11/21/2020    There are no preventive care reminders to display for this patient.  No results found for: TSH Lab Results  Component Value Date   WBC 7.9 08/31/2021   HGB 16.1  08/31/2021   HCT 48.0 08/31/2021   MCV 90.9 08/31/2021   PLT 264 08/31/2021   Lab Results  Component Value Date   NA 136 08/31/2021   K 4.4 08/31/2021   CO2 26 08/31/2021   GLUCOSE 85 08/31/2021   BUN 21 08/31/2021   CREATININE 1.06 08/31/2021   BILITOT 0.3 05/15/2021   ALKPHOS 71 05/15/2021   AST 19 05/15/2021   ALT 20 05/15/2021   PROT 6.4 05/15/2021   ALBUMIN 4.0 05/15/2021   CALCIUM 8.7 08/31/2021   ANIONGAP 8 09/11/2020   EGFR 78 08/31/2021   GFR 70.87 05/15/2021   Lab Results  Component Value Date   CHOL 122 05/15/2021   Lab Results  Component Value Date   HDL 38.90 (L) 05/15/2021   Lab Results  Component Value Date   LDLCALC 73 05/15/2021   Lab Results  Component Value Date   TRIG 49.0 05/15/2021   Lab Results  Component Value Date   CHOLHDL 3 05/15/2021   Lab Results  Component Value Date   HGBA1C 5.6 08/31/2021      Assessment & Plan:   Problem List Items Addressed This Visit       Other   Healthcare maintenance - Primary   Relevant Orders   CBC   Comprehensive metabolic panel   Urinalysis, Routine w reflex microscopic   PSA   Prediabetes   Relevant Orders   Comprehensive metabolic panel   Hemoglobin A1c   Elevated cholesterol   Relevant Orders   Comprehensive metabolic panel  Lipid panel    No orders of the defined types were placed in this encounter.   Follow-up: Return in about 6 months (around 06/13/2022).  Continue atorvastatin.  Hopefully will be able to remove prediabetes.  Continue weight loss journey.  Follow-up in 6 months.  [name], MD

## 2021-12-17 ENCOUNTER — Other Ambulatory Visit (INDEPENDENT_AMBULATORY_CARE_PROVIDER_SITE_OTHER): Payer: Medicare Other

## 2021-12-17 ENCOUNTER — Other Ambulatory Visit: Payer: Self-pay

## 2021-12-17 DIAGNOSIS — Z0000 Encounter for general adult medical examination without abnormal findings: Secondary | ICD-10-CM

## 2021-12-17 DIAGNOSIS — R7303 Prediabetes: Secondary | ICD-10-CM | POA: Diagnosis not present

## 2021-12-17 DIAGNOSIS — E7800 Pure hypercholesterolemia, unspecified: Secondary | ICD-10-CM

## 2021-12-17 LAB — COMPREHENSIVE METABOLIC PANEL
ALT: 24 U/L (ref 0–53)
AST: 23 U/L (ref 0–37)
Albumin: 4.4 g/dL (ref 3.5–5.2)
Alkaline Phosphatase: 62 U/L (ref 39–117)
BUN: 24 mg/dL — ABNORMAL HIGH (ref 6–23)
CO2: 30 mEq/L (ref 19–32)
Calcium: 9.2 mg/dL (ref 8.4–10.5)
Chloride: 101 mEq/L (ref 96–112)
Creatinine, Ser: 1.08 mg/dL (ref 0.40–1.50)
GFR: 72.15 mL/min (ref >60.00)
Glucose, Bld: 98 mg/dL (ref 70–99)
Potassium: 4.2 mEq/L (ref 3.5–5.1)
Sodium: 138 mEq/L (ref 135–145)
Total Bilirubin: 0.7 mg/dL (ref 0.2–1.2)
Total Protein: 6.9 g/dL (ref 6.0–8.3)

## 2021-12-17 LAB — URINALYSIS, ROUTINE W REFLEX MICROSCOPIC
Bilirubin Urine: NEGATIVE
Hgb urine dipstick: NEGATIVE
Ketones, ur: NEGATIVE
Leukocytes,Ua: NEGATIVE
Nitrite: NEGATIVE
RBC / HPF: NONE SEEN (ref 0–?)
Specific Gravity, Urine: 1.025 (ref 1.000–1.030)
Total Protein, Urine: NEGATIVE
Urine Glucose: NEGATIVE
Urobilinogen, UA: 0.2 (ref 0.0–1.0)
pH: 5.5 (ref 5.0–8.0)

## 2021-12-17 LAB — CBC
HCT: 51.4 % (ref 39.0–52.0)
Hemoglobin: 16.6 g/dL (ref 13.0–17.0)
MCHC: 32.3 g/dL (ref 30.0–36.0)
MCV: 91.3 fl (ref 78.0–100.0)
Platelets: 226 10*3/uL (ref 150.0–400.0)
RBC: 5.63 Mil/uL (ref 4.22–5.81)
RDW: 13.9 % (ref 11.5–15.5)
WBC: 4.7 10*3/uL (ref 4.0–10.5)

## 2021-12-17 LAB — PSA: PSA: 1.17 ng/mL (ref 0.10–4.00)

## 2021-12-17 LAB — LIPID PANEL
Cholesterol: 116 mg/dL (ref 0–200)
HDL: 41.4 mg/dL (ref >39.00)
LDL Cholesterol: 64 mg/dL (ref 0–99)
NonHDL: 74.53
Total CHOL/HDL Ratio: 3
Triglycerides: 55 mg/dL (ref 0.0–149.0)
VLDL: 11 mg/dL (ref 0.0–40.0)

## 2021-12-17 LAB — HEMOGLOBIN A1C: Hgb A1c MFr Bld: 5.9 % (ref 4.6–6.5)

## 2022-01-16 ENCOUNTER — Telehealth: Payer: Self-pay | Admitting: Family Medicine

## 2022-01-16 NOTE — Telephone Encounter (Signed)
PAPERWORK/FORMS received  ?Dropped off by: pt ?Call back #: [phone] ?Individual made aware of 3-5 business day turn around (YES/NO): yes ?GREEN charge sheet completed and patient made aware of possible charge (YES/NO): yes ? ?Requesting to pick up 01/18/22 ? ? ?Placed in provider folder at front desk. ?~~~ route to CMA/provider Team  ?

## 2022-01-17 NOTE — Telephone Encounter (Signed)
Forms received pending fr doctor to review and sign.  ?

## 2022-01-23 NOTE — Telephone Encounter (Signed)
Pt is checking on status of this message. He is very heated,. He's wanting to know if it's ready for pickup.  ?

## 2022-01-23 NOTE — Telephone Encounter (Signed)
Paperwork filled out and signed patient aware ready for pick up ?

## 2022-03-08 ENCOUNTER — Ambulatory Visit: Payer: Medicare Other | Admitting: Infectious Disease

## 2022-03-12 ENCOUNTER — Ambulatory Visit: Payer: Medicare Other | Admitting: Infectious Disease

## 2022-03-12 ENCOUNTER — Other Ambulatory Visit: Payer: Self-pay

## 2022-03-12 ENCOUNTER — Encounter: Payer: Self-pay | Admitting: Infectious Disease

## 2022-03-12 VITALS — BP 120/78 | HR 68 | Temp 97.8°F | Ht 68.0 in | Wt 212.0 lb

## 2022-03-12 DIAGNOSIS — E119 Type 2 diabetes mellitus without complications: Secondary | ICD-10-CM

## 2022-03-12 DIAGNOSIS — T8453XD Infection and inflammatory reaction due to internal right knee prosthesis, subsequent encounter: Secondary | ICD-10-CM | POA: Diagnosis not present

## 2022-03-12 DIAGNOSIS — R7303 Prediabetes: Secondary | ICD-10-CM

## 2022-03-12 DIAGNOSIS — R42 Dizziness and giddiness: Secondary | ICD-10-CM | POA: Diagnosis not present

## 2022-03-12 DIAGNOSIS — K719 Toxic liver disease, unspecified: Secondary | ICD-10-CM | POA: Diagnosis not present

## 2022-03-12 DIAGNOSIS — A4901 Methicillin susceptible Staphylococcus aureus infection, unspecified site: Secondary | ICD-10-CM

## 2022-03-12 NOTE — Progress Notes (Signed)
? ?Subjective:  ?Chief complaint: Follow-up for prosthetic joint infection ? Patient ID: Dickie Lalan Kervin Sr., male    DOB: 05/03/1956, 66 y.o.   MRN: [id number] ? ?HPI  ? ?Dickie LaAlan Seales Sr. is a 66y.o. male  medical history of type 2 diabetes, OSA, right medial unicompartmental knee replacement (UKR) which was complicated by anterior knee erythema and treated with a course of cephalexin September 2020 and recent hospitalization from 10/4-10/7 for MSSA right knee joint infection s/p right UKR irrigation and debridement (08/15/20) and poly-exhange by Dr Charlann Boxerlin . He was initially on vancomycin but changed to Cefazolin and Rifampin per PICC line who began having nausea, paresthesias, pruritis and fever (103.45F) on 09/08/20 after taking 2 doses of his Cefazolin.   IN interim he had developed a macular rash with some petechIal aspects on legs, arms, chest and back. ? ?Blood cultures were negative and cefazolin was stopped along with rifampin.  He also had evidence of a drug-induced liver injury.  We switched him over to daptomycin and his rash has bresolved.  His hepatitis also has been resolvied. ? ?Knee pain had been dramatically improved. ? ?He was doing quite a bit of vigorous physical activity including replacing tile work in flooring and in the bathroom and restoring the bath and that it been torn out. ? ?Apparently  he had a mechanical fall and stressed the knee and he has been hurting ever since then particularly in the last 24 hours.  He unfortunate developed COVID-19 infection shortly before he was going to be seeing Dr. Charlann Boxerlin ? ?One symptoms that he had also developed with COVID but which I did not know about was what sounds like vertiginous symptoms. ? ?He was  having dizziness and blurry vision especially with bending over and standing up quickly or when he lies down in particular. ? ?Initially ascribed to covid but then has not gone away ? ?He tried removing certain medications. He had stopped opiates for pain and  begun using marijuana for pain but stopped this in case this was contributing to his symptoms. ? ?His wife and he had read online and wondered if this could be related to his doxycyline. ? ?I end up referring him to physical therapy and ultimately he did have resolution of what was obviously BPVertigo with PT with 3 treatments.  ? ?He did see Marcos EkeGreg Calone and was changed doxycycline to Bactrim.  He has subsequently change the Bactrim to once daily instead of twice daily.  He had some concern about different medications causing headaches and whether they still could be associated with his vertiginous symptoms. ? ?He has stopped his metformin as well. ? ?Since I last saw him he remains on the Bactrim once daily although he does not was remember to take it.  Knee is largely improved he is quite active on it actually but still does have pain with is not surprising since he had significant infection and likely damage to the joint. ? ?We again discussed the idea of him coming off of antibiotics but he wants to not do this until he gets through November of this year when his wife is going to be retiring. ? ?He is very afraid of needing more surgery on his knee. ? ? ? ? ? ? ?Past Medical History:  ?Diagnosis Date  ? Arthritis   ? OA  ? COVID-19 virus infection 11/21/2020  ? Diabetes mellitus without complication ([facility])   ? TYPE 2  ? History of colon polyps 2015  ?  Lumbar stress fracture AGE 66  ? NO SX DONE, HEALED  ? Seizures ([facility])   ? 2012, possibly related to drug use. He currently does not take nothing for it. He declines meds and is tired.   ? Vertigo 04/18/2021  ? ? ?Past Surgical History:  ?Procedure Laterality Date  ? FRACTURE SURGERY  1978  ? RIGHT SHOULDER PINNING  ? HERNIA REPAIR    ? UMBILICAL 2017, GROIN FEB 1975  ? I & D KNEE WITH POLY EXCHANGE Right 08/15/2020  ? Procedure: RIGHT UNI COMPARTMENTAL KNEE IRRIGATION AND DEBRIDEMENT KNEE WITH POLY EXCHANGE;  Surgeon: [name], MD;  Location: [facility];  Service:  Orthopedics;  Laterality: Right;  ? PARTIAL KNEE ARTHROPLASTY Right 07/29/2019  ? Procedure: UNICOMPARTMENTAL KNEE Medially;  Surgeon: [name], MD;  Location: [facility];  Service: Orthopedics;  Laterality: Right;  90 mins  ? RIGHT SHOULDER PINS REMOVED    ? ? ?Family History  ?Problem Relation Age of Onset  ? Hyperlipidemia Mother   ? Mental illness Mother   ? ? ?  ?Social History  ? ?Socioeconomic History  ? Marital status: Married  ?  Spouse name: Not on file  ? Number of children: Not on file  ? Years of education: Not on file  ? Highest education level: Not on file  ?Occupational History  ? Not on file  ?Tobacco Use  ? Smoking status: Never  ? Smokeless tobacco: Never  ?Vaping Use  ? Vaping Use: Never used  ?Substance and Sexual Activity  ? Alcohol use: No  ? Drug use: Not Currently  ?  Types: Marijuana  ? Sexual activity: Not on file  ?Other Topics Concern  ? Not on file  ?Social History Narrative  ? Not on file  ? ?Social Determinants of Health  ? ?Financial Resource Strain: Not on file  ?Food Insecurity: Not on file  ?Transportation Needs: Not on file  ?Physical Activity: Not on file  ?Stress: Not on file  ?Social Connections: Not on file  ? ? ?Allergies  ?Allergen Reactions  ? Cefazolin Shortness Of Breath and Rash  ?  Diffuse rash, chest pain,   ? Benadryl [Diphenhydramine] Other (See Comments)  ?  He cannot recall what happened. Maybe "took too much."  ? Rifampin   ?  LFT elevations  ? ? ? ?Current Outpatient Medications:  ?  aspirin 81 MG EC tablet, Take 81 mg by mouth daily. Swallow whole., Disp: , Rfl:  ?  atorvastatin (LIPITOR) 10 MG tablet, Take one tablet twice weekly., Disp: 24 tablet, Rfl: 3 ?  Glucosamine-Chondroitin (OSTEO BI-FLEX REGULAR STRENGTH PO), Take 2 tablets by mouth daily., Disp: , Rfl:  ?  ibuprofen (ADVIL) 200 MG tablet, Take 200 mg by mouth every 6 (six) hours as needed., Disp: , Rfl:  ?  Omega-3 Fatty Acids (FISH OIL ULTRA) 1400 MG CAPS, Take 1,400 mg by mouth daily., Disp: , Rfl:   ?  OVER THE COUNTER MEDICATION, Take 2 capsules by mouth daily. Cinsulin (Cinnamon Chromium Picolinate Vitamin D3), Disp: , Rfl:  ?  sulfamethoxazole-trimethoprim (BACTRIM DS) 800-160 MG tablet, Take 1 tablet by mouth daily., Disp: 30 tablet, Rfl: 11 ?  TURMERIC PO, Take 2,000 mg by mouth daily. 1000 MG/CAPSULE, Disp: , Rfl:  ? ?Review of Systems  ?Constitutional:  Negative for chills and fever.  ?HENT:  Negative for congestion and sore throat.   ?Eyes:  Negative for photophobia.  ?Respiratory:  Negative for cough, shortness of breath and wheezing.   ?  Cardiovascular:  Negative for chest pain, palpitations and leg swelling.  ?Gastrointestinal:  Negative for abdominal pain, blood in stool, constipation, diarrhea, nausea and vomiting.  ?Genitourinary:  Negative for dysuria, flank pain and hematuria.  ?Musculoskeletal:  Positive for arthralgias. Negative for back pain and myalgias.  ?Skin:  Negative for rash.  ?Neurological:  Negative for dizziness, weakness and headaches.  ?Hematological:  Does not bruise/bleed easily.  ?Psychiatric/Behavioral:  Negative for agitation, behavioral problems, confusion, decreased concentration, dysphoric mood, self-injury, sleep disturbance and suicidal ideas. The patient is not hyperactive.   ? ?   ?Objective:  ? Physical Exam ?Constitutional:   ?   Appearance: He is well-developed.  ?HENT:  ?   Head: Normocephalic and atraumatic.  ?Eyes:  ?   Conjunctiva/sclera: Conjunctivae normal.  ?Cardiovascular:  ?   Rate and Rhythm: Normal rate and regular rhythm.  ?Pulmonary:  ?   Effort: Pulmonary effort is normal. No respiratory distress.  ?   Breath sounds: No wheezing.  ?Abdominal:  ?   General: There is no distension.  ?   Palpations: Abdomen is soft.  ?Musculoskeletal:     ?   General: No tenderness. Normal range of motion.  ?   Cervical back: Normal range of motion and neck supple.  ?Skin: ?   General: Skin is warm and dry.  ?   Coloration: Skin is not pale.  ?   Findings: No erythema or  rash.  ?Neurological:  ?   General: No focal deficit present.  ?   Mental Status: He is alert and oriented to person, place, and time.  ?Psychiatric:     ?   Mood and Affect: Mood normal.     ?   Behavior: B

## 2022-03-13 LAB — CBC WITH DIFFERENTIAL/PLATELET
Absolute Monocytes: 496 cells/uL (ref 200–950)
Basophils Absolute: 40 cells/uL (ref 0–200)
Basophils Relative: 0.7 %
Eosinophils Absolute: 331 cells/uL (ref 15–500)
Eosinophils Relative: 5.8 %
HCT: 50.8 % — ABNORMAL HIGH (ref 38.5–50.0)
Hemoglobin: 17.2 g/dL — ABNORMAL HIGH (ref 13.2–17.1)
Lymphs Abs: 1573 cells/uL (ref 850–3900)
MCH: 30.7 pg (ref 27.0–33.0)
MCHC: 33.9 g/dL (ref 32.0–36.0)
MCV: 90.7 fL (ref 80.0–100.0)
MPV: 9.1 fL (ref 7.5–12.5)
Monocytes Relative: 8.7 %
Neutro Abs: 3260 cells/uL (ref 1500–7800)
Neutrophils Relative %: 57.2 %
Platelets: 249 10*3/uL (ref 140–400)
RBC: 5.6 10*6/uL (ref 4.20–5.80)
RDW: 12.8 % (ref 11.0–15.0)
Total Lymphocyte: 27.6 %
WBC: 5.7 10*3/uL (ref 3.8–10.8)

## 2022-03-13 LAB — COMPLETE METABOLIC PANEL WITH GFR
AG Ratio: 1.7 (calc) (ref 1.0–2.5)
ALT: 20 U/L (ref 9–46)
AST: 21 U/L (ref 10–35)
Albumin: 4.3 g/dL (ref 3.6–5.1)
Alkaline phosphatase (APISO): 78 U/L (ref 35–144)
BUN/Creatinine Ratio: 26 (calc) — ABNORMAL HIGH (ref 6–22)
BUN: 27 mg/dL — ABNORMAL HIGH (ref 7–25)
CO2: 27 mmol/L (ref 20–32)
Calcium: 9.5 mg/dL (ref 8.6–10.3)
Chloride: 105 mmol/L (ref 98–110)
Creat: 1.02 mg/dL (ref 0.70–1.35)
Globulin: 2.6 g/dL (calc) (ref 1.9–3.7)
Glucose, Bld: 111 mg/dL — ABNORMAL HIGH (ref 65–99)
Potassium: 4.9 mmol/L (ref 3.5–5.3)
Sodium: 138 mmol/L (ref 135–146)
Total Bilirubin: 0.6 mg/dL (ref 0.2–1.2)
Total Protein: 6.9 g/dL (ref 6.1–8.1)
eGFR: 82 mL/min/{1.73_m2} (ref >=60)

## 2022-03-13 LAB — SEDIMENTATION RATE: Sed Rate: 2 mm/h (ref 0–20)

## 2022-03-13 LAB — HEMOGLOBIN A1C
Hgb A1c MFr Bld: 5.4 % of total Hgb (ref <5.7)
Mean Plasma Glucose: 108 mg/dL
eAG (mmol/L): 6 mmol/L

## 2022-03-13 LAB — C-REACTIVE PROTEIN: CRP: 1.4 mg/L (ref <8.0)

## 2022-05-30 ENCOUNTER — Telehealth: Payer: Self-pay

## 2022-05-30 NOTE — Telephone Encounter (Signed)
Lft VM to rtn call to schedule an AWV.  Dm/cma  

## 2022-06-09 ENCOUNTER — Other Ambulatory Visit: Payer: Self-pay | Admitting: Family Medicine

## 2022-06-09 DIAGNOSIS — E119 Type 2 diabetes mellitus without complications: Secondary | ICD-10-CM

## 2022-06-13 ENCOUNTER — Ambulatory Visit (INDEPENDENT_AMBULATORY_CARE_PROVIDER_SITE_OTHER): Payer: Medicare Other | Admitting: Family Medicine

## 2022-06-13 ENCOUNTER — Encounter: Payer: Self-pay | Admitting: Family Medicine

## 2022-06-13 VITALS — BP 138/82 | HR 60 | Temp 97.4°F | Ht 68.0 in | Wt 220.2 lb

## 2022-06-13 DIAGNOSIS — R7303 Prediabetes: Secondary | ICD-10-CM

## 2022-06-13 DIAGNOSIS — E7800 Pure hypercholesterolemia, unspecified: Secondary | ICD-10-CM | POA: Diagnosis not present

## 2022-06-13 NOTE — Progress Notes (Signed)
Established Patient Office Visit  Subjective   Patient ID: Jesus Nunez., male    DOB: 04-10-1956  Age: 66 y.o. MRN: [id number]  Chief Complaint  Patient presents with   Follow-up    6 month follow up, patient not fasting.     HPI for follow-up of elevated cholesterol and prediabetes.  Hemoglobin A1c's have been into the normal range status post sustained weight loss and regular physical activity.  Hoping to discontinue the diagnosis.  Continues with atorvastatin for elevated cholesterol.  Says that ID is planning on discontinuing antibiotics for his osteomyelitis.  Patient disagrees with this plan.  He would like to stay on antibiotic therapy to prevent future relapse.  Busy year for him.  2 daughters are married this year.  Continues to care for his profoundly mentally challenged son.     Review of Systems  Constitutional: Negative.   HENT: Negative.    Eyes:  Negative for blurred vision, discharge and redness.  Respiratory: Negative.    Cardiovascular: Negative.   Gastrointestinal:  Negative for abdominal pain.  Genitourinary: Negative.   Musculoskeletal: Negative.  Negative for myalgias.  Skin:  Negative for rash.  Neurological:  Negative for tingling, loss of consciousness and weakness.  Endo/Heme/Allergies:  Negative for polydipsia.      Objective:     BP 138/82 (BP Location: Right Arm, Patient Position: Sitting, Cuff Size: Normal)   Pulse 60   Temp (!) 97.4 F (36.3 C) (Temporal)   Ht 5\' 8"  (1.727 m)   Wt 220 lb 3.2 oz (99.9 kg)   SpO2 98%   BMI 33.48 kg/m  BP Readings from Last 3 Encounters:  06/13/22 138/82  03/12/22 120/78  12/14/21 116/68   Wt Readings from Last 3 Encounters:  06/13/22 220 lb 3.2 oz (99.9 kg)  03/12/22 212 lb (96.2 kg)  12/14/21 228 lb 6.4 oz (103.6 kg)      Physical Exam Constitutional:      General: He is not in acute distress.    Appearance: Normal appearance. He is not ill-appearing, toxic-appearing or diaphoretic.  HENT:      Head: Normocephalic and atraumatic.     Right Ear: External ear normal.     Left Ear: External ear normal.     Mouth/Throat:     Pharynx: No posterior oropharyngeal erythema.  Eyes:     General: No scleral icterus.       Right eye: No discharge.        Left eye: No discharge.     Extraocular Movements: Extraocular movements intact.     Conjunctiva/sclera: Conjunctivae normal.  Cardiovascular:     Rate and Rhythm: Normal rate and regular rhythm.  Pulmonary:     Effort: Pulmonary effort is normal. No respiratory distress.     Breath sounds: Normal breath sounds.  Musculoskeletal:     Cervical back: No rigidity or tenderness.     Right knee: No swelling, effusion, erythema or bony tenderness. No tenderness.  Skin:    General: Skin is warm and dry.  Neurological:     Mental Status: He is alert and oriented to person, place, and time.  Psychiatric:        Mood and Affect: Mood normal.        Behavior: Behavior normal.      No results found for any visits on 06/13/22.    The ASCVD Risk score (Arnett DK, et al., 2019) failed to calculate for the following reasons:   The valid  total cholesterol range is 130 to 320 mg/dL    Assessment & Plan:   Problem List Items Addressed This Visit       Other   Prediabetes - Primary   Relevant Orders   CBC   Basic metabolic panel   Hemoglobin A1c   Elevated cholesterol   Relevant Orders   Lipid panel    Return in about 1 year (around 06/14/2023), or if symptoms worsen or fail to improve.  Discussed the importance of discontinuation of antibiotics once course of therapy is completed.  He would like to continue the antibiotics.  Offered to send him for a second opinion regarding this issue.  With continued weight loss should be able to take away the prediabetes diagnosis.  Continue atorvastatin for elevated cholesterol.  Will return fasting for above ordered blood work.  Continues to decline all vaccinations.  Mliss Sax, MD

## 2022-06-14 ENCOUNTER — Other Ambulatory Visit (INDEPENDENT_AMBULATORY_CARE_PROVIDER_SITE_OTHER): Payer: Medicare Other

## 2022-06-14 DIAGNOSIS — R7303 Prediabetes: Secondary | ICD-10-CM

## 2022-06-14 DIAGNOSIS — E7800 Pure hypercholesterolemia, unspecified: Secondary | ICD-10-CM

## 2022-06-14 LAB — BASIC METABOLIC PANEL
BUN: 28 mg/dL — ABNORMAL HIGH (ref 6–23)
CO2: 28 mEq/L (ref 19–32)
Calcium: 9.1 mg/dL (ref 8.4–10.5)
Chloride: 105 mEq/L (ref 96–112)
Creatinine, Ser: 0.98 mg/dL (ref 0.40–1.50)
GFR: 80.79 mL/min (ref >60.00)
Glucose, Bld: 96 mg/dL (ref 70–99)
Potassium: 4.6 mEq/L (ref 3.5–5.1)
Sodium: 137 mEq/L (ref 135–145)

## 2022-06-14 LAB — CBC
HCT: 48.4 % (ref 39.0–52.0)
Hemoglobin: 16.1 g/dL (ref 13.0–17.0)
MCHC: 33.3 g/dL (ref 30.0–36.0)
MCV: 91.5 fl (ref 78.0–100.0)
Platelets: 224 10*3/uL (ref 150.0–400.0)
RBC: 5.29 Mil/uL (ref 4.22–5.81)
RDW: 13.4 % (ref 11.5–15.5)
WBC: 6.3 10*3/uL (ref 4.0–10.5)

## 2022-06-14 LAB — HEMOGLOBIN A1C: Hgb A1c MFr Bld: 5.9 % (ref 4.6–6.5)

## 2022-06-14 LAB — LIPID PANEL
Cholesterol: 154 mg/dL (ref 0–200)
HDL: 49.5 mg/dL (ref >39.00)
LDL Cholesterol: 93 mg/dL (ref 0–99)
NonHDL: 104.83
Total CHOL/HDL Ratio: 3
Triglycerides: 59 mg/dL (ref 0.0–149.0)
VLDL: 11.8 mg/dL (ref 0.0–40.0)

## 2022-09-05 ENCOUNTER — Telehealth: Payer: Self-pay | Admitting: Family Medicine

## 2022-09-05 NOTE — Telephone Encounter (Signed)
Left message for patient to call back and schedule Medicare Annual Wellness Visit (AWV).   Please offer to do virtually or by telephone.  Left office number and my jabber #[phone].  AWVI eligible as of  09/11/2022  Please schedule at anytime with Nurse Health Advisor.   

## 2022-09-09 ENCOUNTER — Other Ambulatory Visit: Payer: Self-pay

## 2022-09-09 ENCOUNTER — Telehealth: Payer: Self-pay

## 2022-09-09 MED ORDER — SULFAMETHOXAZOLE-TRIMETHOPRIM 800-160 MG PO TABS: 1.0000 | ORAL_TABLET | Freq: Every day | ORAL | 0 refills | Status: DC

## 2022-09-09 NOTE — Telephone Encounter (Signed)
error 

## 2022-09-16 ENCOUNTER — Ambulatory Visit (INDEPENDENT_AMBULATORY_CARE_PROVIDER_SITE_OTHER): Payer: Medicare Other

## 2022-09-16 VITALS — Ht 68.0 in | Wt 215.0 lb

## 2022-09-16 DIAGNOSIS — Z0000 Encounter for general adult medical examination without abnormal findings: Secondary | ICD-10-CM | POA: Diagnosis not present

## 2022-09-16 NOTE — Patient Instructions (Signed)
Jesus Nunez , Thank you for taking time to come for your Medicare Wellness Visit. I appreciate your ongoing commitment to your health goals. Please review the following plan we discussed and let me know if I can assist you in the future.   Screening recommendations/referrals: Colonoscopy: completed within past 2 yrs per patient Recommended yearly ophthalmology/optometry visit for glaucoma screening and checkup Recommended yearly dental visit for hygiene and checkup  Vaccinations: Influenza vaccine: decline Pneumococcal vaccine: decline Tdap vaccine: decline Shingles vaccine: decline   Covid-19: decline  Advanced directives: Advance directive discussed with you today.   Conditions/risks identified: none  Next appointment: Follow up in one year for your annual wellness visit.   Preventive Care 66 Years and Older, Male Preventive care refers to lifestyle choices and visits with your health care provider that can promote health and wellness. What does preventive care include? A yearly physical exam. This is also called an annual well check. Dental exams once or twice a year. Routine eye exams. Ask your health care provider how often you should have your eyes checked. Personal lifestyle choices, including: Daily care of your teeth and gums. Regular physical activity. Eating a healthy diet. Avoiding tobacco and drug use. Limiting alcohol use. Practicing safe sex. Taking low doses of aspirin every day. Taking vitamin and mineral supplements as recommended by your health care provider. What happens during an annual well check? The services and screenings done by your health care provider during your annual well check will depend on your age, overall health, lifestyle risk factors, and family history of disease. Counseling  Your health care provider may ask you questions about your: Alcohol use. Tobacco use. Drug use. Emotional well-being. Home and relationship well-being. Sexual  activity. Eating habits. History of falls. Memory and ability to understand (cognition). Work and work Statistician. Screening  You may have the following tests or measurements: Height, weight, and BMI. Blood pressure. Lipid and cholesterol levels. These may be checked every 5 years, or more frequently if you are over 14 years old. Skin check. Lung cancer screening. You may have this screening every year starting at age 83 if you have a 30-pack-year history of smoking and currently smoke or have quit within the past 15 years. Fecal occult blood test (FOBT) of the stool. You may have this test every year starting at age 78. Flexible sigmoidoscopy or colonoscopy. You may have a sigmoidoscopy every 5 years or a colonoscopy every 10 years starting at age 20. Prostate cancer screening. Recommendations will vary depending on your family history and other risks. Hepatitis C blood test. Hepatitis B blood test. Sexually transmitted disease (STD) testing. Diabetes screening. This is done by checking your blood sugar (glucose) after you have not eaten for a while (fasting). You may have this done every 1-3 years. Abdominal aortic aneurysm (AAA) screening. You may need this if you are a current or former smoker. Osteoporosis. You may be screened starting at age 41 if you are at high risk. Talk with your health care provider about your test results, treatment options, and if necessary, the need for more tests. Vaccines  Your health care provider may recommend certain vaccines, such as: Influenza vaccine. This is recommended every year. Tetanus, diphtheria, and acellular pertussis (Tdap, Td) vaccine. You may need a Td booster every 10 years. Zoster vaccine. You may need this after age 1. Pneumococcal 13-valent conjugate (PCV13) vaccine. One dose is recommended after age 49. Pneumococcal polysaccharide (PPSV23) vaccine. One dose is recommended after age 61.  Talk to your health care provider about which  screenings and vaccines you need and how often you need them. This information is not intended to replace advice given to you by your health care provider. Make sure you discuss any questions you have with your health care provider. Document Released: 11/24/2015 Document Revised: 07/17/2016 Document Reviewed: 08/29/2015 Elsevier Interactive Patient Education  2017 [place] Prevention in the Home Falls can cause injuries. They can happen to people of all ages. There are many things you can do to make your home safe and to help prevent falls. What can I do on the outside of my home? Regularly fix the edges of walkways and driveways and fix any cracks. Remove anything that might make you trip as you walk through a door, such as a raised step or threshold. Trim any bushes or trees on the path to your home. Use bright outdoor lighting. Clear any walking paths of anything that might make someone trip, such as rocks or tools. Regularly check to see if handrails are loose or broken. Make sure that both sides of any steps have handrails. Any raised decks and porches should have guardrails on the edges. Have any leaves, snow, or ice cleared regularly. Use sand or salt on walking paths during winter. Clean up any spills in your garage right away. This includes oil or grease spills. What can I do in the bathroom? Use night lights. Install grab bars by the toilet and in the tub and shower. Do not use towel bars as grab bars. Use non-skid mats or decals in the tub or shower. If you need to sit down in the shower, use a plastic, non-slip stool. Keep the floor dry. Clean up any water that spills on the floor as soon as it happens. Remove soap buildup in the tub or shower regularly. Attach bath mats securely with double-sided non-slip rug tape. Do not have throw rugs and other things on the floor that can make you trip. What can I do in the bedroom? Use night lights. Make sure that you have a  light by your bed that is easy to reach. Do not use any sheets or blankets that are too big for your bed. They should not hang down onto the floor. Have a firm chair that has side arms. You can use this for support while you get dressed. Do not have throw rugs and other things on the floor that can make you trip. What can I do in the kitchen? Clean up any spills right away. Avoid walking on wet floors. Keep items that you use a lot in easy-to-reach places. If you need to reach something above you, use a strong step stool that has a grab bar. Keep electrical cords out of the way. Do not use floor polish or wax that makes floors slippery. If you must use wax, use non-skid floor wax. Do not have throw rugs and other things on the floor that can make you trip. What can I do with my stairs? Do not leave any items on the stairs. Make sure that there are handrails on both sides of the stairs and use them. Fix handrails that are broken or loose. Make sure that handrails are as long as the stairways. Check any carpeting to make sure that it is firmly attached to the stairs. Fix any carpet that is loose or worn. Avoid having throw rugs at the top or bottom of the stairs. If you do have throw  rugs, attach them to the floor with carpet tape. Make sure that you have a light switch at the top of the stairs and the bottom of the stairs. If you do not have them, ask someone to add them for you. What else can I do to help prevent falls? Wear shoes that: Do not have high heels. Have rubber bottoms. Are comfortable and fit you well. Are closed at the toe. Do not wear sandals. If you use a stepladder: Make sure that it is fully opened. Do not climb a closed stepladder. Make sure that both sides of the stepladder are locked into place. Ask someone to hold it for you, if possible. Clearly mark and make sure that you can see: Any grab bars or handrails. First and last steps. Where the edge of each step  is. Use tools that help you move around (mobility aids) if they are needed. These include: Canes. Walkers. Scooters. Crutches. Turn on the lights when you go into a dark area. Replace any light bulbs as soon as they burn out. Set up your furniture so you have a clear path. Avoid moving your furniture around. If any of your floors are uneven, fix them. If there are any pets around you, be aware of where they are. Review your medicines with your doctor. Some medicines can make you feel dizzy. This can increase your chance of falling. Ask your doctor what other things that you can do to help prevent falls. This information is not intended to replace advice given to you by your health care provider. Make sure you discuss any questions you have with your health care provider. Document Released: 08/24/2009 Document Revised: 04/04/2016 Document Reviewed: 12/02/2014 Elsevier Interactive Patient Education  2017 [employer].

## 2022-09-16 NOTE — Progress Notes (Signed)
I connected with Jesus Nunez today by telephone and verified that I am speaking with the correct person using two identifiers. Location patient: home Location provider: work Persons participating in the virtual visit: Mervin Kunglan Howk Nunez, Kyomi Hector LPN.   I discussed the limitations, risks, security and privacy concerns of performing an evaluation and management service by telephone and the availability of in person appointments. I also discussed with the patient that there may be a patient responsible charge related to this service. The patient expressed understanding and verbally consented to this telephonic visit.    Interactive audio and video telecommunications were attempted between this provider and patient, however failed, due to patient having technical difficulties OR patient did not have access to video capability.  We continued and completed visit with audio only.     Vital signs may be patient reported or missing.  Subjective:   Jesus Lalan Nauta Nunez. is a 66 y.o. male who presents for an Initial Medicare Annual Wellness Visit.  Review of Systems     Cardiac Risk Factors include: advanced age (>1855men, 53>65 women);male gender;obesity (BMI >30kg/m2)     Objective:    Today's Vitals   09/16/22 1011  Weight: 215 lb (97.5 kg)  Height: 5\' 8"  (1.727 m)   Body mass index is 32.69 kg/m.     09/16/2022   10:18 AM 04/20/2021    8:04 AM 10/04/2020   11:04 AM 09/09/2020    3:50 PM 09/09/2020    9:22 AM 08/15/2020    3:14 PM 08/14/2020   10:43 AM  Advanced Directives  Does Patient Have a Medical Advance Directive? No No No No No No No  Would patient like information on creating a medical advance directive?  No - Patient declined No - Patient declined No - Patient declined  No - Patient declined     Current Medications (verified) Outpatient Encounter Medications as of 09/16/2022  Medication Sig   aspirin 81 MG EC tablet Take 81 mg by mouth daily. Swallow whole.   atorvastatin  (LIPITOR) 10 MG tablet TAKE 1 TABLET BY MOUTH 2 TIMES A WEEK   Glucosamine-Chondroitin (OSTEO BI-FLEX REGULAR STRENGTH PO) Take 2 tablets by mouth daily.   ibuprofen (ADVIL) 200 MG tablet Take 200 mg by mouth every 6 (six) hours as needed.   Omega-3 Fatty Acids (FISH OIL ULTRA) 1400 MG CAPS Take 1,400 mg by mouth daily.   OVER THE COUNTER MEDICATION Take 2 capsules by mouth daily. Cinsulin (Cinnamon Chromium Picolinate Vitamin D3)   sulfamethoxazole-trimethoprim (BACTRIM DS) 800-160 MG tablet Take 1 tablet by mouth daily.   TURMERIC PO Take 2,000 mg by mouth daily. 1000 MG/CAPSULE   No facility-administered encounter medications on file as of 09/16/2022.    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 He currently does not take nothing for it. He declines meds and is tired.    Vertigo 04/18/2021   Past Surgical History:  Procedure Laterality Date   FRACTURE SURGERY  1978   RIGHT SHOULDER PINNING   HERNIA REPAIR     UMBILICAL 2017, GROIN FEB 1975   I & D  KNEE WITH POLY EXCHANGE Right 08/15/2020   Procedure: RIGHT UNI COMPARTMENTAL KNEE IRRIGATION AND DEBRIDEMENT KNEE WITH POLY EXCHANGE;  Surgeon: [name], MD;  Location: [facility];  Service: Orthopedics;  Laterality: Right;   PARTIAL KNEE ARTHROPLASTY Right 07/29/2019   Procedure: UNICOMPARTMENTAL KNEE Medially;  Surgeon: [name], MD;  Location: [facility];  Service: Orthopedics;  Laterality: Right;  90 mins   RIGHT SHOULDER PINS REMOVED     Family History  Problem Relation Age of Onset   Hyperlipidemia Mother    Mental illness Mother    Social History   Socioeconomic History   Marital status:  Married    Spouse name: Not on file   Number of children: Not on file   Years of education: Not on file   Highest education level: Not on file  Occupational History   Not on file  Tobacco Use   Smoking status: Never   Smokeless tobacco: Never  Vaping Use   Vaping Use: Never used  Substance and Sexual Activity   Alcohol use: No   Drug use: Not Currently    Types: Marijuana   Sexual activity: Not on file  Other Topics Concern   Not on file  Social History Narrative   Not on file   Social Determinants of Health   Financial Resource Strain: Low Risk  (09/16/2022)   Overall Financial Resource Strain (CARDIA)    Difficulty of Paying Living Expenses: Not hard at all  Food Insecurity: No Food Insecurity (09/16/2022)   Hunger Vital Sign    Worried About Running Out of Food in the Last Year: Never true    Ran Out of Food in the Last Year: Never true  Transportation Needs: No Transportation Needs (09/16/2022)   PRAPARE - Administrator, Civil Service (Medical): No    Lack of Transportation (Non-Medical): No  Physical Activity: Sufficiently Active (09/16/2022)   Exercise Vital Sign    Days of Exercise per Week: 7 days    Minutes of Exercise per Session: 60 min  Stress: No Stress Concern Present (09/16/2022)   [employer] of Occupational Health - Occupational Stress Questionnaire    Feeling of Stress : Not at all  Social Connections: Not on file    Tobacco Counseling Counseling given: Not Answered   Clinical Intake:  Pre-visit preparation completed: Yes  Pain : No/denies pain     Nutritional Status: BMI > 30  Obese Nutritional Risks: None Diabetes: No  How often do you need to have someone help you when you read instructions, pamphlets, or other written materials from your doctor or pharmacy?: 1 - Never  Diabetic? no  Interpreter Needed?: No  Information entered by :: NAllen LPN   Activities of Daily Living    09/16/2022   10:20 AM  In your  present state of health, do you have any difficulty performing the following activities:  Hearing? 1  Vision? 0  Difficulty concentrating or making decisions? 0  Walking or climbing stairs? 0  Dressing or bathing? 0  Doing errands, shopping? 0  Preparing Food and eating ? N  Using the Toilet? N  In the past six months, have you accidently leaked urine? N  Do you have problems with loss of bowel control? N  Managing your Medications? N  Managing your Finances? N  Housekeeping or managing your Housekeeping? N    Patient Care Team: Mliss Sax, MD as PCP - General (Family Medicine)  Indicate any recent Medical Services you  may have received from other than [facility] providers in the past year (date may be approximate).     Assessment:   This is a routine wellness examination for [place].  Hearing/Vision screen Vision Screening - Comments:: Regular eye exams,   Dietary issues and exercise activities discussed: Current Exercise Habits: Home exercise routine, Type of exercise: walking, Time (Minutes): 60, Frequency (Times/Week): 7, Weekly Exercise (Minutes/Week): 420   Goals Addressed             This Visit's Progress    Patient Stated       09/16/2022, wants to lose weight       Depression Screen    09/16/2022   10:20 AM 06/13/2022   10:10 AM 03/12/2022    9:33 AM 12/14/2021   10:33 AM 08/31/2021   11:20 AM 06/13/2021    9:36 AM 04/30/2021    8:50 AM  PHQ 2/9 Scores  PHQ - 2 Score 0 0 0 0 0 0 0    Fall Risk    09/16/2022   10:20 AM 06/13/2022   10:10 AM 03/12/2022    9:33 AM 12/14/2021   10:33 AM 08/31/2021   11:20 AM  Fall Risk   Falls in the past year? 0 0 0 0 0  Number falls in past yr: 0 0  0   Injury with Fall? 0      Risk for fall due to : Medication side effect  Orthopedic patient  No Fall Risks  Follow up Falls prevention discussed;Education provided;Falls evaluation completed  Falls evaluation completed  Falls evaluation completed    FALL RISK PREVENTION  PERTAINING TO THE HOME:  Any stairs in or around the home? No  If so, are there any without handrails?  N/a Home free of loose throw rugs in walkways, pet beds, electrical cords, etc? Yes  Adequate lighting in your home to reduce risk of falls? Yes   ASSISTIVE DEVICES UTILIZED TO PREVENT FALLS:  Life alert? No  Use of a cane, walker or w/c? No  Grab bars in the bathroom? No  Shower chair or bench in shower? No  Elevated toilet seat or a handicapped toilet? Yes   TIMED UP AND GO:  Was the test performed? No .      Cognitive Function:        Immunizations Immunization History  Administered Date(s) Administered   Influenza,inj,Quad PF,6+ Mos 10/05/2019, 09/19/2020   Pneumococcal Conjugate-13 07/10/2016    TDAP status: Due, Education has been provided regarding the importance of this vaccine. Advised may receive this vaccine at local pharmacy or Health Dept. Aware to provide a copy of the vaccination record if obtained from local pharmacy or Health Dept. Verbalized acceptance and understanding.  Flu Vaccine status: Declined, Education has been provided regarding the importance of this vaccine but patient still declined. Advised may receive this vaccine at local pharmacy or Health Dept. Aware to provide a copy of the vaccination record if obtained from local pharmacy or Health Dept. Verbalized acceptance and understanding.  Pneumococcal vaccine status: Declined,  Education has been provided regarding the importance of this vaccine but patient still declined. Advised may receive this vaccine at local pharmacy or Health Dept. Aware to provide a copy of the vaccination record if obtained from local pharmacy or Health Dept. Verbalized acceptance and understanding.   Covid-19 vaccine status: Declined, Education has been provided regarding the importance of this vaccine but patient still declined. Advised may receive this vaccine at local pharmacy or Health  Dept.or vaccine clinic. Aware  to provide a copy of the vaccination record if obtained from local pharmacy or Health Dept. Verbalized acceptance and understanding.  Qualifies for Shingles Vaccine? Yes   Zostavax completed No   Shingrix Completed?: No.    Education has been provided regarding the importance of this vaccine. Patient has been advised to call insurance company to determine out of pocket expense if they have not yet received this vaccine. Advised may also receive vaccine at local pharmacy or Health Dept. Verbalized acceptance and understanding.  Screening Tests Health Maintenance  Topic Date Due   Medicare Annual Wellness (AWV)  Never done   FOOT EXAM  Never done   OPHTHALMOLOGY EXAM  11/21/2020   Diabetic kidney evaluation - Urine ACR  05/15/2022   TETANUS/TDAP  06/14/2023 (Originally 09/19/1975)   Zoster Vaccines- Shingrix (1 of 2) 09/14/2023 (Originally 09/18/2006)   HEMOGLOBIN A1C  12/15/2022   Diabetic kidney evaluation - GFR measurement  06/15/2023   COLONOSCOPY (Pts 45-7yrs Insurance coverage will need to be confirmed)  08/26/2024   Hepatitis C Screening  Completed   HIV Screening  Completed   HPV VACCINES  Aged Out   Pneumonia Vaccine 54+ Years old  Discontinued   INFLUENZA VACCINE  Discontinued   COVID-19 Vaccine  Discontinued    Health Maintenance  Health Maintenance Due  Topic Date Due   Medicare Annual Wellness (AWV)  Never done   FOOT EXAM  Never done   OPHTHALMOLOGY EXAM  11/21/2020   Diabetic kidney evaluation - Urine ACR  05/15/2022    Colorectal cancer screening: Type of screening: Colonoscopy. Completed 2021. Repeat every 10 years  Lung Cancer Screening: (Low Dose CT Chest recommended if Age 11-80 years, 30 pack-year currently smoking OR have quit w/in 15years.) does not qualify.   Lung Cancer Screening Referral: no  Additional Screening:  Hepatitis C Screening: does qualify; Completed 10/11/2019  Vision Screening: Recommended annual ophthalmology exams for early detection  of glaucoma and other disorders of the eye. Is the patient up to date with their annual eye exam?  Yes  Who is the provider or what is the name of the office in which the patient attends annual eye exams?  If pt is not established with a provider, would they like to be referred to a provider to establish care? No .   Dental Screening: Recommended annual dental exams for proper oral hygiene  Community Resource Referral / Chronic Care Management: CRR required this visit?  No   CCM required this visit?  No      Plan:     I have personally reviewed and noted the following in the patient's chart:   Medical and social history Use of alcohol, tobacco or illicit drugs  Current medications and supplements including opioid prescriptions. Patient is not currently taking opioid prescriptions. Functional ability and status Nutritional status Physical activity Advanced directives List of other physicians Hospitalizations, surgeries, and ER visits in previous 12 months Vitals Screenings to include cognitive, depression, and falls Referrals and appointments  In addition, I have reviewed and discussed with patient certain preventive protocols, quality metrics, and best practice recommendations. A written personalized care plan for preventive services as well as general preventive health recommendations were provided to patient.     Barb Merino, LPN   16/11/958   Nurse Notes: 6 CIT not administered. Patient states that he has no cognitive issues. Declines  Due to this being a virtual visit, the after visit summary with patients personalized  plan was offered to patient via mail or my-chart. Patient would like to access on my-chart

## 2022-09-24 ENCOUNTER — Other Ambulatory Visit: Payer: Self-pay

## 2022-09-24 ENCOUNTER — Ambulatory Visit (INDEPENDENT_AMBULATORY_CARE_PROVIDER_SITE_OTHER): Payer: Medicare Other | Admitting: Infectious Disease

## 2022-09-24 ENCOUNTER — Encounter: Payer: Self-pay | Admitting: Infectious Disease

## 2022-09-24 VITALS — BP 132/88 | HR 58 | Temp 97.6°F | Ht 68.0 in | Wt 228.0 lb

## 2022-09-24 DIAGNOSIS — T8450XD Infection and inflammatory reaction due to unspecified internal joint prosthesis, subsequent encounter: Secondary | ICD-10-CM | POA: Diagnosis not present

## 2022-09-24 DIAGNOSIS — T8453XD Infection and inflammatory reaction due to internal right knee prosthesis, subsequent encounter: Secondary | ICD-10-CM

## 2022-09-24 NOTE — Progress Notes (Signed)
Subjective:  Chief complaint: Follow-up for prosthetic joint infection  Patient ID: Jesus LaAlan Dinino Sr., male    DOB: 01/12/1956, 66 y.o.   MRN: [id number]  HPI   Jesus Lalan Wivell Sr. is a 66y.o. male  medical history of type 2 diabetes, OSA, right medial unicompartmental knee replacement (UKR) which was complicated by anterior knee erythema and treated with a course of cephalexin September 2020 and recent hospitalization from 10/4-10/7 for MSSA right knee joint infection s/p right UKR irrigation and debridement (08/15/20) and poly-exhange by Dr Charlann Boxerlin . He was initially on vancomycin but changed to Cefazolin and Rifampin per PICC line who began having nausea, paresthesias, pruritis and fever (103.70F) on 09/08/20 after taking 2 doses of his Cefazolin.   IN interim he had developed a macular rash with some petechIal aspects on legs, arms, chest and back.  Blood cultures were negative and cefazolin was stopped along with rifampin.  He also had evidence of a drug-induced liver injury.  We switched him over to daptomycin and his rash has bresolved.  His hepatitis also has been resolvied.  Knee pain had been dramatically improved.  He was doing quite a bit of vigorous physical activity including replacing tile work in flooring and in the bathroom and restoring the bath and that it been torn out.  Apparently  he had a mechanical fall and stressed the knee and he has been hurting ever since then particularly in the last 24 hours.  He unfortunate developed COVID-19 infection shortly before he was going to be seeing Dr. Charlann Boxerlin  One symptoms that he had also developed with COVID but which I did not know about was what sounds like vertiginous symptoms.  He was  having dizziness and blurry vision especially with bending over and standing up quickly or when he lies down in particular.  Initially ascribed to covid but then has not gone away  He tried removing certain medications. He had stopped opiates for pain and  begun using marijuana for pain but stopped this in case this was contributing to his symptoms.  His wife and he had read online and wondered if this could be related to his doxycyline.  I end up referring him to physical therapy and ultimately he did have resolution of what was obviously BPVertigo with PT with 3 treatments.   He did see Marcos EkeGreg Calone and was changed doxycycline to Bactrim.  He has subsequently change the Bactrim to once daily instead of twice daily.  He had some concern about different medications causing headaches and whether they still could be associated with his vertiginous symptoms.  He has stopped his metformin as well.  Since I last saw him he remains on the Bactrim once daily a  Knee is largely improved he is quite active on it actually but still does have pain with is not surprising since he had significant infection and likely damage to the joint.  We again discussed the idea of him coming off of antibiotics but he wanted to not do this until he gets through November of this year when his wife is going to be retiring.  He is very afraid of needing more surgery on his knee.  Explained to him today that the Bactrim dose he is taking is not likely really therapeutic at 1 pill once a day and that it would be better for him to stop it altogether so he can observe how he does off antibiotics have assured him if he has any symptoms to suggest  recurrence of infection that we can reinstitute antibiotics promptly and help him avoid an additional surgery.         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 He currently does not take nothing for it. He declines meds and is tired.    Vertigo 04/18/2021    Past Surgical  History:  Procedure Laterality Date   FRACTURE SURGERY  1978   RIGHT SHOULDER PINNING   HERNIA REPAIR     UMBILICAL 2017, GROIN FEB 1975   I & D KNEE WITH POLY EXCHANGE Right 08/15/2020   Procedure: RIGHT UNI COMPARTMENTAL KNEE IRRIGATION AND DEBRIDEMENT KNEE WITH POLY EXCHANGE;  Surgeon: [name], MD;  Location: [facility];  Service: Orthopedics;  Laterality: Right;   PARTIAL KNEE ARTHROPLASTY Right 07/29/2019   Procedure: UNICOMPARTMENTAL KNEE Medially;  Surgeon: [name], MD;  Location: [facility];  Service: Orthopedics;  Laterality: Right;  90 mins   RIGHT SHOULDER PINS REMOVED      Family History  Problem Relation Age of Onset   Hyperlipidemia Mother    Mental illness Mother       Social History   Socioeconomic History   Marital status: Married    Spouse name: Not on file   Number of children: Not on file   Years of education: Not on file   Highest education level: Not on file  Occupational History   Not on file  Tobacco Use   Smoking status: Never   Smokeless tobacco: Never  Vaping Use   Vaping Use: Never used  Substance and Sexual Activity   Alcohol use: No   Drug use: Not Currently    Types: Marijuana   Sexual activity: Not on file  Other Topics Concern   Not on file  Social History Narrative   Not on file   Social Determinants of Health   Financial Resource Strain: Low Risk  (09/16/2022)   Overall Financial Resource Strain (CARDIA)    Difficulty of Paying Living Expenses: Not hard at all  Food Insecurity: No Food Insecurity (09/16/2022)   Hunger Vital Sign    Worried About Running Out of Food in the Last Year: Never true    Ran Out of Food in the Last Year: Never true  Transportation Needs: No Transportation Needs (09/16/2022)   PRAPARE - Administrator, Civil Service (Medical): No    Lack of Transportation (Non-Medical): No  Physical Activity: Sufficiently Active (09/16/2022)   Exercise Vital Sign    Days of Exercise per Week: 7 days     Minutes of Exercise per Session: 60 min  Stress: No Stress Concern Present (09/16/2022)   [employer] of Occupational Health - Occupational Stress Questionnaire    Feeling of Stress : Not at all  Social Connections: Not on file    Allergies  Allergen Reactions   Cefazolin Shortness Of Breath and Rash    Diffuse rash, chest pain,    Benadryl [Diphenhydramine] Other (See Comments)    He cannot recall what happened. Maybe "took too much."   Rifampin     LFT elevations     Current Outpatient Medications:    aspirin 81 MG EC  tablet, Take 81 mg by mouth daily. Swallow whole., Disp: , Rfl:    atorvastatin (LIPITOR) 10 MG tablet, TAKE 1 TABLET BY MOUTH 2 TIMES A WEEK, Disp: 24 tablet, Rfl: 3   Glucosamine-Chondroitin (OSTEO BI-FLEX REGULAR STRENGTH PO), Take 2 tablets by mouth daily., Disp: , Rfl:    ibuprofen (ADVIL) 200 MG tablet, Take 200 mg by mouth every 6 (six) hours as needed., Disp: , Rfl:    Omega-3 Fatty Acids (FISH OIL ULTRA) 1400 MG CAPS, Take 1,400 mg by mouth daily., Disp: , Rfl:    OVER THE COUNTER MEDICATION, Take 2 capsules by mouth daily. Cinsulin (Cinnamon Chromium Picolinate Vitamin D3), Disp: , Rfl:    sulfamethoxazole-trimethoprim (BACTRIM DS) 800-160 MG tablet, Take 1 tablet by mouth daily., Disp: 30 tablet, Rfl: 0   TURMERIC PO, Take 2,000 mg by mouth daily. 1000 MG/CAPSULE, Disp: , Rfl:   Review of Systems  Constitutional:  Negative for activity change, appetite change, chills, diaphoresis, fatigue, fever and unexpected weight change.  HENT:  Negative for congestion, rhinorrhea, sinus pressure, sneezing, sore throat and trouble swallowing.   Eyes:  Negative for photophobia and visual disturbance.  Respiratory:  Negative for cough, chest tightness, shortness of breath, wheezing and stridor.   Cardiovascular:  Negative for chest pain, palpitations and leg swelling.  Gastrointestinal:  Negative for abdominal distention, abdominal pain, anal bleeding, blood in  stool, constipation, diarrhea, nausea and vomiting.  Genitourinary:  Negative for difficulty urinating, dysuria, flank pain and hematuria.  Musculoskeletal:  Negative for arthralgias, back pain, gait problem, joint swelling and myalgias.  Skin:  Negative for color change, pallor, rash and wound.  Neurological:  Negative for dizziness, tremors, weakness and light-headedness.  Hematological:  Negative for adenopathy. Does not bruise/bleed easily.  Psychiatric/Behavioral:  Negative for agitation, behavioral problems, confusion, decreased concentration, dysphoric mood and sleep disturbance.        Objective:   Physical Exam Constitutional:      Appearance: He is well-developed.  HENT:     Head: Normocephalic and atraumatic.  Eyes:     Conjunctiva/sclera: Conjunctivae normal.  Cardiovascular:     Rate and Rhythm: Normal rate and regular rhythm.  Pulmonary:     Effort: Pulmonary effort is normal. No respiratory distress.     Breath sounds: No wheezing.  Abdominal:     General: There is no distension.     Palpations: Abdomen is soft.  Musculoskeletal:        General: No tenderness. Normal range of motion.     Cervical back: Normal range of motion and neck supple.  Skin:    General: Skin is warm and dry.     Coloration: Skin is not pale.     Findings: No erythema or rash.  Neurological:     General: No focal deficit present.     Mental Status: He is alert and oriented to person, place, and time.  Psychiatric:        Mood and Affect: Mood normal.        Behavior: Behavior normal.        Thought Content: Thought content normal.        Judgment: Judgment normal.   Line is clean dry and intact September 19, 2020:    Right knee with some warmth the incision is dry and clean September 19, 2020:     Right knee September 21, 2021:      Knee  03/21/2021:        Assessment & Plan:  Knee infection with MSSA status post poly exchange IV antibiotics followed by oral antibiotics  for nearly 2 years.  We will recheck sed rate CRP BMP with GFR and CBC with differential.  Would like him then to stop the Bactrim and come back and see me in 2 months time.  He is to monitor for any worsening of pain and let me know immediately and if that is the case we will reinstitute Bactrim.    I spent 33 minutes with the patient including than 50% of the time in face to face counseling of the patient has prosthetic joint infection,  along with review of medical records in preparation for the visit and during the visit and in coordination of his care.

## 2022-09-25 LAB — CBC WITH DIFFERENTIAL/PLATELET
Absolute Monocytes: 510 cells/uL (ref 200–950)
Basophils Absolute: 29 cells/uL (ref 0–200)
Basophils Relative: 0.5 %
Eosinophils Absolute: 516 cells/uL — ABNORMAL HIGH (ref 15–500)
Eosinophils Relative: 8.9 %
HCT: 47.9 % (ref 38.5–50.0)
Hemoglobin: 16 g/dL (ref 13.2–17.1)
Lymphs Abs: 1496 cells/uL (ref 850–3900)
MCH: 30.4 pg (ref 27.0–33.0)
MCHC: 33.4 g/dL (ref 32.0–36.0)
MCV: 91.1 fL (ref 80.0–100.0)
MPV: 9.3 fL (ref 7.5–12.5)
Monocytes Relative: 8.8 %
Neutro Abs: 3248 cells/uL (ref 1500–7800)
Neutrophils Relative %: 56 %
Platelets: 226 10*3/uL (ref 140–400)
RBC: 5.26 10*6/uL (ref 4.20–5.80)
RDW: 12.7 % (ref 11.0–15.0)
Total Lymphocyte: 25.8 %
WBC: 5.8 10*3/uL (ref 3.8–10.8)

## 2022-09-25 LAB — SEDIMENTATION RATE: Sed Rate: 2 mm/h (ref 0–20)

## 2022-09-25 LAB — BASIC METABOLIC PANEL WITH GFR
BUN: 25 mg/dL (ref 7–25)
CO2: 28 mmol/L (ref 20–32)
Calcium: 9.1 mg/dL (ref 8.6–10.3)
Chloride: 106 mmol/L (ref 98–110)
Creat: 0.94 mg/dL (ref 0.70–1.35)
Glucose, Bld: 103 mg/dL — ABNORMAL HIGH (ref 65–99)
Potassium: 4.8 mmol/L (ref 3.5–5.3)
Sodium: 138 mmol/L (ref 135–146)
eGFR: 89 mL/min/{1.73_m2} (ref >=60)

## 2022-09-25 LAB — C-REACTIVE PROTEIN: CRP: 0.6 mg/L (ref <8.0)

## 2022-12-03 ENCOUNTER — Ambulatory Visit (INDEPENDENT_AMBULATORY_CARE_PROVIDER_SITE_OTHER): Payer: Medicare Other | Admitting: Infectious Disease

## 2022-12-03 ENCOUNTER — Other Ambulatory Visit: Payer: Self-pay

## 2022-12-03 ENCOUNTER — Encounter: Payer: Self-pay | Admitting: Infectious Disease

## 2022-12-03 VITALS — BP 114/75 | HR 64 | Temp 97.3°F | Ht 68.0 in | Wt 230.0 lb

## 2022-12-03 DIAGNOSIS — Z96651 Presence of right artificial knee joint: Secondary | ICD-10-CM

## 2022-12-03 DIAGNOSIS — E119 Type 2 diabetes mellitus without complications: Secondary | ICD-10-CM

## 2022-12-03 DIAGNOSIS — A4901 Methicillin susceptible Staphylococcus aureus infection, unspecified site: Secondary | ICD-10-CM

## 2022-12-03 NOTE — Progress Notes (Signed)
Subjective:  Chief complaint: Follow-up for prosthetic joint infection   Patient ID: Jesus Fam Sr., male    DOB: [birth year], 67 y.o.   MRN: [id number]  HPI   Jesus Caselli Sr. is a 67y.o. male  medical history of type 2 diabetes, OSA, right medial unicompartmental knee replacement (UKR) which was complicated by anterior knee erythema and treated with a course of cephalexin September 2020 and recent hospitalization from 10/4-10/7 for MSSA right knee joint infection s/p right UKR irrigation and debridement (08/15/20) and poly-exhange by Dr Alvan Dame . He was initially on vancomycin but changed to Cefazolin and Rifampin per PICC line who began having nausea, paresthesias, pruritis and fever (103.40F) on 09/08/20 after taking 2 doses of his Cefazolin.   IN interim he had developed a macular rash with some petechIal aspects on legs, arms, chest and back.  Blood cultures were negative and cefazolin was stopped along with rifampin.  He also had evidence of a drug-induced liver injury.  We switched him over to daptomycin and his rash has bresolved.  His hepatitis also has been resolvied.  Knee pain had been dramatically improved.  He was doing quite a bit of vigorous physical activity including replacing tile work in flooring and in the bathroom and restoring the bath and that it been torn out.  Apparently  he had a mechanical fall and stressed the knee and he has been hurting ever since then particularly in the last 24 hours.  He unfortunate developed COVID-19 infection shortly before he was going to be seeing [name]  One symptoms that he had also developed with COVID but which I did not know about was what sounds like vertiginous symptoms.  He was  having dizziness and blurry vision especially with bending over and standing up quickly or when he lies down in particular.  Initially ascribed to covid but then has not gone away  He tried removing certain medications. He had stopped opiates for pain and  begun using marijuana for pain but stopped this in case this was contributing to his symptoms.  His wife and he had read online and wondered if this could be related to his doxycyline.  I end up referring him to physical therapy and ultimately he did have resolution of what was obviously BPVertigo with PT with 3 treatments.   He did see Terri Piedra and was changed doxycycline to Bactrim.  He has subsequently change the Bactrim to once daily instead of twice daily.  He had some concern about different medications causing headaches and whether they still could be associated with his vertiginous symptoms.  He has stopped his metformin as well.  Since I last saw him he remains on the Bactrim once daily a  Knee is largely improved he is quite active on it actually but still does have pain with is not surprising since he had significant infection and likely damage to the joint.  We again discussed the idea of him coming off of antibiotics but he wanted to not do this until he gets through November of this year when his wife is going to be retiring.  He is very afraid of needing more surgery on his knee.  AT last visit I explained to him today that the Bactrim dose he is taking is not likely really therapeutic at 1 pill once a day and that it would be better for him to stop it altogether so he can observe how he does off antibiotics have assured him if he  has any symptoms to suggest recurrence of infection that we can reinstitute antibiotics promptly and help him avoid an additional surgery.  Since then he took Bactrim for additional week but has not been on Bactrim for 2 months.  He has not had any worsening of his knee pain in the interim he did have a mechanical fall when he was coming back home and tripped over an object struck his knee as well as twisted his neck.  Beyond this mechanical injury though he has not any worsening pain whatsoever.           Past Medical History:  Diagnosis  Date   Arthritis    OA   COVID-19 virus infection 11/21/2020   Diabetes mellitus without complication (HCC)    TYPE 2   History of colon polyps 2015   Lumbar stress fracture AGE 86   NO SX DONE, HEALED   Seizures (HCC)    2012, possibly related to drug use. He currently does not take nothing for it. He declines meds and is tired.    Vertigo 04/18/2021    Past Surgical History:  Procedure Laterality Date   FRACTURE SURGERY  1978   RIGHT SHOULDER PINNING   HERNIA REPAIR     UMBILICAL 2017, GROIN FEB 1975   I & D KNEE WITH POLY EXCHANGE Right 08/15/2020   Procedure: RIGHT UNI COMPARTMENTAL KNEE IRRIGATION AND DEBRIDEMENT KNEE WITH POLY EXCHANGE;  Surgeon: [name], MD;  Location: [facility];  Service: Orthopedics;  Laterality: Right;   PARTIAL KNEE ARTHROPLASTY Right 07/29/2019   Procedure: UNICOMPARTMENTAL KNEE Medially;  Surgeon: [name], MD;  Location: [facility];  Service: Orthopedics;  Laterality: Right;  90 mins   RIGHT SHOULDER PINS REMOVED      Family History  Problem Relation Age of Onset   Hyperlipidemia Mother    Mental illness Mother       Social History   Socioeconomic History   Marital status: Married    Spouse name: Not on file   Number of children: Not on file   Years of education: Not on file   Highest education level: Not on file  Occupational History   Not on file  Tobacco Use   Smoking status: Never   Smokeless tobacco: Never  Vaping Use   Vaping Use: Never used  Substance and Sexual Activity   Alcohol use: No   Drug use: Not Currently    Types: Marijuana   Sexual activity: Not on file  Other Topics Concern   Not on file  Social History Narrative   Not on file   Social Determinants of Health   Financial Resource Strain: Low Risk  (09/16/2022)   Overall Financial Resource Strain (CARDIA)    Difficulty of Paying Living Expenses: Not hard at all  Food Insecurity: No Food Insecurity (09/16/2022)   Hunger Vital Sign    Worried About Running  Out of Food in the Last Year: Never true    Ran Out of Food in the Last Year: Never true  Transportation Needs: No Transportation Needs (09/16/2022)   PRAPARE - Administrator, Civil Service (Medical): No    Lack of Transportation (Non-Medical): No  Physical Activity: Sufficiently Active (09/16/2022)   Exercise Vital Sign    Days of Exercise per Week: 7 days    Minutes of Exercise per Session: 60 min  Stress: No Stress Concern Present (09/16/2022)   [employer] of Occupational Health - Occupational Stress Questionnaire  Feeling of Stress : Not at all  Social Connections: Not on file    Allergies  Allergen Reactions   Cefazolin Shortness Of Breath and Rash    Diffuse rash, chest pain,    Benadryl [Diphenhydramine] Other (See Comments)    He cannot recall what happened. Maybe "took too much."   Rifampin     LFT elevations     Current Outpatient Medications:    aspirin 81 MG EC tablet, Take 81 mg by mouth daily. Swallow whole., Disp: , Rfl:    atorvastatin (LIPITOR) 10 MG tablet, TAKE 1 TABLET BY MOUTH 2 TIMES A WEEK, Disp: 24 tablet, Rfl: 3   Glucosamine-Chondroitin (OSTEO BI-FLEX REGULAR STRENGTH PO), Take 2 tablets by mouth daily., Disp: , Rfl:    ibuprofen (ADVIL) 200 MG tablet, Take 200 mg by mouth every 6 (six) hours as needed., Disp: , Rfl:    Omega-3 Fatty Acids (FISH OIL ULTRA) 1400 MG CAPS, Take 1,400 mg by mouth daily., Disp: , Rfl:    OVER THE COUNTER MEDICATION, Take 2 capsules by mouth daily. Cinsulin (Cinnamon Chromium Picolinate Vitamin D3), Disp: , Rfl:    TURMERIC PO, Take 2,000 mg by mouth daily. 1000 MG/CAPSULE, Disp: , Rfl:   Review of Systems  Constitutional:  Negative for activity change, appetite change, chills, diaphoresis, fatigue, fever and unexpected weight change.  HENT:  Negative for congestion, rhinorrhea, sinus pressure, sneezing, sore throat and trouble swallowing.   Eyes:  Negative for photophobia and visual disturbance.   Respiratory:  Negative for cough, chest tightness, shortness of breath, wheezing and stridor.   Cardiovascular:  Negative for chest pain, palpitations and leg swelling.  Gastrointestinal:  Negative for abdominal distention, abdominal pain, anal bleeding, blood in stool, constipation, diarrhea, nausea and vomiting.  Genitourinary:  Negative for difficulty urinating, dysuria, flank pain and hematuria.  Musculoskeletal:  Negative for arthralgias, back pain, gait problem, joint swelling and myalgias.  Skin:  Negative for color change, pallor, rash and wound.  Neurological:  Negative for dizziness, tremors, weakness and light-headedness.  Hematological:  Negative for adenopathy. Does not bruise/bleed easily.  Psychiatric/Behavioral:  Negative for agitation, behavioral problems, confusion, decreased concentration, dysphoric mood and sleep disturbance.        Objective:   Physical Exam Constitutional:      Appearance: He is well-developed.  HENT:     Head: Normocephalic and atraumatic.  Eyes:     Conjunctiva/sclera: Conjunctivae normal.  Cardiovascular:     Rate and Rhythm: Normal rate and regular rhythm.  Pulmonary:     Effort: Pulmonary effort is normal. No respiratory distress.     Breath sounds: No wheezing.  Abdominal:     General: There is no distension.     Palpations: Abdomen is soft.  Musculoskeletal:     Cervical back: Normal range of motion and neck supple.  Skin:    General: Skin is warm and dry.     Coloration: Skin is not pale.     Findings: No erythema or rash.  Neurological:     General: No focal deficit present.     Mental Status: He is alert and oriented to person, place, and time.  Psychiatric:        Mood and Affect: Mood normal.        Behavior: Behavior normal.        Thought Content: Thought content normal.        Judgment: Judgment normal.   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  Recheck sed rate CRP BMP with GFR CBC with differential.  I plan on having come back to clinic in 6 months time observe how he is doing off antibiotics.  DM: following with PCP

## 2022-12-04 ENCOUNTER — Telehealth: Payer: Self-pay

## 2022-12-04 LAB — C-REACTIVE PROTEIN: CRP: 0.8 mg/L (ref <8.0)

## 2022-12-04 LAB — BASIC METABOLIC PANEL WITH GFR
BUN: 25 mg/dL (ref 7–25)
CO2: 29 mmol/L (ref 20–32)
Calcium: 9.2 mg/dL (ref 8.6–10.3)
Chloride: 104 mmol/L (ref 98–110)
Creat: 0.99 mg/dL (ref 0.70–1.35)
Glucose, Bld: 111 mg/dL — ABNORMAL HIGH (ref 65–99)
Potassium: 5.1 mmol/L (ref 3.5–5.3)
Sodium: 139 mmol/L (ref 135–146)
eGFR: 84 mL/min/{1.73_m2} (ref >=60)

## 2022-12-04 LAB — CBC WITH DIFFERENTIAL/PLATELET
Absolute Monocytes: 488 cells/uL (ref 200–950)
Basophils Absolute: 39 cells/uL (ref 0–200)
Basophils Relative: 0.6 %
Eosinophils Absolute: 397 cells/uL (ref 15–500)
Eosinophils Relative: 6.1 %
HCT: 48.2 % (ref 38.5–50.0)
Hemoglobin: 16.4 g/dL (ref 13.2–17.1)
Lymphs Abs: 1840 cells/uL (ref 850–3900)
MCH: 30.7 pg (ref 27.0–33.0)
MCHC: 34 g/dL (ref 32.0–36.0)
MCV: 90.3 fL (ref 80.0–100.0)
MPV: 9.1 fL (ref 7.5–12.5)
Monocytes Relative: 7.5 %
Neutro Abs: 3738 cells/uL (ref 1500–7800)
Neutrophils Relative %: 57.5 %
Platelets: 227 10*3/uL (ref 140–400)
RBC: 5.34 10*6/uL (ref 4.20–5.80)
RDW: 12.5 % (ref 11.0–15.0)
Total Lymphocyte: 28.3 %
WBC: 6.5 10*3/uL (ref 3.8–10.8)

## 2022-12-04 LAB — SEDIMENTATION RATE: Sed Rate: 2 mm/h (ref 0–20)

## 2022-12-04 NOTE — Telephone Encounter (Signed)
-----  Message from Truman Hayward, MD sent at 12/04/2022  9:10 AM EST ----- Jesus Nunez's ESR and CRP are completely normal. Good news! ----- Message ----- From: Cheyenne Adas Lab Results In Sent: 12/03/2022   2:42 PM EST To: Truman Hayward, MD

## 2022-12-05 NOTE — Telephone Encounter (Signed)
Spoke with Jesus Nunez, relayed that inflammatory markers were normal. Patient verbalized understanding and has no further questions.   Beryle Flock, RN

## 2023-03-28 ENCOUNTER — Other Ambulatory Visit: Payer: Self-pay | Admitting: Family Medicine

## 2023-03-28 DIAGNOSIS — E119 Type 2 diabetes mellitus without complications: Secondary | ICD-10-CM

## 2023-04-02 ENCOUNTER — Other Ambulatory Visit: Payer: Self-pay

## 2023-04-02 DIAGNOSIS — E119 Type 2 diabetes mellitus without complications: Secondary | ICD-10-CM

## 2023-04-02 MED ORDER — ATORVASTATIN CALCIUM 10 MG PO TABS: 10.0000 mg | ORAL_TABLET | Freq: Every day | ORAL | 3 refills | Status: AC

## 2023-06-25 ENCOUNTER — Ambulatory Visit: Payer: Medicare Other | Admitting: Infectious Disease

## 2023-08-18 ENCOUNTER — Ambulatory Visit (INDEPENDENT_AMBULATORY_CARE_PROVIDER_SITE_OTHER): Payer: Medicare Other

## 2023-08-18 DIAGNOSIS — Z0000 Encounter for general adult medical examination without abnormal findings: Secondary | ICD-10-CM | POA: Diagnosis not present

## 2023-08-18 LAB — HM DIABETES EYE EXAM

## 2023-08-18 NOTE — Patient Instructions (Signed)
Mr. Ivins , Thank you for taking time to come for your Medicare Wellness Visit. I appreciate your ongoing commitment to your health goals. Please review the following plan we discussed and let me know if I can assist you in the future.   Referrals/Orders/Follow-Ups/Clinician Recommendations: none  This is a list of the screening recommended for you and due dates:  Health Maintenance  Topic Date Due   Complete foot exam   Never done   DTaP/Tdap/Td vaccine (1 - Tdap) Never done   Eye exam for diabetics  11/21/2020   Yearly kidney health urinalysis for diabetes  05/15/2022   Hemoglobin A1C  12/15/2022   Zoster (Shingles) Vaccine (1 of 2) 09/14/2023*   Yearly kidney function blood test for diabetes  12/04/2023   Medicare Annual Wellness Visit  08/17/2024   Colon Cancer Screening  08/26/2024   Hepatitis C Screening  Completed   HPV Vaccine  Aged Out   Pneumonia Vaccine  Discontinued   Flu Shot  Discontinued   COVID-19 Vaccine  Discontinued  *Topic was postponed. The date shown is not the original due date.    Advanced directives: (Copy Requested) Please bring a copy of your health care power of attorney and living will to the office to be added to your chart at your convenience.  Next Medicare Annual Wellness Visit scheduled for next year: Yes  insert Preventive Care attachment Insert FALL PREVENTION attachment if needed

## 2023-08-18 NOTE — Progress Notes (Signed)
Subjective:   Jesus Brandis Sr. is a 67 y.o. male who presents for Medicare Annual/Subsequent preventive examination.  Visit Complete: Virtual  I connected with  Jesus Mintzer Sr. on 08/18/23 by a audio enabled telemedicine application and verified that I am speaking with the correct person using two identifiers.  Patient Location: Home  Provider Location: Office/Clinic  I discussed the limitations of evaluation and management by telemedicine. The patient expressed understanding and agreed to proceed.  Because this visit was a virtual/telehealth visit, some criteria may be missing or patient reported. Any vitals not documented were not able to be obtained and vitals that have been documented are patient reported.   Cardiac Risk Factors include: advanced age (>82men, >32 women);male gender     Objective:    Today's Vitals   There is no height or weight on file to calculate BMI.     08/18/2023    4:23 PM 09/16/2022   10:18 AM 04/20/2021    8:04 AM 10/04/2020   11:04 AM 09/09/2020    3:50 PM 09/09/2020    9:22 AM 08/15/2020    3:14 PM  Advanced Directives  Does Patient Have a Medical Advance Directive? Yes No No No No No No  Type of Advance Directive Living will        Would patient like information on creating a medical advance directive?   No - Patient declined No - Patient declined No - Patient declined  No - Patient declined    Current Medications (verified) Outpatient Encounter Medications as of 08/18/2023  Medication Sig   aspirin 81 MG EC tablet Take 81 mg by mouth daily. Swallow whole.   atorvastatin (LIPITOR) 10 MG tablet Take 1 tablet (10 mg total) by mouth daily. (Patient taking differently: Take 10 mg by mouth daily. Takes twice weekly)   Glucosamine-Chondroitin (OSTEO BI-FLEX REGULAR STRENGTH PO) Take 2 tablets by mouth daily.   ibuprofen (ADVIL) 200 MG tablet Take 200 mg by mouth every 6 (six) hours as needed.   Omega-3 Fatty Acids (FISH OIL ULTRA) 1400 MG CAPS Take  1,400 mg by mouth daily.   OVER THE COUNTER MEDICATION Take 2 capsules by mouth daily. Cinsulin (Cinnamon Chromium Picolinate Vitamin D3)   TURMERIC PO Take 2,000 mg by mouth daily. 1000 MG/CAPSULE   No facility-administered encounter medications on file as of 08/18/2023.    Allergies (verified) Cefazolin, Benadryl [diphenhydramine], and Rifampin   History: Past Medical History:  Diagnosis Date   Arthritis    OA   COVID-19 virus infection 11/21/2020   Diabetes mellitus without complication (HCC)    TYPE 2   History of colon polyps 2015   Lumbar stress fracture AGE 27   NO SX DONE, HEALED   Seizures (HCC)    2012, possibly related to drug use. He currently does not take nothing for it. He declines meds and is tired.    Vertigo 04/18/2021   Past Surgical History:  Procedure Laterality Date   FRACTURE SURGERY  1978   RIGHT SHOULDER PINNING   HERNIA REPAIR     UMBILICAL 2017, GROIN FEB 1975   I & D KNEE WITH POLY EXCHANGE Right 08/15/2020   Procedure: RIGHT UNI COMPARTMENTAL KNEE IRRIGATION AND DEBRIDEMENT KNEE WITH POLY EXCHANGE;  Surgeon: [name], MD;  Location: [facility];  Service: Orthopedics;  Laterality: Right;   PARTIAL KNEE ARTHROPLASTY Right 07/29/2019   Procedure: UNICOMPARTMENTAL KNEE Medially;  Surgeon: [name], MD;  Location: [facility];  Service: Orthopedics;  Laterality: Right;  90 mins   RIGHT SHOULDER PINS REMOVED     Family History  Problem Relation Age of Onset   Hyperlipidemia Mother    Mental illness Mother    Social History   Socioeconomic History   Marital status: Married    Spouse name: Not on file   Number of children: Not on file   Years of education: Not on file   Highest education level: Not on file  Occupational History   Not on file  Tobacco Use   Smoking status: Never   Smokeless tobacco: Never  Vaping Use   Vaping status: Never Used  Substance and Sexual Activity   Alcohol use: No   Drug use: Not Currently    Types: Marijuana    Sexual activity: Not on file  Other Topics Concern   Not on file  Social History Narrative   Not on file   Social Determinants of Health   Financial Resource Strain: Low Risk  (08/18/2023)   Overall Financial Resource Strain (CARDIA)    Difficulty of Paying Living Expenses: Not hard at all  Food Insecurity: No Food Insecurity (08/18/2023)   Hunger Vital Sign    Worried About Running Out of Food in the Last Year: Never true    Ran Out of Food in the Last Year: Never true  Transportation Needs: No Transportation Needs (08/18/2023)   PRAPARE - Administrator, Civil Service (Medical): No    Lack of Transportation (Non-Medical): No  Physical Activity: Sufficiently Active (08/18/2023)   Exercise Vital Sign    Days of Exercise per Week: 7 days    Minutes of Exercise per Session: 60 min  Stress: No Stress Concern Present (08/18/2023)   [employer] of Occupational Health - Occupational Stress Questionnaire    Feeling of Stress : Not at all  Social Connections: Moderately Isolated (08/18/2023)   Social Connection and Isolation Panel [NHANES]    Frequency of Communication with Friends and Family: More than three times a week    Frequency of Social Gatherings with Friends and Family: Twice a week    Attends Religious Services: Never    Database administrator or Organizations: No    Attends Engineer, structural: Never    Marital Status: Married    Tobacco Counseling Counseling given: Not Answered   Clinical Intake:  Pre-visit preparation completed: Yes  Pain : No/denies pain     Nutritional Risks: None Diabetes: No  How often do you need to have someone help you when you read instructions, pamphlets, or other written materials from your doctor or pharmacy?: 1 - Never  Interpreter Needed?: No  Information entered by :: NAllen LPN   Activities of Daily Living    08/18/2023    4:14 PM 09/16/2022   10:20 AM  In your present state of health, do you have  any difficulty performing the following activities:  Hearing? 0 1  Vision? 0 0  Comment getting new glasses   Difficulty concentrating or making decisions? 0 0  Walking or climbing stairs? 0 0  Dressing or bathing? 0 0  Doing errands, shopping? 0 0  Preparing Food and eating ? N N  Using the Toilet? N N  In the past six months, have you accidently leaked urine? N N  Do you have problems with loss of bowel control? N N  Managing your Medications? N N  Managing your Finances? N N  Housekeeping or managing your Housekeeping? N N  Patient Care Team: Mliss Sax, MD as PCP - General (Family Medicine)  Indicate any recent Medical Services you may have received from other than [facility] providers in the past year (date may be approximate).     Assessment:   This is a routine wellness examination for Jesus Nunez.  Hearing/Vision screen Hearing Screening - Comments:: Denies real hearing issues Vision Screening - Comments:: No regular eye exams   Goals Addressed             This Visit's Progress    Patient Stated       08/18/2023, lose some weight       Depression Screen    08/18/2023    4:26 PM 12/03/2022    9:42 AM 09/24/2022    9:36 AM 09/16/2022   10:20 AM 06/13/2022   10:10 AM 03/12/2022    9:33 AM 12/14/2021   10:33 AM  PHQ 2/9 Scores  PHQ - 2 Score 0 0 0 0 0 0 0    Fall Risk    08/18/2023    4:25 PM 12/03/2022    9:42 AM 09/24/2022    9:36 AM 09/16/2022   10:20 AM 06/13/2022   10:10 AM  Fall Risk   Falls in the past year? 0 0 0 0 0  Number falls in past yr: 0 0 0 0 0  Injury with Fall? 0 0 0 0   Risk for fall due to : No Fall Risks No Fall Risks No Fall Risks Medication side effect   Follow up Falls prevention discussed;Falls evaluation completed Falls evaluation completed Falls evaluation completed Falls prevention discussed;Education provided;Falls evaluation completed     MEDICARE RISK AT HOME: Medicare Risk at Home Any stairs in or around the home?:  Yes If so, are there any without handrails?: No Home free of loose throw rugs in walkways, pet beds, electrical cords, etc?: Yes Adequate lighting in your home to reduce risk of falls?: Yes Life alert?: No Use of a cane, walker or w/c?: No Grab bars in the bathroom?: No Shower chair or bench in shower?: No Elevated toilet seat or a handicapped toilet?: Yes  TIMED UP AND GO:  Was the test performed?  No    Cognitive Function:        08/18/2023    4:27 PM  6CIT Screen  What Year? 0 points  What month? 0 points  What time? 0 points  Count back from 20 0 points  Months in reverse 0 points  Repeat phrase 2 points  Total Score 2 points    Immunizations Immunization History  Administered Date(s) Administered   Influenza,inj,Quad PF,6+ Mos 10/05/2019, 09/19/2020   Pneumococcal Conjugate-13 07/10/2016    TDAP status: Due, Education has been provided regarding the importance of this vaccine. Advised may receive this vaccine at local pharmacy or Health Dept. Aware to provide a copy of the vaccination record if obtained from local pharmacy or Health Dept. Verbalized acceptance and understanding.  Flu Vaccine status: Declined, Education has been provided regarding the importance of this vaccine but patient still declined. Advised may receive this vaccine at local pharmacy or Health Dept. Aware to provide a copy of the vaccination record if obtained from local pharmacy or Health Dept. Verbalized acceptance and understanding.  Pneumococcal vaccine status: Declined,  Education has been provided regarding the importance of this vaccine but patient still declined. Advised may receive this vaccine at local pharmacy or Health Dept. Aware to provide a copy of the vaccination record if  obtained from local pharmacy or Health Dept. Verbalized acceptance and understanding.   Covid-19 vaccine status: Declined, Education has been provided regarding the importance of this vaccine but patient still  declined. Advised may receive this vaccine at local pharmacy or Health Dept.or vaccine clinic. Aware to provide a copy of the vaccination record if obtained from local pharmacy or Health Dept. Verbalized acceptance and understanding.  Qualifies for Shingles Vaccine? Yes   Zostavax completed No   Shingrix Completed?: No.    Education has been provided regarding the importance of this vaccine. Patient has been advised to call insurance company to determine out of pocket expense if they have not yet received this vaccine. Advised may also receive vaccine at local pharmacy or Health Dept. Verbalized acceptance and understanding.  Screening Tests Health Maintenance  Topic Date Due   FOOT EXAM  Never done   DTaP/Tdap/Td (1 - Tdap) Never done   OPHTHALMOLOGY EXAM  11/21/2020   Diabetic kidney evaluation - Urine ACR  05/15/2022   HEMOGLOBIN A1C  12/15/2022   Zoster Vaccines- Shingrix (1 of 2) 09/14/2023 (Originally 09/18/2006)   Diabetic kidney evaluation - eGFR measurement  12/04/2023   Medicare Annual Wellness (AWV)  08/17/2024   Colonoscopy  08/26/2024   Hepatitis C Screening  Completed   HPV VACCINES  Aged Out   Pneumonia Vaccine 82+ Years old  Discontinued   INFLUENZA VACCINE  Discontinued   COVID-19 Vaccine  Discontinued    Health Maintenance  Health Maintenance Due  Topic Date Due   FOOT EXAM  Never done   DTaP/Tdap/Td (1 - Tdap) Never done   OPHTHALMOLOGY EXAM  11/21/2020   Diabetic kidney evaluation - Urine ACR  05/15/2022   HEMOGLOBIN A1C  12/15/2022    Colorectal cancer screening: Type of screening: Colonoscopy. Completed 2020. Repeat every 10 years  Lung Cancer Screening: (Low Dose CT Chest recommended if Age 43-80 years, 20 pack-year currently smoking OR have quit w/in 15years.) does not qualify.   Lung Cancer Screening Referral: no  Additional Screening:  Hepatitis C Screening: does qualify; Completed 10/11/2019  Vision Screening: Recommended annual ophthalmology  exams for early detection of glaucoma and other disorders of the eye. Is the patient up to date with their annual eye exam?  Yes  Who is the provider or what is the name of the office in which the patient attends annual eye exams? none If pt is not established with a provider, would they like to be referred to a provider to establish care? No .   Dental Screening: Recommended annual dental exams for proper oral hygiene  Diabetic Foot Exam: n/a  Community Resource Referral / Chronic Care Management: CRR required this visit?  No   CCM required this visit?  No     Plan:     I have personally reviewed and noted the following in the patient's chart:   Medical and social history Use of alcohol, tobacco or illicit drugs  Current medications and supplements including opioid prescriptions. Patient is not currently taking opioid prescriptions. Functional ability and status Nutritional status Physical activity Advanced directives List of other physicians Hospitalizations, surgeries, and ER visits in previous 12 months Vitals Screenings to include cognitive, depression, and falls Referrals and appointments  In addition, I have reviewed and discussed with patient certain preventive protocols, quality metrics, and best practice recommendations. A written personalized care plan for preventive services as well as general preventive health recommendations were provided to patient.     Barb Merino, LPN  08/18/2023   After Visit Summary: (MyChart) Due to this being a telephonic visit, the after visit summary with patients personalized plan was offered to patient via MyChart   Nurse Notes: none

## 2023-09-03 ENCOUNTER — Other Ambulatory Visit: Payer: Self-pay

## 2023-09-03 DIAGNOSIS — R7303 Prediabetes: Secondary | ICD-10-CM

## 2023-09-11 DIAGNOSIS — J329 Chronic sinusitis, unspecified: Secondary | ICD-10-CM | POA: Diagnosis not present

## 2023-09-11 DIAGNOSIS — R0982 Postnasal drip: Secondary | ICD-10-CM | POA: Diagnosis not present

## 2023-09-11 DIAGNOSIS — H6990 Unspecified Eustachian tube disorder, unspecified ear: Secondary | ICD-10-CM | POA: Diagnosis not present

## 2023-09-11 DIAGNOSIS — J029 Acute pharyngitis, unspecified: Secondary | ICD-10-CM | POA: Diagnosis not present

## 2023-09-25 ENCOUNTER — Encounter: Payer: Self-pay | Admitting: Family Medicine

## 2023-09-25 NOTE — Telephone Encounter (Signed)
Care team updated and letter sent for eye exam notes.

## 2023-10-20 ENCOUNTER — Ambulatory Visit: Payer: Medicare Other | Admitting: Family Medicine

## 2023-11-12 ENCOUNTER — Encounter: Payer: Self-pay | Admitting: Infectious Disease

## 2023-11-18 ENCOUNTER — Other Ambulatory Visit: Payer: Self-pay

## 2023-11-18 MED ORDER — AMOXICILLIN-POT CLAVULANATE 875-125 MG PO TABS: 1.0000 | ORAL_TABLET | Freq: Two times a day (BID) | ORAL | 0 refills | Status: AC

## 2023-11-18 NOTE — Telephone Encounter (Signed)
 Patient stated he tolerated Augmentin well. RX sent to [facility] in [place].

## 2023-11-18 NOTE — Telephone Encounter (Signed)
   High Allergy/Contraindication: amoxicillin -clavulanateReactions: Shortness Of Breath, Rash. No reaction type specified. User documented allergy severity: High. Cross-sensitive Class Match with CEFAZOLIN  (Class: CEPHALOSPORINS). Diffuse rash, chest pain,    Please advise if okay to send in Augmentin .

## 2023-11-18 NOTE — Telephone Encounter (Signed)
 It looks like he has taken Augmentin  in October 2024 for 7 days. You could speak with him and confirm if he tolerated it. Also looks as though he was prescribed it on two other occasions a few years ago as well. Usually risk for cross-reactivity between cephalosporins and penicillins are relatively low.

## 2024-01-06 DIAGNOSIS — R52 Pain, unspecified: Secondary | ICD-10-CM | POA: Diagnosis not present

## 2024-01-06 DIAGNOSIS — R5383 Other fatigue: Secondary | ICD-10-CM | POA: Diagnosis not present

## 2024-01-06 DIAGNOSIS — R5381 Other malaise: Secondary | ICD-10-CM | POA: Diagnosis not present

## 2024-11-23 ENCOUNTER — Ambulatory Visit: Payer: Self-pay

## 2024-11-23 NOTE — Telephone Encounter (Signed)
 Appointment schedule 01/04/25. Dm/cma

## 2024-11-23 NOTE — Telephone Encounter (Signed)
 FYI Only or Action Required?: FYI only for provider: appointment scheduled on 2.26.26- pt requested.  Patient was last seen in primary care on 06/13/2022 by Berneta Elsie Sayre, MD.  Called Nurse Triage reporting Hair/Scalp Problem.  Symptoms began several months ago.  Interventions attempted: Nothing.  Symptoms are: gradually worsening.  Triage Disposition: See PCP Within 2 Weeks  Patient/caregiver understands and will follow disposition?:      Copied from CRM [id number]. Topic: Clinical - Red Word Triage >> Nov 23, 2024 10:43 AM Deleta RAMAN wrote: Red Word that prompted transfer to Nurse Triage: pt calling to schedule states he believe he has a thyroid issue. numbness in fingers and no energy most days. haven't seen pcp over a year/2 Reason for Disposition  [1] Patch of hair loss AND [2] cause not known  Answer Assessment - Initial Assessment Questions RN asked pt how to help patient today, patient immediately became agitated and stated that he is just trying to make an appt with his doctor and the lady before berated him with questions and then sent him to RN. RN attempted to advise of the process, pt interrupted stating is it really this hard to just make an appt. RN apologized and again tried to explain process, patient yelled that this is ridiculous. Pt continued to yell at patient. RN advised trying to help patient, not need for him to continue to yell at RN. RN scheduled appt for patient. After scheduling he states that he is wanting blood work completed. He has googled his symptoms and believes he has something wrong with his thyroid. He states since August of last year he has been having some rand places of bald spots. It started out the size of a nickle and now is the size of a palm. He is gaining weight even with burning off more calories than he is taking in. Having weakness, fatigue, stiffness.  Pt would prefer to have the blood work prior to appt so it can be discussed at appt.  Rn advised will send a message to Dr. Berneta and his staff to see if that is possible. Rn advised if it is, they will call him to inform him. If no response from us  that means it will be decided at his appointment. Pt stated understanding.  Triage questions not asked due to pts initial behavior. Answered what was freely given.       1. LOCATION: Where is the hair loss? (e.g., all of scalp, parts of scalp, back of head or neck)      2. DESCRIPTION: What does it look like? (e.g., thinning of hair, balding, patches of hair missing)     Patches of hair missing 3. ONSET: When did the hair loss begin? (e.g., sudden or gradual onset; days, weeks, months or years ago)     Gradual since August of 2025 4. OTHER SYMPTOMS: What does the scalp look like where the hair is missing? (e.g., normal, redness, crusts, scarring)      5. OTHER FACTORS: Have you had any of the following recently: childbirth, severe illness or injury, major surgery, major weight loss, cancer chemo, tight hair braids, serious stress?      6. CAUSE: What do you think is causing the hair loss?  Protocols used: Hair Loss-A-AH

## 2024-11-23 NOTE — Telephone Encounter (Unsigned)
 Copied from CRM [id number]. Topic: General - Other >> Nov 23, 2024 11:40 AM Burnard DEL wrote: Reason for CRM: Patient called back in very upset that when he seen his appointment on mychart it was scheduled wrong.Patient stated that he only wanted to be scheduled for an annual physical with  PCP. I reschedule patients appointment to a  Physical. Patient did not want any other issues to be addressed.

## 2025-01-06 ENCOUNTER — Ambulatory Visit: Admitting: Family Medicine

## 2025-01-13 ENCOUNTER — Encounter: Admitting: Family Medicine
